# Patient Record
Sex: Female | Born: 1960 | Race: White | Hispanic: No | Marital: Single | Smoking: Former smoker
Health system: Southern US, Community
[De-identification: ages and names within clinical notes are randomized; demographics above are authoritative.]

## PROBLEM LIST (undated history)

## (undated) DIAGNOSIS — G2581 Restless legs syndrome: Secondary | ICD-10-CM

## (undated) DIAGNOSIS — J309 Allergic rhinitis, unspecified: Secondary | ICD-10-CM

## (undated) DIAGNOSIS — T7840XA Allergy, unspecified, initial encounter: Secondary | ICD-10-CM

## (undated) DIAGNOSIS — K219 Gastro-esophageal reflux disease without esophagitis: Secondary | ICD-10-CM

## (undated) DIAGNOSIS — E119 Type 2 diabetes mellitus without complications: Secondary | ICD-10-CM

## (undated) DIAGNOSIS — E039 Hypothyroidism, unspecified: Secondary | ICD-10-CM

## (undated) DIAGNOSIS — F909 Attention-deficit hyperactivity disorder, unspecified type: Secondary | ICD-10-CM

## (undated) DIAGNOSIS — E1140 Type 2 diabetes mellitus with diabetic neuropathy, unspecified: Secondary | ICD-10-CM

## (undated) DIAGNOSIS — I10 Essential (primary) hypertension: Secondary | ICD-10-CM

## (undated) DIAGNOSIS — K259 Gastric ulcer, unspecified as acute or chronic, without hemorrhage or perforation: Secondary | ICD-10-CM

## (undated) HISTORY — DX: Hypothyroidism, unspecified: E03.9

## (undated) HISTORY — DX: Restless legs syndrome: G25.81

## (undated) HISTORY — DX: Allergy, unspecified, initial encounter: T78.40XA

## (undated) HISTORY — DX: Gastro-esophageal reflux disease without esophagitis: K21.9

## (undated) HISTORY — DX: Attention-deficit hyperactivity disorder, unspecified type: F90.9

## (undated) HISTORY — DX: Type 2 diabetes mellitus without complications: E11.9

## (undated) HISTORY — DX: Gastric ulcer, unspecified as acute or chronic, without hemorrhage or perforation: K25.9

## (undated) HISTORY — DX: Allergic rhinitis, unspecified: J30.9

## (undated) HISTORY — DX: Essential (primary) hypertension: I10

## (undated) HISTORY — DX: Type 2 diabetes mellitus with diabetic neuropathy, unspecified: E11.40

---

## 1980-01-21 HISTORY — PX: CHOLECYSTECTOMY: SHX55

## 2000-01-21 HISTORY — PX: ABDOMINAL HYSTERECTOMY: SHX81

## 2005-01-20 LAB — HM COLONOSCOPY: HM Colonoscopy: NORMAL

## 2007-01-21 HISTORY — PX: ACHILLES TENDON REPAIR: SUR1153

## 2009-01-20 HISTORY — PX: ROTATOR CUFF REPAIR: SHX139

## 2013-08-20 LAB — HM MAMMOGRAPHY: HM MAMMO: NORMAL

## 2013-12-10 ENCOUNTER — Other Ambulatory Visit (HOSPITAL_BASED_OUTPATIENT_CLINIC_OR_DEPARTMENT_OTHER): Payer: Self-pay | Admitting: Family Medicine

## 2013-12-10 ENCOUNTER — Ambulatory Visit (HOSPITAL_BASED_OUTPATIENT_CLINIC_OR_DEPARTMENT_OTHER)
Admission: RE | Admit: 2013-12-10 | Discharge: 2013-12-10 | Disposition: A | Discharge status: Home / Self Care | Payer: Managed Care, Other (non HMO) | Source: Ambulatory Visit | Attending: Family Medicine | Admitting: Family Medicine

## 2013-12-10 DIAGNOSIS — X58XXXA Exposure to other specified factors, initial encounter: Secondary | ICD-10-CM | POA: Insufficient documentation

## 2013-12-10 DIAGNOSIS — R52 Pain, unspecified: Secondary | ICD-10-CM

## 2013-12-10 DIAGNOSIS — M7989 Other specified soft tissue disorders: Secondary | ICD-10-CM | POA: Diagnosis present

## 2013-12-10 DIAGNOSIS — R609 Edema, unspecified: Secondary | ICD-10-CM | POA: Diagnosis not present

## 2015-03-09 ENCOUNTER — Telehealth: Payer: Self-pay | Admitting: *Deleted

## 2015-03-09 ENCOUNTER — Encounter: Payer: Self-pay | Admitting: *Deleted

## 2015-03-09 NOTE — Telephone Encounter (Signed)
Called pt back as requested regarding cost of flu shot. LMOVM per pt request informing her (per [place]) that out of pocket cost for flu shot is roughly $62.

## 2015-03-09 NOTE — Telephone Encounter (Signed)
Pre-Visit Call completed with patient and chart updated.   Pre-Visit Info documented in Specialty Comments under Snap[employer]ot.    Cristina Burns-FYI, new pt for 03/12/15 @ 2:30pm. Multiple meds and significant PMH, PSH, and FH. Sees endocrinology for DM management, unclear who is managing hypothyroid. Pt is self-pay.

## 2015-03-12 ENCOUNTER — Encounter: Payer: Self-pay | Admitting: Physician Assistant

## 2015-03-12 ENCOUNTER — Ambulatory Visit (INDEPENDENT_AMBULATORY_CARE_PROVIDER_SITE_OTHER): Payer: Self-pay | Admitting: Physician Assistant

## 2015-03-12 VITALS — BP 130/82 | HR 87 | Temp 98.1°F

## 2015-03-12 DIAGNOSIS — E039 Hypothyroidism, unspecified: Secondary | ICD-10-CM

## 2015-03-12 DIAGNOSIS — I10 Essential (primary) hypertension: Secondary | ICD-10-CM

## 2015-03-12 DIAGNOSIS — E1142 Type 2 diabetes mellitus with diabetic polyneuropathy: Secondary | ICD-10-CM

## 2015-03-12 DIAGNOSIS — Z794 Long term (current) use of insulin: Secondary | ICD-10-CM

## 2015-03-12 MED ORDER — METHYLPHENIDATE HCL ER 36 MG PO TB24: 36.0000 mg | ORAL_TABLET | Freq: Every day | ORAL | Status: DC

## 2015-03-12 MED ORDER — ROPINIROLE HCL 0.5 MG PO TABS: ORAL_TABLET | ORAL | Status: DC

## 2015-03-12 MED ORDER — PANTOPRAZOLE SODIUM 40 MG PO TBEC: 40.0000 mg | DELAYED_RELEASE_TABLET | Freq: Two times a day (BID) | ORAL | Status: DC

## 2015-03-12 MED ORDER — LEVOTHYROXINE SODIUM 125 MCG PO TABS: 62.5000 ug | ORAL_TABLET | Freq: Every day | ORAL | Status: DC

## 2015-03-12 MED ORDER — AMLODIPINE BESYLATE 5 MG PO TABS: 5.0000 mg | ORAL_TABLET | Freq: Every day | ORAL | Status: DC

## 2015-03-12 MED ORDER — TRIAMTERENE-HCTZ 37.5-25 MG PO CAPS
1.0000 | ORAL_CAPSULE | Freq: Every day | ORAL | Status: DC
Start: 2015-03-12 — End: 2016-06-02

## 2015-03-12 MED ORDER — LEVOTHYROXINE SODIUM 137 MCG PO TABS: 68.5000 ug | ORAL_TABLET | Freq: Every day | ORAL | Status: DC

## 2015-03-12 NOTE — Patient Instructions (Signed)
Please continue medications as directed.  Follow-up with your Endocrinologist as scheduled in the next few weeks. Since they are drawing labs I will try to cut down on your cost since you do not have insurance. Have them check your thyroid and fax all lab results to me so I can place in your file.   Follow-up with me in a month. If the specialist has not drawn labs, we will have to check them at next visit.

## 2015-03-12 NOTE — Progress Notes (Signed)
Patient presents to clinic today to establish care.  Chronic Issues: Hypertension -- Currently on regimen of Dyazide and Amlodipine. Patient denies chest pain, palpitations, lightheadedness, dizziness, vision changes or frequent headaches.  BP Readings from Last 3 Encounters:  03/12/15 130/82   ADD -- Has been on Methylphenidate for many years with good relief of symptoms. Denies side effect of medication.  Hypothyroidism -- Followed by Endocrinology. Endorses taking Levothyroxine as directed.  DM II -- Patient endorses being followed by Endocrinology. Is currently on regimen of Toujeo, Amaryl and Glumetza. Is also on Gabapentin for peripheral neuropathy. Denies history of retinopathy or neprhopathy.   Past Medical History  Diagnosis Date  . ADHD (attention deficit hyperactivity disorder)   . Diabetes mellitus without complication ([place])   . Hypothyroidism   . RLS (restless legs syndrome)   . Diabetic neuropathy ([place])   . GERD (gastroesophageal reflux disease)   . Allergic rhinitis   . Stomach ulcer "it's been a long time"  . Hypertension   . Allergy     Past Surgical History  Procedure Laterality Date  . Rotator cuff repair Right 2011  . Cholecystectomy  1982  . Abdominal hysterectomy  2002  . Achilles tendon repair  2009    Current Outpatient Prescriptions on File Prior to Visit  Medication Sig Dispense Refill  . Dulaglutide (TRULICITY [place]) Inject 75 mg into the skin once a week.    . fexofenadine (ALLEGRA) 180 MG tablet Take 180 mg by mouth daily.    Marland Kitchen gabapentin (NEURONTIN) 600 MG tablet Take 300-600 mg by mouth 3 (three) times daily as needed.    Marland Kitchen glimepiride (AMARYL) 4 MG tablet Take 4 mg by mouth daily with breakfast.    . Insulin Glargine (TOUJEO SOLOSTAR [place]) Inject 90 Units into the skin at bedtime.     . metFORMIN (GLUMETZA) 500 MG (MOD) 24 hr tablet Take 2,000 mg by mouth daily with breakfast.    . multivitamin-iron-minerals-folic acid (CENTRUM) chewable  tablet Chew 1 tablet by mouth daily.    Marland Kitchen triamcinolone (NASACORT ALLERGY 24HR) 55 MCG/ACT AERO nasal inhaler Place 1 spray into the nose daily.     No current facility-administered medications on file prior to visit.    Allergies  Allergen Reactions  . Codeine Hives  . Erythromycin     Stomach pain  . Keflex [Cephalexin] Hives    Family History  Problem Relation Age of Onset  . Diabetes Mother   . Breast cancer Mother   . Stroke Mother   . Heart disease Father   . Heart attack Father   . Heart disease Brother     CABG  . Diabetes Brother   . Kidney disease Brother   . Cirrhosis Brother   . Hypertension Brother   . Heart attack Brother   . Heart disease Brother   . Diabetes Brother   . Heart attack Brother     Social History   Social History  . Marital Status: Single    Spouse Name: N/A  . Number of Children: N/A  . Years of Education: N/A   Occupational History  . Not on file.   Social History Main Topics  . Smoking status: Former Smoker -- 2.00 packs/day    Types: Cigarettes    Start date: 06/20/1973    Quit date: 06/21/1982  . Smokeless tobacco: Never Used  . Alcohol Use: No  . Drug Use: No  . Sexual Activity: Not on file   Other Topics Concern  .  Not on file   Social History Narrative   Review of Systems  Constitutional: Negative for fever and malaise/fatigue.  Respiratory: Negative for cough and sputum production.   Cardiovascular: Negative for chest pain and palpitations.  Neurological: Negative for dizziness, loss of consciousness and headaches.  Psychiatric/Behavioral: Negative for depression, suicidal ideas, hallucinations and substance abuse. The patient is not nervous/anxious and does not have insomnia.    BP 130/82 mmHg  Pulse 87  Temp(Src) 98.1 F (36.7 C) (Oral)  Physical Exam  Constitutional: She is oriented to person, place, and time and well-developed, well-nourished, and in no distress.  HENT:  Head: Normocephalic and  atraumatic.  Right Ear: Tympanic membrane normal.  Left Ear: Tympanic membrane normal.  Nose: Nose normal.  Mouth/Throat: Uvula is midline, oropharynx is clear and moist and mucous membranes are normal.  Eyes: Conjunctivae are normal.  Neck: Neck supple. No thyromegaly present.  Cardiovascular: Normal rate, regular rhythm, normal heart sounds and intact distal pulses.   Pulmonary/Chest: Effort normal and breath sounds normal. No respiratory distress. She has no wheezes. She has no rales. She exhibits no tenderness.  Neurological: She is alert and oriented to person, place, and time.  Skin: Skin is warm and dry. No rash noted.  Psychiatric: Affect normal.  Vitals reviewed.  Assessment/Plan: Essential hypertension BP stable. Medications refilled. Continue as directed. Will obtain labs from specialty office.  Hypothyroidism Medication refilled as patient is out. She is to follow-up with Endocrinology for further management and refills.  Diabetes mellitus type II, controlled ([place]) Continue current regimen. Follow-up with speciality as directed. She is to have them fax over all records so EMR can be updated.

## 2015-03-12 NOTE — Progress Notes (Signed)
Pre visit review using our clinic review tool, if applicable. No additional management support is needed unless otherwise documented below in the visit note. 

## 2015-03-18 DIAGNOSIS — E119 Type 2 diabetes mellitus without complications: Secondary | ICD-10-CM | POA: Insufficient documentation

## 2015-03-18 DIAGNOSIS — E039 Hypothyroidism, unspecified: Secondary | ICD-10-CM | POA: Insufficient documentation

## 2015-03-18 DIAGNOSIS — I10 Essential (primary) hypertension: Secondary | ICD-10-CM | POA: Insufficient documentation

## 2015-03-18 NOTE — Assessment & Plan Note (Signed)
BP stable. Medications refilled. Continue as directed. Will obtain labs from specialty office.

## 2015-03-18 NOTE — Assessment & Plan Note (Signed)
Medication refilled as patient is out. She is to follow-up with Endocrinology for further management and refills.

## 2015-03-18 NOTE — Assessment & Plan Note (Signed)
Continue current regimen. Follow-up with speciality as directed. She is to have them fax over all records so EMR can be updated.

## 2015-04-16 ENCOUNTER — Ambulatory Visit (INDEPENDENT_AMBULATORY_CARE_PROVIDER_SITE_OTHER): Payer: Self-pay | Admitting: Physician Assistant

## 2015-04-16 ENCOUNTER — Encounter: Payer: Self-pay | Admitting: Physician Assistant

## 2015-04-16 VITALS — BP 146/82 | HR 109 | Temp 98.3°F | Ht 62.0 in | Wt 194.2 lb

## 2015-04-16 DIAGNOSIS — E1142 Type 2 diabetes mellitus with diabetic polyneuropathy: Secondary | ICD-10-CM

## 2015-04-16 DIAGNOSIS — Z794 Long term (current) use of insulin: Secondary | ICD-10-CM

## 2015-04-16 DIAGNOSIS — E039 Hypothyroidism, unspecified: Secondary | ICD-10-CM

## 2015-04-16 DIAGNOSIS — I10 Essential (primary) hypertension: Secondary | ICD-10-CM

## 2015-04-16 MED ORDER — METHYLPHENIDATE HCL 20 MG PO TABS: 20.0000 mg | ORAL_TABLET | Freq: Two times a day (BID) | ORAL | Status: DC

## 2015-04-16 NOTE — Progress Notes (Signed)
Patient presents to clinic today for follow-up of hypertension. Is taking amlodipine 5 mg, Dyazide 37.5-25 mg and losartan 25 mg as directed. Patient denies chest pain, palpitations, lightheadedness, dizziness, vision changes or frequent headaches.  BP Readings from Last 3 Encounters:  04/16/15 146/82  03/12/15 130/82   Patient would like [place] to take over management of hypothyroidism from her prior Endocrinologist. Endorses taking medications as directed. Endorses she is overdue for repeat thyroid labs. Denies constipation, skin changes, mood changes, etc.  Patient also wants [place] to take over management of Diabetes Mellitus, II. Is currently on regimen of Metformin, Amaryl, Toujeo and Trulicity. Is not checking fasting sugars. Has history of neuropathy for which she taked Gabapentin with good relief of nerve pain.   Past Medical History  Diagnosis Date  . ADHD (attention deficit hyperactivity disorder)   . Diabetes mellitus without complication ([place])   . Hypothyroidism   . RLS (restless legs syndrome)   . Diabetic neuropathy ([place])   . GERD (gastroesophageal reflux disease)   . Allergic rhinitis   . Stomach ulcer "it's been a long time"  . Hypertension   . Allergy     Current Outpatient Prescriptions on File Prior to Visit  Medication Sig Dispense Refill  . amLODipine (NORVASC) 5 MG tablet Take 1 tablet (5 mg total) by mouth daily. 30 tablet 5  . Dulaglutide (TRULICITY ) Inject 75 mg into the skin once a week.    . fexofenadine (ALLEGRA) 180 MG tablet Take 180 mg by mouth daily.    Marland Kitchen FREESTYLE LITE test strip 2 (two) times daily. use for testing  2  . gabapentin (NEURONTIN) 600 MG tablet Take 300-600 mg by mouth 3 (three) times daily as needed.    Marland Kitchen glimepiride (AMARYL) 4 MG tablet Take 4 mg by mouth daily with breakfast.    . Insulin Glargine (TOUJEO SOLOSTAR ) Inject 90 Units into the skin at bedtime.     . multivitamin-iron-minerals-folic acid (CENTRUM) chewable tablet Chew 1  tablet by mouth daily.    . pantoprazole (PROTONIX) 40 MG tablet Take 1 tablet (40 mg total) by mouth 2 (two) times daily. 60 tablet 5  . rOPINIRole (REQUIP) 0.5 MG tablet Take 1 tab in the morning and afternoon. Take 2 tabs at bedtime. 120 tablet 5  . triamcinolone (NASACORT ALLERGY 24HR) 55 MCG/ACT AERO nasal inhaler Place 1 spray into the nose daily.    Marland Kitchen triamterene-hydrochlorothiazide (DYAZIDE) 37.5-25 MG capsule Take 1 each (1 capsule total) by mouth daily. 30 capsule 5   No current facility-administered medications on file prior to visit.    Allergies  Allergen Reactions  . Codeine Hives  . Darvon [Propoxyphene] Hives    "Darvocet"  . Erythromycin     Stomach pain  . Keflex [Cephalexin] Hives    Family History  Problem Relation Age of Onset  . Diabetes Mother   . Breast cancer Mother   . Stroke Mother   . Heart disease Father   . Heart attack Father   . Heart disease Brother     CABG  . Diabetes Brother   . Kidney disease Brother   . Cirrhosis Brother   . Hypertension Brother   . Heart attack Brother   . Heart disease Brother   . Diabetes Brother   . Heart attack Brother     Social History   Social History  . Marital Status: Single    Spouse Name: N/A  . Number of Children: N/A  .  Years of Education: N/A   Social History Main Topics  . Smoking status: Former Smoker -- 2.00 packs/day    Types: Cigarettes    Start date: 06/20/1973    Quit date: 06/21/1982  . Smokeless tobacco: Never Used  . Alcohol Use: No  . Drug Use: No  . Sexual Activity: Not Asked   Other Topics Concern  . None   Social History Narrative    Review of Systems - See HPI.  All other ROS are negative.  BP 146/82 mmHg  Pulse 109  Temp(Src) 98.3 F (36.8 C) (Oral)  Ht '5\' 2"'$  (1.575 m)  Wt 194 lb 3.2 oz (88.089 kg)  BMI 35.51 kg/m2  SpO2 98%  Physical Exam  Constitutional: She is oriented to person, place, and time and well-developed, well-nourished, and in no distress.    HENT:  Head: Normocephalic and atraumatic.  Eyes: Conjunctivae are normal.  Neck: Neck supple.  Cardiovascular: Normal rate, regular rhythm, normal heart sounds and intact distal pulses.   Pulmonary/Chest: Effort normal and breath sounds normal. No respiratory distress. She has no wheezes. She has no rales. She exhibits no tenderness.  Neurological: She is alert and oriented to person, place, and time.  Skin: Skin is warm and dry. No rash noted.  Psychiatric: Affect normal.  Vitals reviewed.   Recent Results (from the past 2160 hour(s))  Comp Met (CMET)     Status: Abnormal   Collection Time: 04/16/15  4:52 PM  Result Value Ref Range   Sodium 139 135 - 145 mEq/L   Potassium 4.5 3.5 - 5.1 mEq/L   Chloride 100 96 - 112 mEq/L   CO2 30 19 - 32 mEq/L   Glucose, Bld 204 (H) 70 - 99 mg/dL   BUN 17 6 - 23 mg/dL   Creatinine, Ser 1.09 0.40 - 1.20 mg/dL   Total Bilirubin 0.4 0.2 - 1.2 mg/dL   Alkaline Phosphatase 114 39 - 117 U/L   AST 81 (H) 0 - 37 U/L   ALT 94 (H) 0 - 35 U/L   Total Protein 8.2 6.0 - 8.3 g/dL   Albumin 4.3 3.5 - 5.2 g/dL   Calcium 10.1 8.4 - 10.5 mg/dL   GFR 55.45 (L) >60.00 mL/min  TSH     Status: Abnormal   Collection Time: 04/16/15  4:52 PM  Result Value Ref Range   TSH 10.74 (H) 0.35 - 4.50 uIU/mL  Hemoglobin A1c     Status: Abnormal   Collection Time: 04/16/15  4:52 PM  Result Value Ref Range   Hgb A1c MFr Bld 9.9 (H) 4.6 - 6.5 %    Comment: Glycemic Control Guidelines for People with Diabetes:Non Diabetic:  <6%Goal of Therapy: <7%Additional Action Suggested:  >8%   Urinalysis, Routine w reflex microscopic (not at [facility])     Status: Abnormal   Collection Time: 04/16/15  4:52 PM  Result Value Ref Range   Color, Urine YELLOW Yellow;Lt. Yellow   APPearance Cloudy (A) Clear   Specific Gravity, Urine 1.025 1.000-1.030   pH 5.5 5.0 - 8.0   Total Protein, Urine 30 (A) Negative   Urine Glucose NEGATIVE Negative   Ketones, ur TRACE (A) Negative   Bilirubin  Urine NEGATIVE Negative   Hgb urine dipstick NEGATIVE Negative   Urobilinogen, UA 0.2 0.0 - 1.0   Leukocytes, UA NEGATIVE Negative   Nitrite NEGATIVE Negative   WBC, UA 0-2/hpf 0-2/hpf   RBC / HPF 0-2/hpf 0-2/hpf   Mucus, UA Presence of (A) None  Squamous Epithelial / LPF Few(5-10/hpf) (A) Rare(0-4/hpf)  Urine Microalbumin w/creat. ratio     Status: Abnormal   Collection Time: 04/16/15  4:52 PM  Result Value Ref Range   Microalb, Ur 20.2 (H) 0.0 - 1.9 mg/dL   Creatinine,U 172.2 mg/dL   Microalb Creat Ratio 11.7 0.0 - 30.0 mg/g  Lipid Profile     Status: Abnormal   Collection Time: 04/16/15  4:52 PM  Result Value Ref Range   Cholesterol 203 (H) 0 - 200 mg/dL    Comment: ATP III Classification       Desirable:  < 200 mg/dL               Borderline High:  200 - 239 mg/dL          High:  > = 240 mg/dL   Triglycerides 187.0 (H) 0.0 - 149.0 mg/dL    Comment: Normal:  <150 mg/dLBorderline High:  150 - 199 mg/dL   HDL 44.00 >39.00 mg/dL   VLDL 37.4 0.0 - 40.0 mg/dL   LDL Cholesterol 122 (H) 0 - 99 mg/dL   Total CHOL/HDL Ratio 5     Comment:                Men          Women1/2 Average Risk     3.4          3.3Average Risk          5.0          4.42X Average Risk          9.6          7.13X Average Risk          15.0          11.0                       NonHDL 159.13     Comment: NOTE:  Non-HDL goal should be 30 mg/dL higher than patient's LDL goal (i.e. LDL goal of < 70 mg/dL, would have non-HDL goal of < 100 mg/dL)    Assessment/Plan: Essential hypertension Will obtain lab panel today. Continue current medication regimen. DASH diet discussed. Patient is to implement. Follow-up will be based on lab results.   Hypothyroidism Will repeat TSH level today to assess if current dose of levothyroxine is sufficient.   Diabetes mellitus type II, controlled ([place]) Will obtain lab panel today. Patient is currently on ARB. Is working on scheduling eye examination. Will alter medication regimen  based on results. She is to resume checking fasting sugars every other day and recording in a journal. Follow-up based on lab results but plan on 1-3 months.

## 2015-04-16 NOTE — Progress Notes (Signed)
Pre visit review using our clinic review tool, if applicable. No additional management support is needed unless otherwise documented below in the visit note. 

## 2015-04-16 NOTE — Patient Instructions (Addendum)
Please go to the lab for blood work. I will call you with your results. Continue current medication regimen as directed.  We can see if the generic short-acting methylphenidate will work as well but will be cheaper. Please take as directed.  Follow-up will be based on results.

## 2015-04-17 LAB — COMPREHENSIVE METABOLIC PANEL
ALBUMIN: 4.3 g/dL (ref 3.5–5.2)
ALT: 94 U/L — AB (ref 0–35)
AST: 81 U/L — AB (ref 0–37)
Alkaline Phosphatase: 114 U/L (ref 39–117)
BUN: 17 mg/dL (ref 6–23)
CALCIUM: 10.1 mg/dL (ref 8.4–10.5)
CHLORIDE: 100 meq/L (ref 96–112)
CO2: 30 mEq/L (ref 19–32)
Creatinine, Ser: 1.09 mg/dL (ref 0.40–1.20)
GFR: 55.45 mL/min — ABNORMAL LOW (ref >60.00)
Glucose, Bld: 204 mg/dL — ABNORMAL HIGH (ref 70–99)
Potassium: 4.5 mEq/L (ref 3.5–5.1)
SODIUM: 139 meq/L (ref 135–145)
Total Bilirubin: 0.4 mg/dL (ref 0.2–1.2)
Total Protein: 8.2 g/dL (ref 6.0–8.3)

## 2015-04-17 LAB — LIPID PANEL
CHOL/HDL RATIO: 5
Cholesterol: 203 mg/dL — ABNORMAL HIGH (ref 0–200)
HDL: 44 mg/dL (ref >39.00)
LDL CALC: 122 mg/dL — AB (ref 0–99)
NONHDL: 159.13
Triglycerides: 187 mg/dL — ABNORMAL HIGH (ref 0.0–149.0)
VLDL: 37.4 mg/dL (ref 0.0–40.0)

## 2015-04-17 LAB — HEMOGLOBIN A1C: Hgb A1c MFr Bld: 9.9 % — ABNORMAL HIGH (ref 4.6–6.5)

## 2015-04-17 LAB — URINALYSIS, ROUTINE W REFLEX MICROSCOPIC
Bilirubin Urine: NEGATIVE
Hgb urine dipstick: NEGATIVE
Leukocytes, UA: NEGATIVE
Nitrite: NEGATIVE
Specific Gravity, Urine: 1.025 (ref 1.000–1.030)
Total Protein, Urine: 30 — AB
Urine Glucose: NEGATIVE
Urobilinogen, UA: 0.2 (ref 0.0–1.0)
pH: 5.5 (ref 5.0–8.0)

## 2015-04-17 LAB — MICROALBUMIN / CREATININE URINE RATIO
Creatinine,U: 172.2 mg/dL
Microalb Creat Ratio: 11.7 mg/g (ref 0.0–30.0)
Microalb, Ur: 20.2 mg/dL — ABNORMAL HIGH (ref 0.0–1.9)

## 2015-04-17 LAB — TSH: TSH: 10.74 u[IU]/mL — AB (ref 0.35–4.50)

## 2015-04-18 ENCOUNTER — Telehealth: Payer: Self-pay | Admitting: Physician Assistant

## 2015-04-18 ENCOUNTER — Other Ambulatory Visit: Payer: Self-pay | Admitting: Physician Assistant

## 2015-04-18 ENCOUNTER — Encounter: Payer: Self-pay | Admitting: Physician Assistant

## 2015-04-18 DIAGNOSIS — R945 Abnormal results of liver function studies: Secondary | ICD-10-CM

## 2015-04-18 DIAGNOSIS — R7989 Other specified abnormal findings of blood chemistry: Secondary | ICD-10-CM

## 2015-04-18 MED ORDER — LEVOTHYROXINE SODIUM 137 MCG PO TABS: 137.0000 ug | ORAL_TABLET | Freq: Every day | ORAL | Status: DC

## 2015-04-18 MED ORDER — METFORMIN HCL ER (MOD) 500 MG PO TB24: 2000.0000 mg | ORAL_TABLET | Freq: Every day | ORAL | Status: DC

## 2015-04-18 MED ORDER — LOSARTAN POTASSIUM 25 MG PO TABS: 12.5000 mg | ORAL_TABLET | Freq: Every day | ORAL | Status: DC

## 2015-04-18 NOTE — Telephone Encounter (Signed)
Spoke with patient concerning results:  (1) UA shows evidence of contamination. Of note protein noted in urine. Urine Microalbumin elevated -- need to start ACE/ARB. Patient endorses cough with lisinopril previously. Will begin losartan 12.5 mg daily. (2) A1C elevated at 9.9. Patient has not been taking Trulicity as directed due to cost. Samples given at yesterday's visit. Will work on Public [employer]. Continue other medications as directed. (3) liver enzymes mildly elevated. Patient endorses history of hepatic steatosis diagnosed on [place] and liver biopsy previously. Will reassess liver function in 2 weeks and get records from previous PMD (4)TSH above goal. Want her to stop regimen given by old PMD and start taking one 137 mcg tablet of levothyroxine daily. Will repeat TSH in 4 weeks.   Medications sent. Labs ordered. Patient will schedule lab and follow-up appts.

## 2015-04-26 NOTE — Assessment & Plan Note (Signed)
Will repeat TSH level today to assess if current dose of levothyroxine is sufficient.

## 2015-04-26 NOTE — Assessment & Plan Note (Signed)
Will obtain lab panel today. Patient is currently on ARB. Is working on scheduling eye examination. Will alter medication regimen based on results. She is to resume checking fasting sugars every other day and recording in a journal. Follow-up based on lab results but plan on 1-3 months.

## 2015-04-26 NOTE — Assessment & Plan Note (Signed)
Will obtain lab panel today. Continue current medication regimen. DASH diet discussed. Patient is to implement. Follow-up will be based on lab results.

## 2015-05-07 ENCOUNTER — Encounter: Payer: Self-pay | Admitting: Physician Assistant

## 2015-05-10 ENCOUNTER — Other Ambulatory Visit (INDEPENDENT_AMBULATORY_CARE_PROVIDER_SITE_OTHER): Payer: Self-pay

## 2015-05-10 DIAGNOSIS — R946 Abnormal results of thyroid function studies: Secondary | ICD-10-CM

## 2015-05-10 DIAGNOSIS — R945 Abnormal results of liver function studies: Secondary | ICD-10-CM

## 2015-05-10 DIAGNOSIS — R7989 Other specified abnormal findings of blood chemistry: Secondary | ICD-10-CM

## 2015-05-11 LAB — HEPATIC FUNCTION PANEL
ALK PHOS: 110 U/L (ref 39–117)
ALT: 142 U/L — AB (ref 0–35)
AST: 215 U/L — ABNORMAL HIGH (ref 0–37)
Albumin: 4.1 g/dL (ref 3.5–5.2)
BILIRUBIN DIRECT: 0.1 mg/dL (ref 0.0–0.3)
TOTAL PROTEIN: 7.7 g/dL (ref 6.0–8.3)
Total Bilirubin: 0.4 mg/dL (ref 0.2–1.2)

## 2015-05-11 LAB — TSH: TSH: 9.33 u[IU]/mL — ABNORMAL HIGH (ref 0.35–4.50)

## 2015-05-11 NOTE — Addendum Note (Signed)
Addended by: Dorrene German on: 05/11/2015 01:51 PM   Modules accepted: Orders

## 2015-05-15 ENCOUNTER — Ambulatory Visit (HOSPITAL_BASED_OUTPATIENT_CLINIC_OR_DEPARTMENT_OTHER)
Admission: RE | Admit: 2015-05-15 | Discharge: 2015-05-15 | Disposition: A | Discharge status: Home / Self Care | Payer: Self-pay | Source: Ambulatory Visit | Attending: Physician Assistant | Admitting: Physician Assistant

## 2015-05-15 ENCOUNTER — Other Ambulatory Visit (INDEPENDENT_AMBULATORY_CARE_PROVIDER_SITE_OTHER): Payer: Self-pay

## 2015-05-15 DIAGNOSIS — R7989 Other specified abnormal findings of blood chemistry: Secondary | ICD-10-CM

## 2015-05-15 DIAGNOSIS — R932 Abnormal findings on diagnostic imaging of liver and biliary tract: Secondary | ICD-10-CM | POA: Insufficient documentation

## 2015-05-15 DIAGNOSIS — Z9049 Acquired absence of other specified parts of digestive tract: Secondary | ICD-10-CM | POA: Insufficient documentation

## 2015-05-15 DIAGNOSIS — R945 Abnormal results of liver function studies: Secondary | ICD-10-CM

## 2015-05-15 DIAGNOSIS — R748 Abnormal levels of other serum enzymes: Secondary | ICD-10-CM

## 2015-05-16 LAB — HEPATITIS PANEL, ACUTE
HCV Ab: NEGATIVE
HEP A IGM: NONREACTIVE
HEP B C IGM: NONREACTIVE
HEP B S AG: NEGATIVE

## 2015-05-23 ENCOUNTER — Other Ambulatory Visit (INDEPENDENT_AMBULATORY_CARE_PROVIDER_SITE_OTHER): Payer: Self-pay

## 2015-05-23 DIAGNOSIS — R945 Abnormal results of liver function studies: Principal | ICD-10-CM

## 2015-05-23 DIAGNOSIS — R7989 Other specified abnormal findings of blood chemistry: Secondary | ICD-10-CM

## 2015-05-24 ENCOUNTER — Other Ambulatory Visit: Payer: Self-pay

## 2015-05-24 LAB — HEPATIC FUNCTION PANEL
ALBUMIN: 4 g/dL (ref 3.5–5.2)
ALT: 155 U/L — ABNORMAL HIGH (ref 0–35)
AST: 173 U/L — ABNORMAL HIGH (ref 0–37)
Alkaline Phosphatase: 108 U/L (ref 39–117)
Bilirubin, Direct: 0.1 mg/dL (ref 0.0–0.3)
TOTAL PROTEIN: 7.5 g/dL (ref 6.0–8.3)
Total Bilirubin: 0.4 mg/dL (ref 0.2–1.2)

## 2015-05-24 NOTE — Addendum Note (Signed)
Addended by: Dorrene German on: 05/24/2015 03:49 PM   Modules accepted: Orders

## 2015-05-25 ENCOUNTER — Encounter: Payer: Self-pay | Admitting: Medical

## 2015-05-25 ENCOUNTER — Telehealth: Payer: Self-pay | Admitting: Family Medicine

## 2015-05-25 ENCOUNTER — Ambulatory Visit (INDEPENDENT_AMBULATORY_CARE_PROVIDER_SITE_OTHER): Payer: Self-pay | Admitting: Medical

## 2015-05-25 VITALS — BP 132/86 | HR 94 | Temp 98.0°F | Resp 16 | Ht 62.0 in | Wt 199.5 lb

## 2015-05-25 DIAGNOSIS — R1013 Epigastric pain: Secondary | ICD-10-CM

## 2015-05-25 DIAGNOSIS — R748 Abnormal levels of other serum enzymes: Secondary | ICD-10-CM

## 2015-05-25 LAB — COMPREHENSIVE METABOLIC PANEL
ALBUMIN: 4 g/dL (ref 3.6–5.1)
ALK PHOS: 109 U/L (ref 33–130)
ALT: 129 U/L — AB (ref 6–29)
AST: 113 U/L — AB (ref 10–35)
BILIRUBIN TOTAL: 0.4 mg/dL (ref 0.2–1.2)
BUN: 14 mg/dL (ref 7–25)
CALCIUM: 9.2 mg/dL (ref 8.6–10.4)
CO2: 23 mmol/L (ref 20–31)
Chloride: 97 mmol/L — ABNORMAL LOW (ref 98–110)
Creat: 0.92 mg/dL (ref 0.50–1.05)
GLUCOSE: 422 mg/dL — AB (ref 65–99)
POTASSIUM: 4.2 mmol/L (ref 3.5–5.3)
Sodium: 134 mmol/L — ABNORMAL LOW (ref 135–146)
TOTAL PROTEIN: 7.1 g/dL (ref 6.1–8.1)

## 2015-05-25 MED ORDER — METFORMIN HCL ER 500 MG PO TB24: 2000.0000 mg | ORAL_TABLET | Freq: Every day | ORAL | Status: DC

## 2015-05-25 NOTE — Progress Notes (Signed)
Pre visit review using our clinic review tool, if applicable. No additional management support is needed unless otherwise documented below in the visit note/SLS  

## 2015-05-25 NOTE — Progress Notes (Signed)
Subjective:    Patient ID: Cristina Burns, female    DOB: 08-04-1960, 55 y.o.   MRN: [id number]  HPI  Pt in having some recent liver enzymes in the past. Pt has some pain on and off in the past in stomach. Pt does not associate pain in abdomen with eating.  Pt at one point thought abdomen pain was related to trulicity but she remembers having pain even before on trulicity. Even for months has abdomen pain.  Pt location of pain epigastric and rt upper quadrant area.   Pt not feeling worse than before. She expresses not knowing why she is here. Pt takes pantoprazole daily.   Pt has appointment with GI on May 15th.   Pt last ate today at lunch.  Abdomen US in April. Some fatty liver found. Pt has no gallbladder.  Review of Systems  Constitutional: Negative for fever, chills and fatigue.  Respiratory: Negative for cough, chest tightness and stridor.   Cardiovascular: Negative for chest pain and palpitations.  Gastrointestinal: Positive for nausea and abdominal pain. Negative for vomiting, diarrhea, constipation, blood in stool, abdominal distention, anal bleeding and rectal pain.       Occaasional nauseau, vomiting and epigastric pain.   Genitourinary: Negative for flank pain.  Neurological: Negative for dizziness and headaches.  Hematological: Negative for adenopathy. Does not bruise/bleed easily.     Past Medical History  Diagnosis Date  . ADHD (attention deficit hyperactivity disorder)   . Diabetes mellitus without complication ([place])   . Hypothyroidism   . RLS (restless legs syndrome)   . Diabetic neuropathy ([place])   . GERD (gastroesophageal reflux disease)   . Allergic rhinitis   . Stomach ulcer "it's been a long time"  . Hypertension   . Allergy      Social History   Social History  . Marital Status: Single    Spouse Name: N/A  . Number of Children: N/A  . Years of Education: N/A   Occupational History  . Not on file.   Social History Main Topics  .  Smoking status: Former Smoker -- 2.00 packs/day    Types: Cigarettes    Start date: 06/20/1973    Quit date: 06/21/1982  . Smokeless tobacco: Never Used  . Alcohol Use: No  . Drug Use: No  . Sexual Activity: Not on file   Other Topics Concern  . Not on file   Social History Narrative    Past Surgical History  Procedure Laterality Date  . Rotator cuff repair Right 2011  . Cholecystectomy  1982  . Abdominal hysterectomy  2002  . Achilles tendon repair  2009    Family History  Problem Relation Age of Onset  . Diabetes Mother   . Breast cancer Mother   . Stroke Mother   . Heart disease Father   . Heart attack Father   . Heart disease Brother     CABG  . Diabetes Brother   . Kidney disease Brother   . Cirrhosis Brother   . Hypertension Brother   . Heart attack Brother   . Heart disease Brother   . Diabetes Brother   . Heart attack Brother     Allergies  Allergen Reactions  . Codeine Hives  . Darvon [Propoxyphene] Hives    "Darvocet"  . Erythromycin     Stomach pain  . Keflex [Cephalexin] Hives    Current Outpatient Prescriptions on File Prior to Visit  Medication Sig Dispense Refill  . amLODipine (NORVASC)  5 MG tablet Take 1 tablet (5 mg total) by mouth daily. 30 tablet 5  . Dulaglutide (TRULICITY [place]) Inject 75 mg into the skin once a week.    . fexofenadine (ALLEGRA) 180 MG tablet Take 180 mg by mouth daily.    Marland Kitchen FREESTYLE LITE test strip 2 (two) times daily. use for testing  2  . gabapentin (NEURONTIN) 600 MG tablet Take 300-600 mg by mouth 3 (three) times daily as needed.    Marland Kitchen glimepiride (AMARYL) 4 MG tablet Take 4 mg by mouth daily with breakfast.    . Insulin Glargine (TOUJEO SOLOSTAR [place]) Inject 90 Units into the skin at bedtime.     Marland Kitchen levothyroxine (SYNTHROID, LEVOTHROID) 137 MCG tablet Take 1 tablet (137 mcg total) by mouth daily before breakfast. 30 tablet 1  . losartan (COZAAR) 25 MG tablet Take 0.5 tablets (12.5 mg total) by mouth daily. 45 tablet 1   . methylphenidate (RITALIN) 20 MG tablet Take 1 tablet (20 mg total) by mouth 2 (two) times daily with breakfast and lunch. 60 tablet 0  . multivitamin-iron-minerals-folic acid (CENTRUM) chewable tablet Chew 1 tablet by mouth daily.    . pantoprazole (PROTONIX) 40 MG tablet Take 1 tablet (40 mg total) by mouth 2 (two) times daily. 60 tablet 5  . rOPINIRole (REQUIP) 0.5 MG tablet Take 1 tab in the morning and afternoon. Take 2 tabs at bedtime. 120 tablet 5  . triamcinolone (NASACORT ALLERGY 24HR) 55 MCG/ACT AERO nasal inhaler Place 1 spray into the nose daily.    Marland Kitchen triamterene-hydrochlorothiazide (DYAZIDE) 37.5-25 MG capsule Take 1 each (1 capsule total) by mouth daily. 30 capsule 5   No current facility-administered medications on file prior to visit.    BP 132/86 mmHg  Pulse 94  Temp(Src) 98 F (36.7 C) (Oral)  Resp 16  Ht 5\' 2"  (1.575 m)  Wt 199 lb 8 oz (90.493 kg)  BMI 36.48 kg/m2  SpO2 98%       Objective:   Physical Exam  General Appearance- Not in acute distress.  HEENT Eyes- Scleraeral/Conjuntiva-bilat- Not Yellow. Mouth & Throat- Normal.  Chest and Lung Exam Auscultation: Breath sounds:-Normal. Adventitious sounds:- No Adventitious sounds.  Cardiovascular Auscultation:Rythm - Regular. Heart Sounds -Normal heart sounds.  Abdomen Inspection:-Inspection Normal.  Palpation/Perucssion: Palpation and Percussion of the abdomen reveal- Non Tender but obese, No Rebound tenderness, No rigidity(Guarding) and No Palpable abdominal masses.  Liver:-Normal.  Spleen:- Normal.        Assessment & Plan:  For you abdomen pain and  liver enzyme elevation I  would advise keeping GI appointment.   Get cbc and cmp. Will evaluate wbc, hb, hct, platletes and lft's.  I talked with pharmacist and limited data. No finding showing trulicity effects liver enzymes. So I think could use and I will send Elyn Aquas PA-C message to see if he agrees or if he would change med.   Advised  pt to continue protonix.  Follow up 7 days or as needed.

## 2015-05-25 NOTE — Telephone Encounter (Signed)
Pt seen on Friday afternoon. I did cbc and cmp. Pt had concern trulicity may be causing liver enzyme elevation. I talked with our pharmacist and no data to support that. Limtied clinical data so far. She did mention 1% of person metformin can effect liver usually first 8 months of metformin use. I think you had already decreased dose on metofrmin? She had stopped trulicity recently and I advised her to restart over weekend. She does have a1-c I think most recently around 9. Maybe a1-c 9.9. Wanted to send this to you to review on this coming Monday. You could give official word on continuing trulicity or not.

## 2015-05-25 NOTE — Patient Instructions (Addendum)
For you abdomen pain and  liver enzyme elevation I  would advise keeping GI appointment.   Get cbc and cmp. Will evaluate wbc, hb, hct, platletes and lft's.  I talked with pharmacist and limited data. No finding showing trulicity effects liver enzymes. So I think could use over weekend and I will send Elyn Aquas PA-C message to see if he agrees or if he would change med.   Advised continue protonix.  Follow up 7 days or as needed.

## 2015-05-25 NOTE — Telephone Encounter (Signed)
rec call from the answering service regarding critical lab  Glucose earlier today was 422 Pt aware- I called her and she said it had been running in the 300s and 400s since she stopped her trulicity  Since it was adv today that that med was not causing problems - I advised her to get back on it and she was agreeable  Her glucose was in the low 300s about 15 minutes ago  I told her if glucose goes over 450 to call us  She stated she was feeling ok  Will cc this to her PCP

## 2015-05-26 NOTE — Telephone Encounter (Signed)
Thank you for making me aware.  Hoyle Sauer, please call Ms Vore to assess. She should also be seeing [place] early this week for follow-up of liver testing unless she saw someone in my absence this past week.

## 2015-05-27 NOTE — Telephone Encounter (Signed)
Will you give pt 30 minute appointment in the am on Monday 8th. I talked with her this weekend. Ok to take both my march appointments. 8:00 and 8:15.

## 2015-05-27 NOTE — Telephone Encounter (Signed)
I talked with pt and I explained best thing may be for her to go to ED. Her bs was 350 when on call nurse talked with her(she just started trulicity). Pt states abdomen pain is about 5/10. Maybe less. No vomiting. The other day pt states when I saw her pain level 2-3/10.  She states she does not want to go to ED since she has no insurance. I advised if she gets any worse then ED. I explained to her that if she remains about the same then I could see her tomorrow in the am. She could call tomorrow at 7 am and schedule for 8 am. But if any worse as described then ED evaluation. Pt expressed understanding.

## 2015-05-28 ENCOUNTER — Ambulatory Visit (INDEPENDENT_AMBULATORY_CARE_PROVIDER_SITE_OTHER): Payer: Self-pay | Admitting: Medical

## 2015-05-28 ENCOUNTER — Encounter: Payer: Self-pay | Admitting: Medical

## 2015-05-28 VITALS — BP 128/84 | HR 76 | Temp 98.0°F | Ht 62.0 in | Wt 198.0 lb

## 2015-05-28 DIAGNOSIS — R109 Unspecified abdominal pain: Secondary | ICD-10-CM

## 2015-05-28 DIAGNOSIS — R748 Abnormal levels of other serum enzymes: Secondary | ICD-10-CM

## 2015-05-28 LAB — COMPREHENSIVE METABOLIC PANEL
ALK PHOS: 106 U/L (ref 39–117)
ALT: 128 U/L — AB (ref 0–35)
AST: 124 U/L — ABNORMAL HIGH (ref 0–37)
Albumin: 4.2 g/dL (ref 3.5–5.2)
BILIRUBIN TOTAL: 0.4 mg/dL (ref 0.2–1.2)
BUN: 20 mg/dL (ref 6–23)
CALCIUM: 9.5 mg/dL (ref 8.4–10.5)
CO2: 27 mEq/L (ref 19–32)
Chloride: 102 mEq/L (ref 96–112)
Creatinine, Ser: 0.86 mg/dL (ref 0.40–1.20)
GFR: 72.86 mL/min (ref >60.00)
Glucose, Bld: 251 mg/dL — ABNORMAL HIGH (ref 70–99)
Potassium: 4.1 mEq/L (ref 3.5–5.1)
Sodium: 138 mEq/L (ref 135–145)
TOTAL PROTEIN: 7.8 g/dL (ref 6.0–8.3)

## 2015-05-28 LAB — CBC WITH DIFFERENTIAL/PLATELET
BASOS ABS: 0.1 10*3/uL (ref 0.0–0.1)
Basophils Relative: 0.7 % (ref 0.0–3.0)
EOS PCT: 3.3 % (ref 0.0–5.0)
Eosinophils Absolute: 0.2 10*3/uL (ref 0.0–0.7)
HEMATOCRIT: 40.7 % (ref 36.0–46.0)
Hemoglobin: 13.7 g/dL (ref 12.0–15.0)
LYMPHS PCT: 38 %
LYMPHS PCT: 33.5 % (ref 12.0–46.0)
Lymphs Abs: 2.5 10*3/uL (ref 0.7–4.0)
MCHC: 33.6 g/dL (ref 30.0–36.0)
MCV: 86.6 fl (ref 78.0–100.0)
MONOS PCT: 7 % (ref 3.0–12.0)
Monocytes Absolute: 0.5 10*3/uL (ref 0.1–1.0)
NEUTROS ABS: 4.1 10*3/uL (ref 1.4–7.7)
Neutrophils Relative %: 55.5 % (ref 43.0–77.0)
PLATELETS: 314 10*3/uL (ref 150.0–400.0)
RBC: 4.7 Mil/uL (ref 3.87–5.11)
RDW: 14.7 % (ref 11.5–15.5)
WBC: 7.4 10*3/uL (ref 4.0–10.5)

## 2015-05-28 MED ORDER — GI COCKTAIL ~~LOC~~
30.0000 mL | Freq: Once | ORAL | Status: AC
  Administered 2015-05-28: 30 mL via ORAL

## 2015-05-28 NOTE — Telephone Encounter (Signed)
Pt has no elevation of infection fighting cells. Liver enzymes about the same as the other day. Keep GI appointment. Follow up with [facility] after she see GI. How did pt respond to gi cocktail? How is she now?

## 2015-05-28 NOTE — Patient Instructions (Addendum)
For your persisting abdomen pain on/off some worse recently over the weekend. Recommend  ct of abdomen/epelvis.  Will need to get cbc today again with cmp. I apologize lab states they did not receive enough blood on friday?  For your diabetes continue the trulicity.  I will try to expedite your referral to GI.  If you pain worsens prior to GI appointment then ED evaluation.  Since you declined ct scan decided to go ahead and give GI cocktail and will follow labs.

## 2015-05-28 NOTE — Progress Notes (Signed)
Pre visit review using our clinic review tool, if applicable. No additional management support is needed unless otherwise documented below in the visit note. 

## 2015-05-28 NOTE — Addendum Note (Signed)
Addended by: Tasia Catchings on: 05/28/2015 09:17 AM   Modules accepted: Orders

## 2015-05-28 NOTE — Telephone Encounter (Signed)
Pt abdomen pain is worsening. Can you ask if gi can see her this week. I am ordering ct abd/pelvis today.

## 2015-05-28 NOTE — Progress Notes (Addendum)
Subjective:    Patient ID: Cristina Burns, female    DOB: July 14, 1960, 55 y.o.   MRN: [id number]  HPI  Pt pain level is little worse than last night.  She estimates 5-6 level pain. No nausea or vomiting. Pain come and goes. Some belching but does that all the time.(Presently pain is on lower side during exam. Level 4)  No black stools and no bloody stools.   Pt did not take any zantac which I did advise her she could take in addition to her pantaprozole. Pt blood sugar this am was 230.  Last bm was on soft side but not loose.  I checked today. Cbc that was drawn on Friday. Not done. I asked staff to call over to lab. They stated not enough blood given.    Review of Systems  Constitutional: Negative for fever, chills and fatigue.  Cardiovascular: Negative for chest pain and palpitations.  Gastrointestinal: Positive for abdominal pain. Negative for blood in stool, abdominal distention and anal bleeding.  Musculoskeletal: Negative for back pain.  Skin: Negative for color change and rash.  Neurological: Negative for dizziness, seizures, syncope, weakness and headaches.  Hematological: Negative for adenopathy. Does not bruise/bleed easily.  Psychiatric/Behavioral: Negative for confusion, dysphoric mood and decreased concentration.    Past Medical History  Diagnosis Date  . ADHD (attention deficit hyperactivity disorder)   . Diabetes mellitus without complication ([place])   . Hypothyroidism   . RLS (restless legs syndrome)   . Diabetic neuropathy ([place])   . GERD (gastroesophageal reflux disease)   . Allergic rhinitis   . Stomach ulcer "it's been a long time"  . Hypertension   . Allergy      Social History   Social History  . Marital Status: Single    Spouse Name: N/A  . Number of Children: N/A  . Years of Education: N/A   Occupational History  . Not on file.   Social History Main Topics  . Smoking status: Former Smoker -- 2.00 packs/day    Types: Cigarettes    Start  date: 06/20/1973    Quit date: 06/21/1982  . Smokeless tobacco: Never Used  . Alcohol Use: No  . Drug Use: No  . Sexual Activity: Not on file   Other Topics Concern  . Not on file   Social History Narrative    Past Surgical History  Procedure Laterality Date  . Rotator cuff repair Right 2011  . Cholecystectomy  1982  . Abdominal hysterectomy  2002  . Achilles tendon repair  2009    Family History  Problem Relation Age of Onset  . Diabetes Mother   . Breast cancer Mother   . Stroke Mother   . Heart disease Father   . Heart attack Father   . Heart disease Brother     CABG  . Diabetes Brother   . Kidney disease Brother   . Cirrhosis Brother   . Hypertension Brother   . Heart attack Brother   . Heart disease Brother   . Diabetes Brother   . Heart attack Brother     Allergies  Allergen Reactions  . Codeine Hives  . Darvon [Propoxyphene] Hives    "Darvocet"  . Erythromycin     Stomach pain  . Keflex [Cephalexin] Hives    Current Outpatient Prescriptions on File Prior to Visit  Medication Sig Dispense Refill  . amLODipine (NORVASC) 5 MG tablet Take 1 tablet (5 mg total) by mouth daily. 30 tablet 5  .  Dulaglutide (TRULICITY ) Inject 75 mg into the skin once a week.    . fexofenadine (ALLEGRA) 180 MG tablet Take 180 mg by mouth daily.    Marland Kitchen FREESTYLE LITE test strip 2 (two) times daily. use for testing  2  . gabapentin (NEURONTIN) 600 MG tablet Take 300-600 mg by mouth 3 (three) times daily as needed.    Marland Kitchen glimepiride (AMARYL) 4 MG tablet Take 4 mg by mouth daily with breakfast.    . Insulin Glargine (TOUJEO SOLOSTAR ) Inject 90 Units into the skin at bedtime.     Marland Kitchen levothyroxine (SYNTHROID, LEVOTHROID) 137 MCG tablet Take 1 tablet (137 mcg total) by mouth daily before breakfast. 30 tablet 1  . losartan (COZAAR) 25 MG tablet Take 0.5 tablets (12.5 mg total) by mouth daily. 45 tablet 1  . metFORMIN (GLUCOPHAGE-XR) 500 MG 24 hr tablet Take 4 tablets (2,000 mg  total) by mouth daily with supper.    . methylphenidate (RITALIN) 20 MG tablet Take 1 tablet (20 mg total) by mouth 2 (two) times daily with breakfast and lunch. 60 tablet 0  . multivitamin-iron-minerals-folic acid (CENTRUM) chewable tablet Chew 1 tablet by mouth daily.    . pantoprazole (PROTONIX) 40 MG tablet Take 1 tablet (40 mg total) by mouth 2 (two) times daily. 60 tablet 5  . rOPINIRole (REQUIP) 0.5 MG tablet Take 1 tab in the morning and afternoon. Take 2 tabs at bedtime. 120 tablet 5  . triamcinolone (NASACORT ALLERGY 24HR) 55 MCG/ACT AERO nasal inhaler Place 1 spray into the nose daily.    Marland Kitchen triamterene-hydrochlorothiazide (DYAZIDE) 37.5-25 MG capsule Take 1 each (1 capsule total) by mouth daily. 30 capsule 5   No current facility-administered medications on file prior to visit.    BP 128/84 mmHg  Pulse 76  Temp(Src) 98 F (36.7 C) (Oral)  Ht 5\' 2"  (1.575 m)  Wt 198 lb (89.812 kg)  BMI 36.21 kg/m2  SpO2 96%       Objective:   Physical Exam  General Appearance- Not in acute distress.(pt reports pain but does not appear to be in a whole of pain.) Pt states deals with pain pretty well.  HEENT Eyes- Scleraeral/Conjuntiva-bilat- Not Yellow. Mouth & Throat- Normal.  Chest and Lung Exam Auscultation: Breath sounds:-Normal. Adventitious sounds:- No Adventitious sounds.  Cardiovascular Auscultation:Rythm - Regular. Heart Sounds -Normal heart sounds.  Abdomen Inspection:-Inspection Normal.  Palpation/Perucssion: Palpation and Percussion of the abdomen reveal-  Tender mid abdomen pain above umilicus, No Rebound tenderness, No rigidity(Guarding) and No Palpable abdominal masses.  Liver:-Normal.  Spleen:- Normal.   Back- no cva tenderness     Assessment & Plan:  For your persisting abdomen pain on/off some worse recently over the weekend. Recommend  ct of abdomen/epelvis.  Will need to get cbc today again with cmp. I apologize lab states they did not receive enough  blood on friday?  For your diabetes continue the trulicity.  I will try to expedite your referral to GI.  If you pain worsens prior to GI appointment then ED evaluation.  Since you declined ct scan decided to go ahead and give GI cocktail and will follow labs.   Pt was educated on benefit of getting CT. She did not want to get done based on potential cost. She states the [place] she got the other day cost er $300 dollars. And she states can't afford ct.   She informed me of this after I arranged for her to have it done stat  Jahir Halt, Percell Sam, PA-C

## 2015-05-29 NOTE — Progress Notes (Signed)
Quick Note:  Pt has seen results on MyChart and message also sent for patient to call back if any questions. ______ 

## 2015-05-30 ENCOUNTER — Encounter: Payer: Self-pay | Admitting: Physician Assistant

## 2015-05-30 NOTE — Telephone Encounter (Signed)
Spoke with pt and she is concerned about the cost of the CT scan and she wants to get the scan completed and I advised her that I spoke with Hoyle Sauer in imaging and carolyn stated that the pt would be put in as self pay and then the billing office would call the pt and contact her with a payment plan. Pt voices understanding.

## 2015-05-31 ENCOUNTER — Other Ambulatory Visit: Payer: Self-pay | Admitting: Physician Assistant

## 2015-05-31 ENCOUNTER — Ambulatory Visit (HOSPITAL_BASED_OUTPATIENT_CLINIC_OR_DEPARTMENT_OTHER)
Admission: RE | Admit: 2015-05-31 | Discharge: 2015-05-31 | Disposition: A | Discharge status: Home / Self Care | Payer: Self-pay | Source: Ambulatory Visit | Attending: Medical | Admitting: Medical

## 2015-05-31 DIAGNOSIS — N83201 Unspecified ovarian cyst, right side: Secondary | ICD-10-CM | POA: Insufficient documentation

## 2015-05-31 DIAGNOSIS — R109 Unspecified abdominal pain: Secondary | ICD-10-CM | POA: Insufficient documentation

## 2015-05-31 DIAGNOSIS — K760 Fatty (change of) liver, not elsewhere classified: Secondary | ICD-10-CM | POA: Insufficient documentation

## 2015-05-31 MED ORDER — IOPAMIDOL (ISOVUE-300) INJECTION 61%
100.0000 mL | Freq: Once | INTRAVENOUS | Status: AC | PRN
  Administered 2015-05-31: 100 mL via INTRAVENOUS

## 2015-05-31 NOTE — Telephone Encounter (Signed)
Please advise if the pt will need to see GI if she does the CT scan due to financial situation.

## 2015-06-01 ENCOUNTER — Other Ambulatory Visit (HOSPITAL_BASED_OUTPATIENT_CLINIC_OR_DEPARTMENT_OTHER): Payer: Self-pay

## 2015-06-04 ENCOUNTER — Other Ambulatory Visit (INDEPENDENT_AMBULATORY_CARE_PROVIDER_SITE_OTHER): Payer: Self-pay

## 2015-06-04 ENCOUNTER — Encounter: Payer: Self-pay | Admitting: Gastroenterology

## 2015-06-04 ENCOUNTER — Ambulatory Visit (INDEPENDENT_AMBULATORY_CARE_PROVIDER_SITE_OTHER): Payer: Self-pay | Admitting: Gastroenterology

## 2015-06-04 VITALS — BP 126/74 | HR 76 | Ht 62.0 in | Wt 196.0 lb

## 2015-06-04 DIAGNOSIS — R7989 Other specified abnormal findings of blood chemistry: Secondary | ICD-10-CM

## 2015-06-04 DIAGNOSIS — R945 Abnormal results of liver function studies: Principal | ICD-10-CM

## 2015-06-04 DIAGNOSIS — R1013 Epigastric pain: Secondary | ICD-10-CM

## 2015-06-04 DIAGNOSIS — K219 Gastro-esophageal reflux disease without esophagitis: Secondary | ICD-10-CM

## 2015-06-04 LAB — HEPATIC FUNCTION PANEL
ALT: 98 U/L — AB (ref 0–35)
AST: 76 U/L — AB (ref 0–37)
Albumin: 4.3 g/dL (ref 3.5–5.2)
Alkaline Phosphatase: 117 U/L (ref 39–117)
BILIRUBIN DIRECT: 0.2 mg/dL (ref 0.0–0.3)
BILIRUBIN TOTAL: 0.5 mg/dL (ref 0.2–1.2)
Total Protein: 7.7 g/dL (ref 6.0–8.3)

## 2015-06-04 LAB — IBC PANEL
Iron: 70 ug/dL (ref 42–145)
Saturation Ratios: 14.7 % — ABNORMAL LOW (ref 20.0–50.0)
TRANSFERRIN: 340 mg/dL (ref 212.0–360.0)

## 2015-06-04 LAB — FERRITIN: FERRITIN: 62.4 ng/mL (ref 10.0–291.0)

## 2015-06-04 LAB — PROTIME-INR
INR: 1.1 ratio — AB (ref 0.8–1.0)
PROTHROMBIN TIME: 11.4 s (ref 9.6–13.1)

## 2015-06-04 NOTE — Patient Instructions (Signed)
Your physician has requested that you go to the basement for lab work before leaving today.  Follow up with your primary care physician for diabetes management.   Patient advised to avoid spicy, acidic, citrus, chocolate, mints, fruit and fruit juices.  Limit the intake of caffeine, alcohol and Soda.  Don't exercise too soon after eating.  Don't lie down within 3-4 hours of eating.  Elevate the head of your bed.  Normal BMI (Body Mass Index- based on height and weight) is between 19 and 25. Your BMI today is Body mass index is 35.84 kg/(m^2). Marland Kitchen Please consider follow up  regarding your BMI with your Primary Care Provider.  Thank you for choosing me and [facility].  Pricilla Riffle. Dagoberto Ligas., MD., Marval Regal

## 2015-06-04 NOTE — Progress Notes (Signed)
    History of Present Illness: This is a 55 year old female referred by Brunetta Jeans, PA-C for the evaluation of epigastric pain, elevated LFTs. Patient relates mild epigastric pain intermittently for the past several weeks and occasional right upper quadrant pain. Her pain tends to last minutes at a time and she describes it as sharp. It does not appear to correlate with meals, bowel movements or any digestive function. It does not appear to be related to movement, standing or bending. Her LFTs were noted to be elevated and I have reviewed blood work in [facility] since 04/16/2015. Diabetic control has been poor. Most recent lab work shows blood glucose of 251 and 422. Acute hepatitis panel was negative in April 2017. CT and ultrasound imaging as below. She has chronic GERD that is fairly well controlled on pantoprazole bid. Denies weight loss, constipation, diarrhea, change in stool caliber, melena, hematochezia, nausea, vomiting, dysphagia, reflux symptoms, chest pain.  Abd/pelvic 05/31/15 CT IMPRESSION: Cannot exclude mild distal esophagitis. Wall thickening distal esophagus. Benign-appearing right ovarian cyst which does not require follow-up imaging. This recommendation follows ACR consensus guidelines: White Paper of the ACR Incidental Findings Committee II on Adnexal Findings. J Am Coll Radiol [phone]. No acute findings in the abdomen or pelvis. Mild hepatic steatosis.  Abd [place] 05/15/15 IMPRESSION: Diffusely increased hepatic parenchymal echogenicity compatible with steatosis. Prior cholecystectomy. No biliary ductal dilatation. Common bile duct: Diameter: 4 mm  Review of Systems: Pertinent positive and negative review of systems were noted in the above HPI section. All other review of systems were otherwise negative.  Current Medications, Allergies, Past Medical History, Past Surgical History, Family History and Social History were reviewed in [employer]  record.  Physical Exam: General: Well developed, well nourished, obese, no acute distress Head: Normocephalic and atraumatic Eyes:  sclerae anicteric, EOMI Ears: Normal auditory acuity Mouth: No deformity or lesions Neck: Supple, no masses or thyromegaly Lungs: Clear throughout to auscultation Heart: Regular rate and rhythm; no murmurs, rubs or bruits Abdomen: Soft, non tender and non distended. No masses, hepatosplenomegaly or hernias noted. Normal Bowel sounds Musculoskeletal: Symmetrical with no gross deformities  Skin: No lesions on visible extremities Pulses:  Normal pulses noted Extremities: No clubbing, cyanosis, edema or deformities noted Neurological: Alert oriented x 4, grossly nonfocal Cervical Nodes:  No significant cervical adenopathy Inguinal Nodes: No significant inguinal adenopathy Psychological:  Alert and cooperative. Normal mood and affect  Assessment and Recommendations:  1. GERD, epigastric pain, RUQ pain. The etiology of her epigastric pain and right upper quadrant pain is not clear. Choledocholithiasis is unlikely with a normal caliber common bile duct measured only 4 mm. Abdominal pain could be secondary to inadequately controlled diabetes leading to delayed gastric emptying and worsening reflux. Continue pantoprazole 40 mg twice daily. Strictly follow antireflux measures. Consider EGD and MRCP if symptoms do not improve with improved control of diabetes. REV in 2 months.   2. Elevated LFTs. Hepatic steatosis noted on imaging. Elevated LFTs may be secondary to this however they are higher than typically noted. R/O other etiologies. Obtain additional standard hepatic serologies. Trend LFTs. Improved control of diabetes mellitus is essential for optimal mgmt of hepatic steatosis. REV in 2 months.   3. Diabetes mellitus, poor control. Return to PCP for further management.   cc: Brunetta Jeans, PA-C [address] [place], [place] [address]

## 2015-06-05 LAB — MITOCHONDRIAL ANTIBODIES: Mitochondrial M2 Ab, IgG: <=20 Units (ref <=20.0)

## 2015-06-05 LAB — ANTI-SMOOTH MUSCLE ANTIBODY, IGG

## 2015-06-06 LAB — ANA: Anti Nuclear Antibody(ANA): POSITIVE — AB

## 2015-06-06 LAB — ALPHA-1-ANTITRYPSIN: A1 ANTITRYPSIN SER: 165 mg/dL (ref 83–199)

## 2015-06-06 LAB — ANTI-NUCLEAR AB-TITER (ANA TITER): ANA Titer 1: 1:320 {titer} — ABNORMAL HIGH

## 2015-06-06 LAB — CERULOPLASMIN: CERULOPLASMIN: 30 mg/dL (ref 18–53)

## 2015-06-06 MED ORDER — METHYLPHENIDATE HCL 20 MG PO TABS: 20.0000 mg | ORAL_TABLET | Freq: Two times a day (BID) | ORAL | Status: DC

## 2015-06-06 NOTE — Addendum Note (Signed)
Addended by: Raiford Noble on: 06/06/2015 07:51 AM   Modules accepted: Orders

## 2015-06-06 NOTE — Telephone Encounter (Signed)
Patient informed Rx ready for p/u during regular business hours/SLS 05/17

## 2015-06-07 ENCOUNTER — Other Ambulatory Visit (INDEPENDENT_AMBULATORY_CARE_PROVIDER_SITE_OTHER): Payer: Self-pay

## 2015-06-07 DIAGNOSIS — E039 Hypothyroidism, unspecified: Secondary | ICD-10-CM

## 2015-06-08 LAB — TSH: TSH: 8.84 u[IU]/mL — AB (ref 0.35–4.50)

## 2015-06-12 ENCOUNTER — Encounter: Payer: Self-pay | Admitting: Physician Assistant

## 2015-06-12 ENCOUNTER — Ambulatory Visit: Payer: Self-pay | Admitting: Gastroenterology

## 2015-06-12 ENCOUNTER — Telehealth: Payer: Self-pay | Admitting: *Deleted

## 2015-06-12 DIAGNOSIS — E039 Hypothyroidism, unspecified: Secondary | ICD-10-CM

## 2015-06-12 MED ORDER — LEVOTHYROXINE SODIUM 150 MCG PO TABS: 150.0000 ug | ORAL_TABLET | Freq: Every day | ORAL | Status: DC

## 2015-06-12 MED ORDER — GLIMEPIRIDE 4 MG PO TABS: 4.0000 mg | ORAL_TABLET | Freq: Every day | ORAL | Status: DC

## 2015-06-12 NOTE — Telephone Encounter (Signed)
-----   Message from Brunetta Jeans, PA-C sent at 06/10/2015 12:52 PM EDT ----- TSH still not coming down enough. Need to increase levothyroxine to 150 mcg daily. Repeat TSH in 2 months. Send in 30 days supply with 1 refill

## 2015-06-12 NOTE — Telephone Encounter (Signed)
Called and spoke with the pt and informed her of recent lab results and note.  Pt verbalized understanding and agreed.  New prescription sent to the pharmacy by e-script.  Future lab ordered and sent.  Pt scheduled a 2 month lab appt for repeat TSH for (Mon-08/13/15 @ 4:00pm).//AB/CMA

## 2015-06-19 ENCOUNTER — Telehealth: Payer: Self-pay | Admitting: Physician Assistant

## 2015-06-19 NOTE — Telephone Encounter (Signed)
Relation to [id number] Call back [place]   Reason for call:  Patient dropped off [place] and [employer] patient assistance applications for PCP to fill out. Placed in [place]. Mailbox. Patient would like completed forms faxed and a copy mailed to home.

## 2015-06-20 NOTE — Telephone Encounter (Signed)
Filled out as much as possible and forwarded to [place]. JG//CMA

## 2015-06-22 NOTE — Telephone Encounter (Signed)
Called and informed pt of information below. JG//CMA

## 2015-06-22 NOTE — Telephone Encounter (Signed)
Completed information faxed to [facility] at [phone] successfully.  Completed information faxed to [facility] at [phone] successfully. Originals mailed to pt as requested, copies of forms sent for scanning. JG//CMA

## 2015-06-28 ENCOUNTER — Encounter: Payer: Self-pay | Admitting: Physician Assistant

## 2015-06-29 ENCOUNTER — Telehealth: Payer: Self-pay | Admitting: *Deleted

## 2015-06-29 NOTE — Telephone Encounter (Signed)
Patient informed Toujeo pen [x6] Approved and arrived at office; Trulicity denied via [place] d/t "the patient does not meet the financial criteria of the program" [will give samples], understood & agreed to p/u during regular business hours/SLS 06/09

## 2015-06-29 NOTE — Telephone Encounter (Signed)
Received faxes from [facility] Patient connection &

## 2015-07-17 ENCOUNTER — Ambulatory Visit: Payer: Self-pay | Admitting: Physician Assistant

## 2015-07-18 ENCOUNTER — Encounter: Payer: Self-pay | Admitting: Physician Assistant

## 2015-07-18 NOTE — Telephone Encounter (Signed)
Refill request for Methylphenidate 20mg BID Last filled by MD on - 06/06/15, #60x0 Last AEX - 04/16/15 [Acutes on 05/05 & 05/08] Next AEX - 07/25/15 Rx pending for signature & I will inform patient via My Chart when ready/SLS 06/28

## 2015-07-19 MED ORDER — METHYLPHENIDATE HCL 20 MG PO TABS: 20.0000 mg | ORAL_TABLET | Freq: Two times a day (BID) | ORAL | Status: DC

## 2015-07-19 NOTE — Telephone Encounter (Signed)
Per Dr. Larose Kells, okay to RF x1. Rx printed, awaiting MD signature.

## 2015-07-19 NOTE — Telephone Encounter (Signed)
Pt informed via MyChart that Rx's have been placed at front desk for pick up.  

## 2015-07-25 ENCOUNTER — Ambulatory Visit (INDEPENDENT_AMBULATORY_CARE_PROVIDER_SITE_OTHER): Payer: Self-pay | Admitting: Physician Assistant

## 2015-07-25 ENCOUNTER — Encounter: Payer: Self-pay | Admitting: Physician Assistant

## 2015-07-25 VITALS — BP 128/78 | HR 107 | Temp 98.0°F | Resp 16 | Ht 62.0 in | Wt 195.1 lb

## 2015-07-25 DIAGNOSIS — E1165 Type 2 diabetes mellitus with hyperglycemia: Secondary | ICD-10-CM

## 2015-07-25 DIAGNOSIS — IMO0002 Reserved for concepts with insufficient information to code with codable children: Secondary | ICD-10-CM

## 2015-07-25 DIAGNOSIS — E118 Type 2 diabetes mellitus with unspecified complications: Secondary | ICD-10-CM

## 2015-07-25 DIAGNOSIS — E039 Hypothyroidism, unspecified: Secondary | ICD-10-CM

## 2015-07-25 DIAGNOSIS — Z794 Long term (current) use of insulin: Secondary | ICD-10-CM

## 2015-07-25 DIAGNOSIS — R748 Abnormal levels of other serum enzymes: Secondary | ICD-10-CM

## 2015-07-25 NOTE — Progress Notes (Signed)
Patient presents to clinic today for follow-up of hypothyroidism and elevated liver enzymes.  Patient currently on levothyroxine 150 mcg daily. Dose was increased at last visit due to abnormal TSH level on prior dose. Endorses taking medications as directed. Is due for recheck today.   On recent labs, liver enzymes noted to be elevated. Patient was set up with GI for further assessment. US obtained by GI revealed hepatic steatosis. 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 Liver function improved at check at GI. FU scheduled. Patient endorses wanting labs rechecked today to verify liver enzymes are continuing to improve.  Past Medical History  Diagnosis Date  . ADHD (attention deficit hyperactivity disorder)   . Diabetes mellitus without complication ([place])   . Hypothyroidism   . RLS (restless legs syndrome)   . Diabetic neuropathy ([place])   . GERD (gastroesophageal reflux disease)   . Allergic rhinitis   . Stomach ulcer "it's been a long time"  . Hypertension   . Allergy     Current Outpatient Prescriptions on File Prior to Visit  Medication Sig Dispense Refill  . amLODipine (NORVASC) 5 MG tablet Take 1 tablet (5 mg total) by mouth daily. 30 tablet 5  . Dulaglutide (TRULICITY [place]) Inject 75 mg into the skin once a week.    Marland Kitchen FREESTYLE LITE test strip 2 (two) times daily. use for testing  2  . gabapentin (NEURONTIN) 600 MG tablet Take 300-600 mg by mouth 3 (three) times daily as needed.    Marland Kitchen glimepiride (AMARYL) 4 MG tablet Take 1 tablet (4 mg total) by mouth daily with breakfast. 30 tablet 5  . Insulin Glargine (TOUJEO SOLOSTAR [place]) Inject 90 Units into the skin at bedtime.     Marland Kitchen levothyroxine (SYNTHROID) 150 MCG tablet Take 1 tablet (150 mcg total) by mouth daily before breakfast. 30 tablet 1  . losartan (COZAAR) 25 MG tablet Take 0.5 tablets (12.5 mg total) by mouth daily. 45 tablet 1  . metFORMIN (GLUCOPHAGE-XR) 500 MG 24 hr tablet Take 4 tablets (2,000 mg total) by  mouth daily with supper.    . methylphenidate (RITALIN) 20 MG tablet Take 1 tablet (20 mg total) by mouth 2 (two) times daily with breakfast and lunch. 60 tablet 0  . multivitamin-iron-minerals-folic acid (CENTRUM) chewable tablet Chew 1 tablet by mouth daily.    . pantoprazole (PROTONIX) 40 MG tablet Take 1 tablet (40 mg total) by mouth 2 (two) times daily. 60 tablet 5  . rOPINIRole (REQUIP) 0.5 MG tablet Take 1 tab in the morning and afternoon. Take 2 tabs at bedtime. 120 tablet 5  . triamcinolone (NASACORT ALLERGY 24HR) 55 MCG/ACT AERO nasal inhaler Place 1 spray into the nose daily.    Marland Kitchen triamterene-hydrochlorothiazide (DYAZIDE) 37.5-25 MG capsule Take 1 each (1 capsule total) by mouth daily. 30 capsule 5   No current facility-administered medications on file prior to visit.    Allergies  Allergen Reactions  . Codeine Hives  . Darvon [Propoxyphene] Hives    "Darvocet"  . Erythromycin     Stomach pain  . Keflex [Cephalexin] Hives    Family History  Problem Relation Age of Onset  . Diabetes Mother   . Breast cancer Mother   . Stroke Mother   . Heart disease Father   . Heart attack Father   . Heart disease Brother     CABG  . Diabetes Brother   . Kidney disease Brother   . Cirrhosis Brother   . Hypertension Brother   .  Heart attack Brother   . Heart disease Brother   . Diabetes Brother   . Heart attack Brother     Social History   Social History  . Marital Status: Single    Spouse Name: N/A  . Number of Children: 2  . Years of Education: N/A   Social History Main Topics  . Smoking status: Former Smoker -- 2.00 packs/day    Types: Cigarettes    Start date: 06/20/1973    Quit date: 06/21/1982  . Smokeless tobacco: Never Used  . Alcohol Use: No  . Drug Use: No  . Sexual Activity: Not Asked   Other Topics Concern  . None   Social History Narrative    Review of Systems - See HPI.  All other ROS are negative.  BP 128/78 mmHg  Pulse 107  Temp(Src) 98 F  (36.7 C) (Oral)  Resp 16  Ht _0  (1.575 m)  Wt 195 lb 2 oz (88.508 kg)  BMI 35.68 kg/m2  SpO2 98%  Physical Exam  Constitutional: She is well-developed, well-nourished, and in no distress.  HENT:  Head: Normocephalic.  Eyes: Conjunctivae are normal.  Neck: Neck supple.  Cardiovascular: Normal rate, regular rhythm, normal heart sounds and intact distal pulses.   Pulmonary/Chest: Effort normal and breath sounds normal. No respiratory distress. She has no wheezes. She has no rales. She exhibits no tenderness.  Abdominal: Soft. Bowel sounds are normal. She exhibits no distension and no mass. There is no tenderness. There is no rebound and no guarding.  Skin: Skin is warm and dry. No rash noted.  Psychiatric: Affect normal.  Vitals reviewed.   Recent Results (from the past 2160 hour(s))  TSH     Status: Abnormal   Collection Time: 05/10/15  4:10 PM  Result Value Ref Range   TSH 9.33 (H) 0.35 - 4.50 uIU/mL  Hepatic function panel     Status: Abnormal   Collection Time: 05/10/15  4:10 PM  Result Value Ref Range   Total Bilirubin 0.4 0.2 - 1.2 mg/dL   Bilirubin, Direct 0.1 0.0 - 0.3 mg/dL   Alkaline Phosphatase 110 39 - 117 U/L   AST 215 (H) 0 - 37 U/L   ALT 142 (H) 0 - 35 U/L   Total Protein 7.7 6.0 - 8.3 g/dL   Albumin 4.1 3.5 - 5.2 g/dL  Hepatitis panel, acute     Status: None   Collection Time: 05/15/15  4:04 PM  Result Value Ref Range   Hepatitis B Surface Ag NEGATIVE NEGATIVE   HCV Ab NEGATIVE NEGATIVE   Hep B C IgM NON REACTIVE NON REACTIVE    Comment: High levels of Hepatitis B Core IgM antibody are detectable during the acute stage of Hepatitis B. This antibody is used to differentiate current from past HBV infection.      Hep A IgM NON REACTIVE NON REACTIVE    Comment:   Effective December 05, 2013, Hepatitis Acute Panel (test code [id number]) will be revised to automatically reflex to the Hepatitis C Viral RNA, Quantitative, Real-Time PCR assay if the Hepatitis C  antibody screening result is Reactive. This action is being taken to ensure that the CDC/USPSTF recommended HCV diagnostic algorithm with the appropriate test reflex needed for accurate interpretation is followed.     Hepatic function panel     Status: Abnormal   Collection Time: 05/23/15  3:48 PM  Result Value Ref Range   Total Bilirubin 0.4 0.2 - 1.2 mg/dL   Bilirubin,  Direct 0.1 0.0 - 0.3 mg/dL   Alkaline Phosphatase 108 39 - 117 U/L   AST 173 (H) 0 - 37 U/L   ALT 155 (H) 0 - 35 U/L   Total Protein 7.5 6.0 - 8.3 g/dL   Albumin 4.0 3.5 - 5.2 g/dL  CBC w/Diff     Status: None   Collection Time: 05/25/15  5:04 PM  Result Value Ref Range   WBC CANCELED 3.8 - 10.8 K/uL    Comment: Result canceled by the ancillary   RBC CANCELED 3.80 - 5.10 MIL/uL    Comment: Result canceled by the ancillary   Hemoglobin CANCELED 11.7 - 15.5 g/dL    Comment: Result canceled by the ancillary   HCT CANCELED 35.0 - 45.0 %    Comment: Result canceled by the ancillary   MCV CANCELED 80.0 - 100.0 fL    Comment: Result canceled by the ancillary   MCH CANCELED 27.0 - 33.0 pg    Comment: Result canceled by the ancillary   MCHC CANCELED 32.0 - 36.0 g/dL    Comment: Result canceled by the ancillary   RDW CANCELED 11.0 - 15.0 %    Comment: Result canceled by the ancillary   Platelets CANCELED 140 - 400 K/uL    Comment: Result canceled by the ancillary   MPV CANCELED 7.5 - 12.5 fL    Comment: Result canceled by the ancillary   Neutro Abs CANCELED 1500 - 7800 cells/uL    Comment: Result canceled by the ancillary   Lymphs Abs CANCELED 850 - 3900 cells/uL    Comment: Result canceled by the ancillary   Monocytes Absolute CANCELED 200 - 950 cells/uL    Comment: Result canceled by the ancillary   Eosinophils Absolute CANCELED 15 - 500 cells/uL    Comment: Result canceled by the ancillary   Basophils Absolute CANCELED 0 - 200 cells/uL    Comment: Result canceled by the ancillary   Neutrophils Relative %  CANCELED %    Comment: Result canceled by the ancillary   Lymphocytes Relative 38 %   Monocytes Relative CANCELED %    Comment: Result canceled by the ancillary   Eosinophils Relative CANCELED %    Comment: Result canceled by the ancillary   Basophils Relative CANCELED %    Comment: Result canceled by the ancillary   Smear Review CANCELED     Comment: ** Please note change in unit of measure and reference range(s). **  Result canceled by the ancillary   Comprehensive metabolic panel     Status: Abnormal   Collection Time: 05/25/15  5:04 PM  Result Value Ref Range   Sodium 134 (L) 135 - 146 mmol/L   Potassium 4.2 3.5 - 5.3 mmol/L   Chloride 97 (L) 98 - 110 mmol/L   CO2 23 20 - 31 mmol/L   Glucose, Bld 422 (H) 65 - 99 mg/dL    Comment: Result repeated and verified.   BUN 14 7 - 25 mg/dL   Creat 0.92 0.50 - 1.05 mg/dL   Total Bilirubin 0.4 0.2 - 1.2 mg/dL   Alkaline Phosphatase 109 33 - 130 U/L   AST 113 (H) 10 - 35 U/L   ALT 129 (H) 6 - 29 U/L   Total Protein 7.1 6.1 - 8.1 g/dL   Albumin 4.0 3.6 - 5.1 g/dL   Calcium 9.2 8.6 - 10.4 mg/dL  CBC w/Diff     Status: None   Collection Time: 05/28/15  9:00 AM  Result  Value Ref Range   WBC 7.4 4.0 - 10.5 K/uL   RBC 4.70 3.87 - 5.11 Mil/uL   Hemoglobin 13.7 12.0 - 15.0 g/dL   HCT 40.7 36.0 - 46.0 %   MCV 86.6 78.0 - 100.0 fl   MCHC 33.6 30.0 - 36.0 g/dL   RDW 14.7 11.5 - 15.5 %   Platelets 314.0 150.0 - 400.0 K/uL   Neutrophils Relative % 55.5 43.0 - 77.0 %   Lymphocytes Relative 33.5 12.0 - 46.0 %   Monocytes Relative 7.0 3.0 - 12.0 %   Eosinophils Relative 3.3 0.0 - 5.0 %   Basophils Relative 0.7 0.0 - 3.0 %   Neutro Abs 4.1 1.4 - 7.7 K/uL   Lymphs Abs 2.5 0.7 - 4.0 K/uL   Monocytes Absolute 0.5 0.1 - 1.0 K/uL   Eosinophils Absolute 0.2 0.0 - 0.7 K/uL   Basophils Absolute 0.1 0.0 - 0.1 K/uL  Comprehensive metabolic panel     Status: Abnormal   Collection Time: 05/28/15  9:00 AM  Result Value Ref Range   Sodium 138 135 -  145 mEq/L   Potassium 4.1 3.5 - 5.1 mEq/L   Chloride 102 96 - 112 mEq/L   CO2 27 19 - 32 mEq/L   Glucose, Bld 251 (H) 70 - 99 mg/dL   BUN 20 6 - 23 mg/dL   Creatinine, Ser 0.86 0.40 - 1.20 mg/dL   Total Bilirubin 0.4 0.2 - 1.2 mg/dL   Alkaline Phosphatase 106 39 - 117 U/L   AST 124 (H) 0 - 37 U/L   ALT 128 (H) 0 - 35 U/L   Total Protein 7.8 6.0 - 8.3 g/dL   Albumin 4.2 3.5 - 5.2 g/dL   Calcium 9.5 8.4 - 10.5 mg/dL   GFR 72.86 >60.00 mL/min  Hepatic function panel     Status: Abnormal   Collection Time: 06/04/15  3:04 PM  Result Value Ref Range   Total Bilirubin 0.5 0.2 - 1.2 mg/dL   Bilirubin, Direct 0.2 0.0 - 0.3 mg/dL   Alkaline Phosphatase 117 39 - 117 U/L   AST 76 (H) 0 - 37 U/L   ALT 98 (H) 0 - 35 U/L   Total Protein 7.7 6.0 - 8.3 g/dL   Albumin 4.3 3.5 - 5.2 g/dL  IBC panel     Status: Abnormal   Collection Time: 06/04/15  3:04 PM  Result Value Ref Range   Iron 70 42 - 145 ug/dL   Transferrin 340.0 212.0 - 360.0 mg/dL   Saturation Ratios 14.7 (L) 20.0 - 50.0 %  Ferritin     Status: None   Collection Time: 06/04/15  3:04 PM  Result Value Ref Range   Ferritin 62.4 10.0 - 291.0 ng/mL  Protime-INR     Status: Abnormal   Collection Time: 06/04/15  3:04 PM  Result Value Ref Range   INR 1.1 (H) 0.8 - 1.0 ratio   Prothrombin Time 11.4 9.6 - 13.1 sec  Alpha-1-antitrypsin     Status: None   Collection Time: 06/04/15  3:04 PM  Result Value Ref Range   A-1 Antitrypsin, Ser 165 83 - 199 mg/dL  Mitochondrial antibodies     Status: None   Collection Time: 06/04/15  3:04 PM  Result Value Ref Range   Mitochondrial M2 Ab, IgG <=20.0 <=20.0 Units    Comment:  Reference Range:                                  Negative:  < or = 20.0                                  Equivocal: 20.1 - 24.9                                  Positive:  > or = 25.0 ** Please note change in reference range(s). **     ANA     Status: Abnormal   Collection Time:  06/04/15  3:04 PM  Result Value Ref Range   Anit Nuclear Antibody(ANA) POS (A) NEGATIVE  Anti-smooth muscle antibody, IgG     Status: None   Collection Time: 06/04/15  3:04 PM  Result Value Ref Range   Smooth Muscle Ab <20 <20 U    Comment:   Reference Range:      < 20   Negative > or = 20   Positive   Antibodies recognizing actin are the main component of smooth muscle antibodies associated with autoimmune liver disease. Actin antibodies are found in approximately 75% of patients with autoimmune hepatitis (AIH) type 1, approximately 65% of patients with autoimmune cholangitis, approximately 30% of patients with primary biliary cirrhosis, and approximately 2% of healthy people. High values are closely correlated with AIH type 1.   Ceruloplasmin     Status: None   Collection Time: 06/04/15  3:04 PM  Result Value Ref Range   Ceruloplasmin 30 18 - 53 mg/dL  Anti-nuclear ab-titer (ANA titer)     Status: Abnormal   Collection Time: 06/04/15  3:04 PM  Result Value Ref Range   ANA Pattern 1 HOMOGENEOUS (A)    ANA Titer 1 1:320 (H) titer    Comment:           Reference Range           < 1:40      Negative             1:40-1:80 Low Antibody level           > 1:80      Elevated Antibody level   TSH     Status: Abnormal   Collection Time: 06/07/15  3:59 PM  Result Value Ref Range   TSH 8.84 (H) 0.35 - 4.50 uIU/mL    Assessment/Plan: 1. Hypothyroidism, unspecified hypothyroidism type Will repeat TSH levels today. Will alter regimen based on results. - TSH  2. Uncontrolled type 2 diabetes mellitus with complication, with long-term current use of insulin ([place]) Due for repeat labs. Is taking medications as directed. Will alter regimen based on results. - Comp Met (CMET) - Hemoglobin A1c  3. Elevated liver enzymes Will repeat CMP today to trend enzyme levels. FU scheduled with GI who is managing this.  Leeanne Rio, PA-C

## 2015-07-25 NOTE — Patient Instructions (Signed)
Please go to the lab for blood work. I will call with your results.  Please continue medications for now with the following exception: - increase Toujeo to 92 units nightly.  We will alter medication further once results are in.

## 2015-07-26 LAB — COMPREHENSIVE METABOLIC PANEL
ALBUMIN: 4.5 g/dL (ref 3.5–5.2)
ALK PHOS: 115 U/L (ref 39–117)
ALT: 126 U/L — AB (ref 0–35)
AST: 97 U/L — ABNORMAL HIGH (ref 0–37)
BILIRUBIN TOTAL: 0.5 mg/dL (ref 0.2–1.2)
BUN: 24 mg/dL — ABNORMAL HIGH (ref 6–23)
CALCIUM: 10.8 mg/dL — AB (ref 8.4–10.5)
CO2: 25 meq/L (ref 19–32)
Chloride: 99 mEq/L (ref 96–112)
Creatinine, Ser: 1.03 mg/dL (ref 0.40–1.20)
GFR: 59.14 mL/min — AB (ref >60.00)
Glucose, Bld: 273 mg/dL — ABNORMAL HIGH (ref 70–99)
Potassium: 4.3 mEq/L (ref 3.5–5.1)
Sodium: 139 mEq/L (ref 135–145)
Total Protein: 8.3 g/dL (ref 6.0–8.3)

## 2015-07-26 LAB — HEMOGLOBIN A1C: HEMOGLOBIN A1C: 9.9 % — AB (ref 4.6–6.5)

## 2015-07-26 LAB — TSH: TSH: 3.44 u[IU]/mL (ref 0.35–4.50)

## 2015-08-01 ENCOUNTER — Ambulatory Visit: Payer: Self-pay | Admitting: Gastroenterology

## 2015-08-01 ENCOUNTER — Telehealth: Payer: Self-pay | Admitting: Physician Assistant

## 2015-08-01 NOTE — Telephone Encounter (Signed)
Please see result note 

## 2015-08-01 NOTE — Telephone Encounter (Signed)
Pt called in to get her lab results.    CB: [phone] --- pt says that her home number isn't working properly, please call cell

## 2015-08-02 NOTE — Telephone Encounter (Signed)
Cristina Burns called and spoke with pt on the lab results. Pt did not have any further questions.

## 2015-08-07 ENCOUNTER — Encounter: Payer: Self-pay | Admitting: Gastroenterology

## 2015-08-07 ENCOUNTER — Ambulatory Visit (INDEPENDENT_AMBULATORY_CARE_PROVIDER_SITE_OTHER): Payer: Self-pay | Admitting: Gastroenterology

## 2015-08-07 ENCOUNTER — Ambulatory Visit: Payer: Self-pay | Admitting: Gastroenterology

## 2015-08-07 VITALS — BP 134/78 | HR 72 | Ht 62.0 in | Wt 194.0 lb

## 2015-08-07 DIAGNOSIS — K760 Fatty (change of) liver, not elsewhere classified: Secondary | ICD-10-CM

## 2015-08-07 DIAGNOSIS — R945 Abnormal results of liver function studies: Principal | ICD-10-CM

## 2015-08-07 DIAGNOSIS — R1013 Epigastric pain: Secondary | ICD-10-CM

## 2015-08-07 DIAGNOSIS — R7989 Other specified abnormal findings of blood chemistry: Secondary | ICD-10-CM

## 2015-08-07 NOTE — Progress Notes (Signed)
    History of Present Illness: This is a 55 year old female returning for follow-up of elevated LFTs and mild epigastric pain. ANA positive at 1:320 and all other hepatic serology results were negative or normal. Low iron saturation noted with normal ferritin. She has hepatic steatosis by imaging. Diabetes mellitus remains poorly controlled and patient states her blood sugars at home are typically between 200 to 400 range. Diabetic regimen was recently intensified however she has not yet seen an improvement in blood sugars.  Current Medications, Allergies, Past Medical History, Past Surgical History, Family History and Social History were reviewed in [employer] record.  Physical Exam: General: Well developed, well nourished, no acute distress Head: Normocephalic and atraumatic Eyes:  sclerae anicteric, EOMI Ears: Normal auditory acuity Mouth: No deformity or lesions Lungs: Clear throughout to auscultation Heart: Regular rate and rhythm; no murmurs, rubs or bruits Abdomen: Soft, non tender and non distended. No masses, hepatosplenomegaly or hernias noted. Normal Bowel sounds Musculoskeletal: Symmetrical with no gross deformities  Pulses:  Normal pulses noted Extremities: No clubbing, cyanosis, edema or deformities noted Neurological: Alert oriented x 4, grossly nonfocal Psychological:  Alert and cooperative. Normal mood and affect  Assessment and Recommendations:  1. Elevated LFTs. Hepatic steatosis versus autoimmune hepatitis or both. We had a long discussion about liver biopsy to further evaluate. I addressed her questions to her satisfaction and she agrees to proceed. She understands that there are risks with the biopsy which will be addressed by the radiologists performing the procedure. Long-term low fat, carb modified, weight loss diet with optimal control of diabetes mellitus supervised by her PCP.  2. Epigastric pain and mild esophageal thickening on CT scan.  Suspected GERD leading to both. GI symptoms could be exacerbated by inadequate DM control. Recommended EGD. Because she does not have insurance she declines EGD at this time but is willing to reconsider at some time in the future if her epigastric pain persists. Continue pantoprazole 40 mg twice daily and standard antireflux measures.  3. Diabetes mellitus, inadequate control. Close follow-up with PCP.

## 2015-08-07 NOTE — Patient Instructions (Signed)
Interventional Radiology will contact you with an appointment date and time for your liver biopsy.   Thank you for choosing me and [facility].  Pricilla Riffle. Dagoberto Ligas., MD., [facility]

## 2015-08-08 NOTE — Addendum Note (Signed)
Addended by: Larina Bras on: 08/08/2015 08:44 AM   Modules accepted: Orders

## 2015-08-12 ENCOUNTER — Other Ambulatory Visit: Payer: Self-pay | Admitting: Physician Assistant

## 2015-08-13 ENCOUNTER — Other Ambulatory Visit: Payer: Self-pay

## 2015-08-14 ENCOUNTER — Encounter: Payer: Self-pay | Admitting: Gastroenterology

## 2015-08-15 ENCOUNTER — Other Ambulatory Visit: Payer: Self-pay | Admitting: Physician Assistant

## 2015-08-16 ENCOUNTER — Encounter (HOSPITAL_COMMUNITY): Payer: Self-pay

## 2015-08-16 ENCOUNTER — Ambulatory Visit (HOSPITAL_COMMUNITY)
Admission: RE | Admit: 2015-08-16 | Discharge: 2015-08-16 | Disposition: A | Discharge status: Home / Self Care | Payer: Self-pay | Source: Ambulatory Visit | Attending: Gastroenterology | Admitting: Gastroenterology

## 2015-08-16 DIAGNOSIS — Z87891 Personal history of nicotine dependence: Secondary | ICD-10-CM | POA: Insufficient documentation

## 2015-08-16 DIAGNOSIS — R1013 Epigastric pain: Secondary | ICD-10-CM

## 2015-08-16 DIAGNOSIS — E049 Nontoxic goiter, unspecified: Secondary | ICD-10-CM | POA: Insufficient documentation

## 2015-08-16 DIAGNOSIS — K760 Fatty (change of) liver, not elsewhere classified: Secondary | ICD-10-CM

## 2015-08-16 DIAGNOSIS — K7581 Nonalcoholic steatohepatitis (NASH): Secondary | ICD-10-CM | POA: Insufficient documentation

## 2015-08-16 DIAGNOSIS — R945 Abnormal results of liver function studies: Secondary | ICD-10-CM

## 2015-08-16 DIAGNOSIS — R1011 Right upper quadrant pain: Secondary | ICD-10-CM | POA: Insufficient documentation

## 2015-08-16 DIAGNOSIS — R7989 Other specified abnormal findings of blood chemistry: Secondary | ICD-10-CM

## 2015-08-16 DIAGNOSIS — G2581 Restless legs syndrome: Secondary | ICD-10-CM | POA: Insufficient documentation

## 2015-08-16 DIAGNOSIS — Z7984 Long term (current) use of oral hypoglycemic drugs: Secondary | ICD-10-CM | POA: Insufficient documentation

## 2015-08-16 DIAGNOSIS — Z79899 Other long term (current) drug therapy: Secondary | ICD-10-CM | POA: Insufficient documentation

## 2015-08-16 DIAGNOSIS — Z881 Allergy status to other antibiotic agents status: Secondary | ICD-10-CM | POA: Insufficient documentation

## 2015-08-16 DIAGNOSIS — K219 Gastro-esophageal reflux disease without esophagitis: Secondary | ICD-10-CM | POA: Insufficient documentation

## 2015-08-16 DIAGNOSIS — Z833 Family history of diabetes mellitus: Secondary | ICD-10-CM | POA: Insufficient documentation

## 2015-08-16 DIAGNOSIS — E1140 Type 2 diabetes mellitus with diabetic neuropathy, unspecified: Secondary | ICD-10-CM | POA: Insufficient documentation

## 2015-08-16 DIAGNOSIS — I10 Essential (primary) hypertension: Secondary | ICD-10-CM | POA: Insufficient documentation

## 2015-08-16 DIAGNOSIS — Z8249 Family history of ischemic heart disease and other diseases of the circulatory system: Secondary | ICD-10-CM | POA: Insufficient documentation

## 2015-08-16 DIAGNOSIS — Z885 Allergy status to narcotic agent status: Secondary | ICD-10-CM | POA: Insufficient documentation

## 2015-08-16 LAB — APTT: aPTT: 23 s — ABNORMAL LOW (ref 24–36)

## 2015-08-16 LAB — CBC
HCT: 39.2 % (ref 36.0–46.0)
Hemoglobin: 13.4 g/dL (ref 12.0–15.0)
MCH: 29.2 pg (ref 26.0–34.0)
MCHC: 34.2 g/dL (ref 30.0–36.0)
MCV: 85.4 fL (ref 78.0–100.0)
Platelets: 294 10*3/uL (ref 150–400)
RBC: 4.59 MIL/uL (ref 3.87–5.11)
RDW: 13.1 % (ref 11.5–15.5)
WBC: 7.7 10*3/uL (ref 4.0–10.5)

## 2015-08-16 LAB — PROTIME-INR
INR: 0.95
Prothrombin Time: 12.7 seconds (ref 11.4–15.2)

## 2015-08-16 LAB — GLUCOSE, CAPILLARY
GLUCOSE-CAPILLARY: 242 mg/dL — AB (ref 65–99)
Glucose-Capillary: 170 mg/dL — ABNORMAL HIGH (ref 65–99)

## 2015-08-16 MED ORDER — MIDAZOLAM HCL 2 MG/2ML IJ SOLN
INTRAMUSCULAR | Status: AC | PRN
  Administered 2015-08-16 (×3): 1 mg via INTRAVENOUS

## 2015-08-16 MED ORDER — FENTANYL CITRATE (PF) 100 MCG/2ML IJ SOLN
INTRAMUSCULAR | Status: AC | PRN
  Administered 2015-08-16: 50 ug via INTRAVENOUS

## 2015-08-16 MED ORDER — SODIUM CHLORIDE 0.9 % IV SOLN
INTRAVENOUS | Status: DC
  Administered 2015-08-16: 12:00:00 via INTRAVENOUS

## 2015-08-16 MED ORDER — FENTANYL CITRATE (PF) 100 MCG/2ML IJ SOLN
INTRAMUSCULAR | Status: AC
  Filled 2015-08-16: qty 4

## 2015-08-16 MED ORDER — MIDAZOLAM HCL 2 MG/2ML IJ SOLN
INTRAMUSCULAR | Status: AC
  Filled 2015-08-16: qty 6

## 2015-08-16 NOTE — Discharge Instructions (Signed)
Liver Biopsy °The liver is a large organ in the upper right-hand side of your abdomen. A liver biopsy is a procedure in which a tissue sample is taken from the liver and examined under a microscope. The procedure is done to confirm a suspected problem. °There are three types of liver biopsies: °· Percutaneous. In this type, an incision is made in your abdomen. The sample is removed through the incision with a needle. °· Laparoscopic. In this type, several incisions are made in the abdomen. A tiny camera is passed through one of the incisions to help guide the health care provider. The sample is removed through the other incision or incisions. °· Transjugular. In this type, an incision is made in the neck. A tube is passed through the incision to the liver. The sample is removed through the tube with a needle. °LET YOUR HEALTH CARE PROVIDER KNOW ABOUT: °· Any allergies you have. °· All medicines you are taking, including vitamins, herbs, eye drops, creams, and over-the-counter medicines. °· Previous problems you or members of your family have had with the use of anesthetics. °· Any blood disorders you have. °· Previous surgeries you have had. °· Medical conditions you have. °· Possibility of pregnancy, if this applies. °RISKS AND COMPLICATIONS °Generally, this is a safe procedure. However, problems can occur and include: °· Bleeding. °· Infection. °· Bruising. °· Collapsed lung. °· Leak of digestive juices (bile) from the liver or gallbladder. °· Problems with heart rhythm. °· Pain at the biopsy site or in the right shoulder. °· Low blood pressure (hypotension). °· Injury to nearby organs or tissues. °BEFORE THE PROCEDURE °· Your health care provider may do some blood or urine tests. These will help your health care provider learn how well your kidneys and liver are working and how well your blood clots. °· Ask your health care provider if you will be able to go home the day of the procedure. Arrange for someone to  take you home and stay with you for at least 24 hours. °· Do not eat or drink anything after midnight on the night before the procedure or as directed by your health care provider. °· Ask your health care provider about: °· Changing or stopping your regular medicines. This is especially important if you are taking diabetes medicines or blood thinners. °· Taking medicines such as aspirin and ibuprofen. These medicines can thin your blood. Do not take these medicines before your procedure if your health care provider asks you not to. °PROCEDURE °Regardless of the type of biopsy that will be done, you will have an IV line placed. Through this line, you will receive fluids and medicine to relax you. If you will be having a laparoscopic biopsy, you may also receive medicine through this line to make you sleep during the procedure (general anesthetic). °Percutaneous Liver Biopsy °· You will positioned on your back, with your right hand over your head. °· A health care provider will locate your liver by tapping and pressing on the right side of your abdomen or with the help of an ultrasound machine or CT scan. °· An area at the bottom of your last right rib will be numbed. °· An incision will be made in the numbed area. °· The biopsy needle will be inserted into the incision. °· Several samples of liver tissue will be taken with the biopsy needle. You will be asked to hold your breath as each sample is taken. °Laparoscopic Liver Biopsy °· You will be   positioned on your back. °· Several small incisions will be made in your abdomen. °· Your doctor will pass a tiny camera through one incision. The camera will allow the liver to be viewed on a TV monitor in the operating room. °· Tools will be passed through the other incision or incisions. These tools will be used to remove samples of liver tissue. °Transjugular Liver Biopsy °· You will be positioned on your back on an X-ray table, with your head turned to your left. °· An  area on your neck just over your jugular vein will be numbed. °· An incision will be made in the numbed area. °· A tiny tube will be inserted through the incision. It will be pushed through the jugular vein to a blood vessel in the liver called the hepatic vein. °· Dye will be inserted through the tube, and X-rays will be taken. The dye will make the blood vessels in the liver light up on the X-rays. °· The biopsy needle will be pushed through the tube until it reaches the liver. °· Samples of liver tissue will be taken with the biopsy needle. °· The needle and the tube will be removed. °After the samples are obtained, the incision or incisions will be closed. °AFTER THE PROCEDURE °· You will be taken to a recovery area. °· You may have to lie on your right side for 1-2 hours. This will prevent bleeding from the biopsy site. °· Your progress will be watched. Your blood pressure, pulse, and the biopsy site will be checked often. °· You may have some pain or feel sick. If this happens, tell your health care provider. °· As you begin to feel better, you will be offered ice and beverages. °· You may be allowed to go home when the medicines have worn off and you can walk, drink, eat, and use the bathroom. °  °This information is not intended to replace advice given to you by your health care provider. Make sure you discuss any questions you have with your health care provider. °  °Document Released: 03/29/2003 Document Revised: 01/27/2014 Document Reviewed: 03/04/2013 °Elsevier Interactive Patient Education ©2016 [employer] °Liver Biopsy, Care After °Refer to this sheet in the next few weeks. These instructions provide you with information on caring for yourself after your procedure. Your health care provider may also give you more specific instructions. Your treatment has been planned according to current medical practices, but problems sometimes occur. Call your health care provider if you have any problems or  questions after your procedure. °WHAT TO EXPECT AFTER THE PROCEDURE °After your procedure, it is typical to have the following: °· A small amount of discomfort in the area where the biopsy was done and in the right shoulder or shoulder blade. °· A small amount of bruising around the area where the biopsy was done and on the skin over the liver. °· Sleepiness and fatigue for the rest of the day. °HOME CARE INSTRUCTIONS  °· Rest at home for 1-2 days or as directed by your health care provider. °· Have a friend or family member stay with you for at least 24 hours. °· Because of the medicines used during the procedure, you should not do the following things in the first 24 hours: °¨ Drive. °¨ Use machinery. °¨ Be responsible for the care of other people. °¨ Sign legal documents. °¨ Take a bath or shower. °· There are many different ways to close and cover an incision, including stitches,   skin glue, and adhesive strips. Follow your health care provider's instructions on: °¨ Incision care. °¨ Bandage (dressing) changes and removal. °¨ Incision closure removal. °· Do not drink alcohol in the first week. °· Do not lift more than 5 pounds or play contact sports for 2 weeks after this test. °· Take medicines only as directed by your health care provider. Do not take medicine containing aspirin or non-steroidal anti-inflammatory medicines such as ibuprofen for 1 week after this test. °· It is your responsibility to get your test results. °SEEK MEDICAL CARE IF:  °· You have increased bleeding from an incision that results in more than a small spot of blood. °· You have redness, swelling, or increasing pain in any incisions. °· You notice a discharge or a bad smell coming from any of your incisions. °· You have a fever or chills. °SEEK IMMEDIATE MEDICAL CARE IF:  °· You develop swelling, bloating, or pain in your abdomen. °· You become dizzy or faint. °· You develop a rash. °· You are nauseous or vomit. °· You have difficulty  breathing, feel short of breath, or feel faint. °· You develop chest pain. °· You have problems with your speech or vision. °· You have trouble balancing or moving your arms or legs. °  °This information is not intended to replace advice given to you by your health care provider. Make sure you discuss any questions you have with your health care provider. °  °Document Released: 07/26/2004 Document Revised: 01/27/2014 Document Reviewed: 03/04/2013 °Elsevier Interactive Patient Education ©2016 [employer] °Moderate Conscious Sedation, Adult, Care After °Refer to this sheet in the next few weeks. These instructions provide you with information on caring for yourself after your procedure. Your health care provider may also give you more specific instructions. Your treatment has been planned according to current medical practices, but problems sometimes occur. Call your health care provider if you have any problems or questions after your procedure. °WHAT TO EXPECT AFTER THE PROCEDURE  °After your procedure: °· You may feel sleepy, clumsy, and have poor balance for several hours. °· Vomiting may occur if you eat too soon after the procedure. °HOME CARE INSTRUCTIONS °· Do not participate in any activities where you could become injured for at least 24 hours. Do not: °¨ Drive. °¨ Swim. °¨ Ride a bicycle. °¨ Operate heavy machinery. °¨ Cook. °¨ Use power tools. °¨ Climb ladders. °¨ Work from a high place. °· Do not make important decisions or sign legal documents until you are improved. °· If you vomit, drink water, juice, or soup when you can drink without vomiting. Make sure you have little or no nausea before eating solid foods. °· Only take over-the-counter or prescription medicines for pain, discomfort, or fever as directed by your health care provider. °· Make sure you and your family fully understand everything about the medicines given to you, including what side effects may occur. °· You should not drink alcohol,  take sleeping pills, or take medicines that cause drowsiness for at least 24 hours. °· If you smoke, do not smoke without supervision. °· If you are feeling better, you may resume normal activities 24 hours after you were sedated. °· Keep all appointments with your health care provider. °SEEK MEDICAL CARE IF: °· Your skin is pale or bluish in color. °· You continue to feel nauseous or vomit. °· Your pain is getting worse and is not helped by medicine. °· You have bleeding or swelling. °· You are   still sleepy or feeling clumsy after 24 hours. °SEEK IMMEDIATE MEDICAL CARE IF: °· You develop a rash. °· You have difficulty breathing. °· You develop any type of allergic problem. °· You have a fever. °MAKE SURE YOU: °· Understand these instructions. °· Will watch your condition. °· Will get help right away if you are not doing well or get worse. °  °This information is not intended to replace advice given to you by your health care provider. Make sure you discuss any questions you have with your health care provider. °  °Document Released: 10/27/2012 Document Revised: 01/27/2014 Document Reviewed: 10/27/2012 °Elsevier Interactive Patient Education ©2016 [employer]. ° °

## 2015-08-16 NOTE — Procedures (Signed)
Interventional Radiology Procedure Note  Procedure: US guided liver biopsy, medical liver Complications: None Recommendations:  - Ok to shower tomorrow - Do not submerge for 7 days - Routine wound care - follow up pathology   Signed,  Dulcy Fanny. Earleen Newport, DO

## 2015-08-16 NOTE — H&P (Signed)
Chief Complaint: elevated LFTs  Referring Physician:Dr. Lucio Edward   Supervising Physician: Corrie Mckusick  Patient Status:  Out-pt  HPI: Cristina Burns is an 55 y.o. female with a recent finding earlier this year of elevated LFTs when she switched to a new physician.  He continued to follow these and they remained elevated.  He sent her to see Dr. Fuller Plan with GI.  He has requested a liver biopsy to rule out autoimmune hepatitis or any other etiology for her elevated LFTs.  The patient does complains of RUQ abdominal pain and intermittent nausea.  Past Medical History:  Past Medical History:  Diagnosis Date  . ADHD (attention deficit hyperactivity disorder)   . Allergic rhinitis   . Allergy   . Diabetes mellitus without complication ([place])   . Diabetic neuropathy ([place])   . GERD (gastroesophageal reflux disease)   . Hypertension   . Hypothyroidism   . RLS (restless legs syndrome)   . Stomach ulcer "it's been a long time"    Past Surgical History:  Past Surgical History:  Procedure Laterality Date  . ABDOMINAL HYSTERECTOMY  2002  . ACHILLES TENDON REPAIR  2009  . CHOLECYSTECTOMY  1982  . ROTATOR CUFF REPAIR Right 2011    Family History:  Family History  Problem Relation Age of Onset  . Heart disease Father   . Heart attack Father   . Heart disease Brother     CABG  . Diabetes Brother   . Kidney disease Brother   . Cirrhosis Brother   . Hypertension Brother   . Heart attack Brother   . Heart disease Brother   . Diabetes Brother   . Heart attack Brother   . Diabetes Mother   . Breast cancer Mother   . Stroke Mother     Social History:  reports that she quit smoking about 33 years ago. Her smoking use included Cigarettes. She started smoking about 42 years ago. She smoked 2.00 packs per day. She has never used smokeless tobacco. She reports that she does not drink alcohol or use drugs.  Allergies:  Allergies  Allergen Reactions  . Codeine Hives  . Darvon  [Propoxyphene] Hives    "Darvocet"  . Erythromycin     Stomach pain  . Keflex [Cephalexin] Hives    Medications:   Medication List    ASK your doctor about these medications   amLODipine 5 MG tablet Commonly known as:  NORVASC Take 1 tablet (5 mg total) by mouth daily.   cetirizine 10 MG tablet Commonly known as:  ZYRTEC Take 10 mg by mouth daily.   FREESTYLE LITE test strip Generic drug:  glucose blood 2 (two) times daily. use for testing   gabapentin 600 MG tablet Commonly known as:  NEURONTIN Take 300-600 mg by mouth 3 (three) times daily as needed.   glimepiride 4 MG tablet Commonly known as:  AMARYL Take 1 tablet (4 mg total) by mouth daily with breakfast.   levothyroxine 150 MCG tablet Commonly known as:  SYNTHROID, LEVOTHROID TAKE ONE TABLET BY MOUTH ONCE DAILY BEFORE BREAKFAST   losartan 25 MG tablet Commonly known as:  COZAAR Take 0.5 tablets (12.5 mg total) by mouth daily.   metFORMIN 500 MG 24 hr tablet Commonly known as:  GLUCOPHAGE-XR Take 4 tablets (2,000 mg total) by mouth daily with supper.   methylphenidate 20 MG tablet Commonly known as:  RITALIN Take 1 tablet (20 mg total) by mouth 2 (two) times daily with breakfast  and lunch.   multivitamin-iron-minerals-folic acid chewable tablet Chew 1 tablet by mouth daily.   NASACORT ALLERGY 24HR 55 MCG/ACT Aero nasal inhaler Generic drug:  triamcinolone Place 1 spray into the nose daily.   pantoprazole 40 MG tablet Commonly known as:  PROTONIX Take 1 tablet (40 mg total) by mouth 2 (two) times daily.   rOPINIRole 0.5 MG tablet Commonly known as:  REQUIP Take 1 tab in the morning and afternoon. Take 2 tabs at bedtime.   TOUJEO SOLOSTAR [place] Inject 100 Units into the skin at bedtime.   triamterene-hydrochlorothiazide 37.5-25 MG capsule Commonly known as:  DYAZIDE Take 1 each (1 capsule total) by mouth daily.   TRULICITY [place] Inject 75 mg into the skin once a week.       Please HPI for  pertinent positives, otherwise complete 10 system ROS negative.  Mallampati Score: MD Evaluation Airway: WNL Heart: WNL Abdomen: WNL Chest/ Lungs: WNL ASA  Classification: 2 Mallampati/Airway Score: Two  Physical Exam: BP (!) 150/68 (BP Location: Left Arm)   Pulse (!) 107   Resp 13   SpO2 100%  There is no height or weight on file to calculate BMI. General: pleasant, obese white female who is laying in bed in NAD HEENT: head is normocephalic, atraumatic.  Sclera are noninjected.  PERRL.  Ears and nose without any masses or lesions.  Mouth is pink and moist Heart: regular, rate, and rhythm.  Normal s1,s2. No obvious murmurs, gallops, or rubs noted.  Palpable radial and pedal pulses bilaterally Lungs: CTAB, no wheezes, rhonchi, or rales noted.  Respiratory effort nonlabored Abd: soft, tender in RUQ, ND, +BS, no masses, hernias, or organomegaly MS: all 4 extremities are symmetrical with no cyanosis, clubbing.  Minimal LE edema Psych: A&Ox3 with an appropriate affect.   Labs: Results for orders placed or performed during the hospital encounter of 08/16/15 (from the past 48 hour(s))  APTT upon arrival     Status: Abnormal   Collection Time: 08/16/15 11:25 AM  Result Value Ref Range   aPTT 23 (L) 24 - 36 seconds  CBC upon arrival     Status: None   Collection Time: 08/16/15 11:25 AM  Result Value Ref Range   WBC 7.7 4.0 - 10.5 K/uL   RBC 4.59 3.87 - 5.11 MIL/uL   Hemoglobin 13.4 12.0 - 15.0 g/dL   HCT 39.2 36.0 - 46.0 %   MCV 85.4 78.0 - 100.0 fL   MCH 29.2 26.0 - 34.0 pg   MCHC 34.2 30.0 - 36.0 g/dL   RDW 13.1 11.5 - 15.5 %   Platelets 294 150 - 400 K/uL  Protime-INR upon arrival     Status: None   Collection Time: 08/16/15 11:25 AM  Result Value Ref Range   Prothrombin Time 12.7 11.4 - 15.2 seconds   INR 0.95     Imaging: No results found.  Assessment/Plan 1. Elevated LFTs -we will proceed with random liver biopsy -labs and vitals reviewed. -Risks and Benefits  discussed with the patient including, but not limited to bleeding, infection, damage to adjacent structures or low yield requiring additional tests. All of the patient's questions were answered, patient is agreeable to proceed. Consent signed and in chart.  Thank you for this interesting consult.  I greatly enjoyed meeting Cristina Burns and look forward to participating in their care.  A copy of this report was sent to the requesting provider on this date.  Electronically Signed: Henreitta Cea 08/16/2015, 1:24 PM  I spent a total of  30 Minutes   in face to face in clinical consultation, greater than 50% of which was counseling/coordinating care for elevated LFTs

## 2015-08-22 ENCOUNTER — Encounter: Payer: Self-pay | Admitting: Gastroenterology

## 2015-08-24 ENCOUNTER — Telehealth: Payer: Self-pay | Admitting: Gastroenterology

## 2015-08-24 NOTE — Telephone Encounter (Signed)
Questions answered about letter that was mailed.  I was not able to answer all of her questions to her satisfaction.  She does not want to schedule an office visit to come in and discuss liver biopsy and get her questions answered.  She want to talk more with her primary care Elyn Aquas.  She will call back if she changes her mind and wants to set up an appt.

## 2015-08-28 ENCOUNTER — Encounter: Payer: Self-pay | Admitting: Physician Assistant

## 2015-08-29 ENCOUNTER — Other Ambulatory Visit: Payer: Self-pay | Admitting: Internal Medicine

## 2015-08-29 ENCOUNTER — Encounter: Payer: Self-pay | Admitting: Physician Assistant

## 2015-08-29 MED ORDER — METHYLPHENIDATE HCL 20 MG PO TABS: 20.0000 mg | ORAL_TABLET | Freq: Two times a day (BID) | ORAL | 0 refills | Status: DC

## 2015-08-31 ENCOUNTER — Ambulatory Visit (INDEPENDENT_AMBULATORY_CARE_PROVIDER_SITE_OTHER): Payer: Self-pay | Admitting: Physician Assistant

## 2015-08-31 ENCOUNTER — Encounter: Payer: Self-pay | Admitting: Physician Assistant

## 2015-08-31 VITALS — BP 132/78 | HR 103 | Temp 98.2°F | Resp 16 | Ht 62.0 in | Wt 197.5 lb

## 2015-08-31 DIAGNOSIS — R399 Unspecified symptoms and signs involving the genitourinary system: Secondary | ICD-10-CM

## 2015-08-31 DIAGNOSIS — R81 Glycosuria: Secondary | ICD-10-CM

## 2015-08-31 LAB — POCT URINALYSIS DIPSTICK
BILIRUBIN UA: NEGATIVE
Blood, UA: NEGATIVE
Glucose, UA: POSITIVE
KETONES UA: POSITIVE
LEUKOCYTES UA: NEGATIVE
Nitrite, UA: NEGATIVE
PH UA: 6
Protein, UA: NEGATIVE
Urobilinogen, UA: 0.2

## 2015-08-31 LAB — POCT CBG (FASTING - GLUCOSE)-MANUAL ENTRY: Glucose Fasting, POC: 291 mg/dL — AB (ref 70–99)

## 2015-08-31 MED ORDER — NITROFURANTOIN MONOHYD MACRO 100 MG PO CAPS: 100.0000 mg | ORAL_CAPSULE | Freq: Two times a day (BID) | ORAL | 0 refills | Status: DC

## 2015-08-31 NOTE — Progress Notes (Signed)
[id number], Cristina Manly, PA-C Chief Complaint  Patient presents with  . Urinary Tract Infection    Pt c/o urinary frequency, burning & discomfort, with urination x1 day    Current Issues:  Presents with 2 days of dysuria, urinary urgency and urinary frequency Associated symptoms include:  cloudy urine and nausea  There is a previous history of of similar symptoms. Sexually active:  No   No concern for STI.  Prior to Admission medications   Medication Sig Start Date End Date Taking? Authorizing Provider  amLODipine (NORVASC) 5 MG tablet Take 1 tablet (5 mg total) by mouth daily. 03/12/15  Yes Brunetta Jeans, PA-C  cetirizine (ZYRTEC) 10 MG tablet Take 10 mg by mouth daily.   Yes Historical Provider, MD  FREESTYLE LITE test strip 2 (two) times daily. use for testing 12/21/14  Yes Historical Provider, MD  gabapentin (NEURONTIN) 600 MG tablet Take 300-600 mg by mouth 3 (three) times daily as needed.   Yes Historical Provider, MD  glimepiride (AMARYL) 4 MG tablet Take 1 tablet (4 mg total) by mouth daily with breakfast. Patient taking differently: Take 4 mg by mouth 2 (two) times daily before a meal.  06/12/15  Yes Brunetta Jeans, PA-C  Insulin Glargine (TOUJEO SOLOSTAR [place]) Inject 100 Units into the skin at bedtime.    Yes Historical Provider, MD  levothyroxine (SYNTHROID, LEVOTHROID) 150 MCG tablet TAKE ONE TABLET BY MOUTH ONCE DAILY BEFORE BREAKFAST 08/12/15  Yes Brunetta Jeans, PA-C  losartan (COZAAR) 25 MG tablet Take 0.5 tablets (12.5 mg total) by mouth daily. 04/18/15  Yes Brunetta Jeans, PA-C  metFORMIN (GLUCOPHAGE-XR) 500 MG 24 hr tablet Take 4 tablets (2,000 mg total) by mouth daily with supper. 05/25/15  Yes Brunetta Jeans, PA-C  methylphenidate (RITALIN) 20 MG tablet Take 1 tablet (20 mg total) by mouth 2 (two) times daily with breakfast and lunch. 08/29/15  Yes Brunetta Jeans, PA-C  multivitamin-iron-minerals-folic acid (CENTRUM) chewable tablet Chew 1 tablet by mouth daily.    Yes Historical Provider, MD  pantoprazole (PROTONIX) 40 MG tablet Take 1 tablet (40 mg total) by mouth 2 (two) times daily. 03/12/15  Yes Brunetta Jeans, PA-C  rOPINIRole (REQUIP) 0.5 MG tablet Take 1 tab in the morning and afternoon. Take 2 tabs at bedtime. 03/12/15  Yes Brunetta Jeans, PA-C  triamcinolone (NASACORT ALLERGY 24HR) 55 MCG/ACT AERO nasal inhaler Place 1 spray into the nose daily.   Yes Historical Provider, MD  triamterene-hydrochlorothiazide (DYAZIDE) 37.5-25 MG capsule Take 1 each (1 capsule total) by mouth daily. 03/12/15  Yes Brunetta Jeans, PA-C    Review of Systems:See HPI. Take as directed.  PE:  BP 132/78 (BP Location: Right Arm, Patient Position: Sitting, Cuff Size: Large)   Pulse (!) 103   Temp 98.2 F (36.8 C) (Oral)   Resp 16   Ht 5\' 2"  (1.575 m)   Wt 197 lb 8 oz (89.6 kg)   SpO2 98%   BMI 36.12 kg/m   General appearance: alert, cooperative, appears stated age and no distress Lungs: clear to auscultation bilaterally Heart: regular rate and rhythm, S1, S2 normal, no murmur, click, rub or gallop Abdomen: soft, non-tender; bowel sounds normal; no masses,  no organomegaly and negative CVA tenderness   Results for orders placed or performed during the hospital encounter of 08/16/15  Glucose, capillary  Result Value Ref Range   Glucose-Capillary 242 (H) 65 - 99 mg/dL  Glucose, capillary  Result Value Ref Range  Glucose-Capillary 170 (H) 65 - 99 mg/dL    Assessment and Plan:   1. UTI symptoms Classic symptoms. Rx Macrobid. Culture sent. Will alter regimen based on results. Supportive measures reviewed. - nitrofurantoin, macrocrystal-monohydrate, (MACROBID) 100 MG capsule; Take 1 capsule (100 mg total) by mouth 2 (two) times daily.  Dispense: 10 capsule; Refill: 0 - CULTURE, URINE COMPREHENSIVE - POCT urinalysis dipstick  2. 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 Regimen reviewed. Will work on increasing [place] to obtain tighter control. FU for  diabetes has been scheduled. - POCT CBG (Fasting - Glucose)

## 2015-08-31 NOTE — Patient Instructions (Signed)
Your symptoms are consistent with a bladder infection, also called acute cystitis. Please take your antibiotic (Macrobid) as directed until all pills are gone.  Stay very well hydrated.  Consider a daily probiotic (Align, Culturelle, or Activia) to help prevent stomach upset caused by the antibiotic.  Taking a probiotic daily may also help prevent recurrent UTIs.  Also consider taking AZO (Phenazopyridine) tablets to help decrease pain with urination.  I will call you with your urine testing results.  We will change antibiotics if indicated.  Call or return to clinic if symptoms are not resolved by completion of antibiotic.   Please continue medications as directed with the following changes: - Increase the Toujeo by 2 units every 3-4 days over the next 2 weeks.  - If fasting sugar < 90, decrease by 2 units and call me.  Follow-up in 2 weeks.  Urinary Tract Infection A urinary tract infection (UTI) can occur any place along the urinary tract. The tract includes the kidneys, ureters, bladder, and urethra. A type of germ called bacteria often causes a UTI. UTIs are often helped with antibiotic medicine.  HOME CARE   If given, take antibiotics as told by your doctor. Finish them even if you start to feel better.  Drink enough fluids to keep your pee (urine) clear or pale yellow.  Avoid tea, drinks with caffeine, and bubbly (carbonated) drinks.  Pee often. Avoid holding your pee in for a long time.  Pee before and after having sex (intercourse).  Wipe from front to back after you poop (bowel movement) if you are a woman. Use each tissue only once. GET HELP RIGHT AWAY IF:   You have back pain.  You have lower belly (abdominal) pain.  You have chills.  You feel sick to your stomach (nauseous).  You throw up (vomit).  Your burning or discomfort with peeing does not go away.  You have a fever.  Your symptoms are not better in 3 days. MAKE SURE YOU:   Understand these  instructions.  Will watch your condition.  Will get help right away if you are not doing well or get worse. Document Released: 06/25/2007 Document Revised: 10/01/2011 Document Reviewed: 08/07/2011 [facility] Patient Information 2015 [place], [place]. This information is not intended to replace advice given to you by your health care provider. Make sure you discuss any questions you have with your health care provider.

## 2015-09-02 LAB — CULTURE, URINE COMPREHENSIVE: ORGANISM ID, BACTERIA: NO GROWTH

## 2015-09-03 ENCOUNTER — Ambulatory Visit: Payer: Self-pay | Admitting: Physician Assistant

## 2015-09-05 ENCOUNTER — Encounter: Payer: Self-pay | Admitting: Physician Assistant

## 2015-09-05 MED ORDER — ROPINIROLE HCL 0.5 MG PO TABS: ORAL_TABLET | ORAL | 5 refills | Status: DC

## 2015-09-06 ENCOUNTER — Telehealth: Payer: Self-pay

## 2015-09-06 NOTE — Telephone Encounter (Signed)
Toujeo has arrived from the patient assistance program, a total of six boxes placed in the fridge by Dr.Wendling. The patient is aware and said she would come by to pick it up.      KP

## 2015-09-17 ENCOUNTER — Encounter: Payer: Self-pay | Admitting: Physician Assistant

## 2015-09-17 ENCOUNTER — Ambulatory Visit (INDEPENDENT_AMBULATORY_CARE_PROVIDER_SITE_OTHER): Payer: Self-pay | Admitting: Physician Assistant

## 2015-09-17 DIAGNOSIS — E1142 Type 2 diabetes mellitus with diabetic polyneuropathy: Secondary | ICD-10-CM

## 2015-09-17 DIAGNOSIS — Z794 Long term (current) use of insulin: Secondary | ICD-10-CM

## 2015-09-17 NOTE — Progress Notes (Signed)
Patient presents to clinic today for follow-up of DM II with neuropathy. Patient is currently on regimen of Amaryl 4 mg BID, Metformin 2000 mg daily and Toujeo currently at 100 units QD.. Is taking as directed. Endorses fasting sugars are still averaging in the low 200s. Endorses her diet is poor, eating mostly fried foods and carb-heavy meals. Is due for foot examination. Is due to schedule eye examination.   Past Medical History:  Diagnosis Date  . ADHD (attention deficit hyperactivity disorder)   . Allergic rhinitis   . Allergy   . Diabetes mellitus without complication ([place])   . Diabetic neuropathy ([place])   . GERD (gastroesophageal reflux disease)   . Hypertension   . Hypothyroidism   . RLS (restless legs syndrome)   . Stomach ulcer "it's been a long time"    Current Outpatient Prescriptions on File Prior to Visit  Medication Sig Dispense Refill  . amLODipine (NORVASC) 5 MG tablet Take 1 tablet (5 mg total) by mouth daily. 30 tablet 5  . cetirizine (ZYRTEC) 10 MG tablet Take 10 mg by mouth daily.    Marland Kitchen FREESTYLE LITE test strip 2 (two) times daily. use for testing  2  . gabapentin (NEURONTIN) 600 MG tablet Take 300-600 mg by mouth 3 (three) times daily as needed.    . Insulin Glargine (TOUJEO SOLOSTAR [place]) Inject 100 Units into the skin at bedtime.     Marland Kitchen levothyroxine (SYNTHROID, LEVOTHROID) 150 MCG tablet TAKE ONE TABLET BY MOUTH ONCE DAILY BEFORE BREAKFAST 30 tablet 5  . losartan (COZAAR) 25 MG tablet Take 0.5 tablets (12.5 mg total) by mouth daily. 45 tablet 1  . metFORMIN (GLUCOPHAGE-XR) 500 MG 24 hr tablet Take 4 tablets (2,000 mg total) by mouth daily with supper.    . methylphenidate (RITALIN) 20 MG tablet Take 1 tablet (20 mg total) by mouth 2 (two) times daily with breakfast and lunch. 60 tablet 0  . multivitamin-iron-minerals-folic acid (CENTRUM) chewable tablet Chew 1 tablet by mouth daily.    . pantoprazole (PROTONIX) 40 MG tablet Take 1 tablet (40 mg total) by mouth 2  (two) times daily. 60 tablet 5  . rOPINIRole (REQUIP) 0.5 MG tablet Take 1 tab in the morning and afternoon. Take 2 tabs at bedtime. 120 tablet 5  . triamcinolone (NASACORT ALLERGY 24HR) 55 MCG/ACT AERO nasal inhaler Place 1 spray into the nose daily.    Marland Kitchen triamterene-hydrochlorothiazide (DYAZIDE) 37.5-25 MG capsule Take 1 each (1 capsule total) by mouth daily. 30 capsule 5   No current facility-administered medications on file prior to visit.     Allergies  Allergen Reactions  . Codeine Hives  . Darvon [Propoxyphene] Hives    "Darvocet"  . Erythromycin     Stomach pain  . Keflex [Cephalexin] Hives    Family History  Problem Relation Age of Onset  . Heart disease Father   . Heart attack Father   . Heart disease Brother     CABG  . Diabetes Brother   . Kidney disease Brother   . Cirrhosis Brother   . Hypertension Brother   . Heart attack Brother   . Heart disease Brother   . Diabetes Brother   . Heart attack Brother   . Diabetes Mother   . Breast cancer Mother   . Stroke Mother     Social History   Social History  . Marital status: Single    Spouse name: N/A  . Number of children: 2  . Years  of education: N/A   Social History Main Topics  . Smoking status: Former Smoker    Packs/day: 2.00    Types: Cigarettes    Start date: 06/20/1973    Quit date: 06/21/1982  . Smokeless tobacco: Never Used  . Alcohol use No  . Drug use: No  . Sexual activity: Not Asked   Other Topics Concern  . None   Social History Narrative  . None    Review of Systems - See HPI.  All other ROS are negative.  BP (!) 148/78 (BP Location: Right Arm, Patient Position: Sitting, Cuff Size: Large)   Pulse (!) 106   Temp 98.2 F (36.8 C) (Oral)   Resp 16   Ht '5\' 2"'$  (1.575 m)   Wt 193 lb 2 oz (87.6 kg)   SpO2 98%   BMI 35.32 kg/m   Physical Exam  Constitutional: She is oriented to person, place, and time and well-developed, well-nourished, and in no distress.  HENT:  Head:  Normocephalic and atraumatic.  Eyes: Conjunctivae are normal.  Cardiovascular: Normal rate, regular rhythm, normal heart sounds and intact distal pulses.   Pulmonary/Chest: Effort normal and breath sounds normal. No respiratory distress. She has no wheezes. She has no rales. She exhibits no tenderness.  Neurological: She is alert and oriented to person, place, and time.  Skin: Skin is warm and dry. No rash noted.  Psychiatric: Affect normal.  Vitals reviewed.   Recent Results (from the past 2160 hour(s))  TSH     Status: None   Collection Time: 07/25/15  5:02 PM  Result Value Ref Range   TSH 3.44 0.35 - 4.50 uIU/mL  Comp Met (CMET)     Status: Abnormal   Collection Time: 07/25/15  5:02 PM  Result Value Ref Range   Sodium 139 135 - 145 mEq/L   Potassium 4.3 3.5 - 5.1 mEq/L   Chloride 99 96 - 112 mEq/L   CO2 25 19 - 32 mEq/L   Glucose, Bld 273 (H) 70 - 99 mg/dL   BUN 24 (H) 6 - 23 mg/dL   Creatinine, Ser 1.03 0.40 - 1.20 mg/dL   Total Bilirubin 0.5 0.2 - 1.2 mg/dL   Alkaline Phosphatase 115 39 - 117 U/L   AST 97 (H) 0 - 37 U/L   ALT 126 (H) 0 - 35 U/L   Total Protein 8.3 6.0 - 8.3 g/dL   Albumin 4.5 3.5 - 5.2 g/dL   Calcium 10.8 (H) 8.4 - 10.5 mg/dL   GFR 59.14 (L) >60.00 mL/min  Hemoglobin A1c     Status: Abnormal   Collection Time: 07/25/15  5:02 PM  Result Value Ref Range   Hgb A1c MFr Bld 9.9 (H) 4.6 - 6.5 %    Comment: Glycemic Control Guidelines for People with Diabetes:Non Diabetic:  <6%Goal of Therapy: <7%Additional Action Suggested:  >8%   Glucose, capillary     Status: Abnormal   Collection Time: 08/16/15 11:07 AM  Result Value Ref Range   Glucose-Capillary 242 (H) 65 - 99 mg/dL  APTT upon arrival     Status: Abnormal   Collection Time: 08/16/15 11:25 AM  Result Value Ref Range   aPTT 23 (L) 24 - 36 seconds  CBC upon arrival     Status: None   Collection Time: 08/16/15 11:25 AM  Result Value Ref Range   WBC 7.7 4.0 - 10.5 K/uL   RBC 4.59 3.87 - 5.11 MIL/uL    Hemoglobin 13.4 12.0 - 15.0 g/dL  HCT 39.2 36.0 - 46.0 %   MCV 85.4 78.0 - 100.0 fL   MCH 29.2 26.0 - 34.0 pg   MCHC 34.2 30.0 - 36.0 g/dL   RDW 13.1 11.5 - 15.5 %   Platelets 294 150 - 400 K/uL  Protime-INR upon arrival     Status: None   Collection Time: 08/16/15 11:25 AM  Result Value Ref Range   Prothrombin Time 12.7 11.4 - 15.2 seconds   INR 0.95   Glucose, capillary     Status: Abnormal   Collection Time: 08/16/15  2:03 PM  Result Value Ref Range   Glucose-Capillary 170 (H) 65 - 99 mg/dL  CULTURE, URINE COMPREHENSIVE     Status: None   Collection Time: 08/31/15  4:37 PM  Result Value Ref Range   Organism ID, Bacteria NO GROWTH   POCT urinalysis dipstick     Status: Abnormal   Collection Time: 08/31/15  4:41 PM  Result Value Ref Range   Color, UA yellow    Clarity, UA cloudy    Glucose, UA positive 3+    Bilirubin, UA neg    Ketones, UA positive 2+    Spec Grav, UA >=1.030    Blood, UA neg    pH, UA 6.0    Protein, UA neg    Urobilinogen, UA 0.2    Nitrite, UA neg    Leukocytes, UA Negative Negative  POCT CBG (Fasting - Glucose)     Status: Abnormal   Collection Time: 08/31/15  4:45 PM  Result Value Ref Range   Glucose Fasting, POC 291 (A) 70 - 99 mg/dL    Assessment/Plan: Diabetes mellitus type II, controlled ([place]) Will continue titration of Toujeo. Novolog sample given. Sliding scale given to follow. Continue Metformin. Diet is a major contributor. Patient is still eating poorly despite medical advice. Discussed this is something she needs to change quickly to help get sugar under control.    Leeanne Rio, PA-C

## 2015-09-17 NOTE — Patient Instructions (Signed)
Please continue the Amaryl. Continue the Metformin as directed for now. Continue Increasing the Toujeo every 3 days as directed. Continue checking fasting sugars. Goal is 100-130 consistently.  Start the Novolog at lunch and dinner following the following sliding scale, based on your pre-meal sugars.  Sliding scale: No units til 200 then 2 units for 201 to 250 4 units for 251 to 300  6 units for 301 to 350  8 units for 351 to 400 10 units for 401 to 450 Call if higher then 450  I want you to keep a log of your food along with your sugar log.  You need to be more consistent with a healthy diet. You need to limit carbohydrates. Water is best. No sugary drinks. Limit fried foods. Increase vegetables and protein.  Follow-up with me in 2 weeks.

## 2015-09-19 ENCOUNTER — Other Ambulatory Visit: Payer: Self-pay | Admitting: Physician Assistant

## 2015-09-19 MED ORDER — GLIMEPIRIDE 4 MG PO TABS: 4.0000 mg | ORAL_TABLET | Freq: Two times a day (BID) | ORAL | 5 refills | Status: DC

## 2015-09-21 NOTE — Assessment & Plan Note (Signed)
Will continue titration of Toujeo. Novolog sample given. Sliding scale given to follow. Continue Metformin. Diet is a major contributor. Patient is still eating poorly despite medical advice. Discussed this is something she needs to change quickly to help get sugar under control.

## 2015-10-01 ENCOUNTER — Encounter: Payer: Self-pay | Admitting: Physician Assistant

## 2015-10-01 ENCOUNTER — Ambulatory Visit (INDEPENDENT_AMBULATORY_CARE_PROVIDER_SITE_OTHER): Payer: Self-pay | Admitting: Physician Assistant

## 2015-10-01 VITALS — BP 138/82 | HR 100 | Temp 98.2°F | Resp 16 | Ht 62.0 in | Wt 199.5 lb

## 2015-10-01 DIAGNOSIS — E1142 Type 2 diabetes mellitus with diabetic polyneuropathy: Secondary | ICD-10-CM

## 2015-10-01 DIAGNOSIS — Z794 Long term (current) use of insulin: Secondary | ICD-10-CM

## 2015-10-01 LAB — POCT GLUCOSE (DEVICE FOR HOME USE): GLUCOSE FASTING, POC: 190 mg/dL — AB (ref 70–99)

## 2015-10-01 MED ORDER — METHYLPHENIDATE HCL 20 MG PO TABS
20.0000 mg | ORAL_TABLET | Freq: Two times a day (BID) | ORAL | 0 refills | Status: DC
Start: 2015-10-01 — End: 2015-11-26

## 2015-10-01 MED ORDER — FREESTYLE LITE TEST VI STRP: ORAL_STRIP | 12 refills | Status: DC

## 2015-10-01 NOTE — Progress Notes (Signed)
Pre visit review using our clinic review tool, if applicable. No additional management support is needed unless otherwise documented below in the visit note/SLS  

## 2015-10-01 NOTE — Patient Instructions (Signed)
Please continue the Novolog following the sliding scale. Continue your oral diabetes medications. For the [facility], continue increasing by 2 units every 3 days until fasting sugars are averaging 100-130. If you get to 130 units and have not reached goal, please call me. If sugars 80 or less, decrease Toujeo by 3 units.  Please check with the [facility] [phone] to see if they will do a free mammogram for you.

## 2015-10-01 NOTE — Progress Notes (Signed)
Patient presents to clinic today for follow-up of DM II uncontrolled, after adding on short-acting Novolog at meal times with continued titration of Toujeo. Patient endorses taking medications as directed. Is currently on 120 units of Toujeo daily. Is taking Novolog following sliding scale. Is taking her oral medications as directed. Endorses fasting sugars are somewhat improved in the lower 200s but sometimes are in upper 200s depending on meals. Has a dietary log for review. Is still not engaging in any regular exercise.   Past Medical History:  Diagnosis Date  . ADHD (attention deficit hyperactivity disorder)   . Allergic rhinitis   . Allergy   . Diabetes mellitus without complication ([place])   . Diabetic neuropathy ([place])   . GERD (gastroesophageal reflux disease)   . Hypertension   . Hypothyroidism   . RLS (restless legs syndrome)   . Stomach ulcer "it's been a long time"    Current Outpatient Prescriptions on File Prior to Visit  Medication Sig Dispense Refill  . amLODipine (NORVASC) 5 MG tablet Take 1 tablet (5 mg total) by mouth daily. 30 tablet 5  . cetirizine (ZYRTEC) 10 MG tablet Take 10 mg by mouth daily.    Marland Kitchen FREESTYLE LITE test strip 2 (two) times daily. use for testing  2  . gabapentin (NEURONTIN) 600 MG tablet Take 300-600 mg by mouth 3 (three) times daily as needed.    Marland Kitchen glimepiride (AMARYL) 4 MG tablet Take 1 tablet (4 mg total) by mouth 2 (two) times daily before a meal. 60 tablet 5  . Insulin Glargine (TOUJEO SOLOSTAR [place]) Inject 100 Units into the skin at bedtime.     Marland Kitchen levothyroxine (SYNTHROID, LEVOTHROID) 150 MCG tablet TAKE ONE TABLET BY MOUTH ONCE DAILY BEFORE BREAKFAST 30 tablet 5  . losartan (COZAAR) 25 MG tablet Take 0.5 tablets (12.5 mg total) by mouth daily. 45 tablet 1  . metFORMIN (GLUCOPHAGE-XR) 500 MG 24 hr tablet Take 4 tablets (2,000 mg total) by mouth daily with supper.    . methylphenidate (RITALIN) 20 MG tablet Take 1 tablet (20 mg total) by mouth 2  (two) times daily with breakfast and lunch. 60 tablet 0  . multivitamin-iron-minerals-folic acid (CENTRUM) chewable tablet Chew 1 tablet by mouth daily.    . pantoprazole (PROTONIX) 40 MG tablet Take 1 tablet (40 mg total) by mouth 2 (two) times daily. 60 tablet 5  . rOPINIRole (REQUIP) 0.5 MG tablet Take 1 tab in the morning and afternoon. Take 2 tabs at bedtime. 120 tablet 5  . triamcinolone (NASACORT ALLERGY 24HR) 55 MCG/ACT AERO nasal inhaler Place 1 spray into the nose daily.    Marland Kitchen triamterene-hydrochlorothiazide (DYAZIDE) 37.5-25 MG capsule Take 1 each (1 capsule total) by mouth daily. 30 capsule 5   No current facility-administered medications on file prior to visit.     Allergies  Allergen Reactions  . Codeine Hives  . Darvon [Propoxyphene] Hives    "Darvocet"  . Erythromycin     Stomach pain  . Keflex [Cephalexin] Hives    Family History  Problem Relation Age of Onset  . Heart disease Father   . Heart attack Father   . Heart disease Brother     CABG  . Diabetes Brother   . Kidney disease Brother   . Cirrhosis Brother   . Hypertension Brother   . Heart attack Brother   . Heart disease Brother   . Diabetes Brother   . Heart attack Brother   . Diabetes Mother   .  Breast cancer Mother   . Stroke Mother     Social History   Social History  . Marital status: Single    Spouse name: N/A  . Number of children: 2  . Years of education: N/A   Social History Main Topics  . Smoking status: Former Smoker    Packs/day: 2.00    Types: Cigarettes    Start date: 06/20/1973    Quit date: 06/21/1982  . Smokeless tobacco: Never Used  . Alcohol use No  . Drug use: No  . Sexual activity: Not Asked   Other Topics Concern  . None   Social History Narrative  . None    Review of Systems - See HPI.  All other ROS are negative.  BP 138/82 (BP Location: Left Arm, Patient Position: Sitting, Cuff Size: Large)   Pulse 100   Temp 98.2 F (36.8 C) (Oral)   Resp 16   Ht _0   (1.575 m)   Wt 199 lb 8 oz (90.5 kg)   SpO2 97%   BMI 36.49 kg/m   Physical Exam  Constitutional: She is oriented to person, place, and time and well-developed, well-nourished, and in no distress.  HENT:  Head: Normocephalic and atraumatic.  Eyes: Conjunctivae are normal.  Cardiovascular: Normal rate, regular rhythm, normal heart sounds and intact distal pulses.   Pulmonary/Chest: Effort normal and breath sounds normal. No respiratory distress. She has no wheezes. She has no rales. She exhibits no tenderness.  Neurological: She is alert and oriented to person, place, and time.  Skin: Skin is warm and dry. No rash noted.  Psychiatric: Affect normal.  Vitals reviewed.   Recent Results (from the past 2160 hour(s))  TSH     Status: None   Collection Time: 07/25/15  5:02 PM  Result Value Ref Range   TSH 3.44 0.35 - 4.50 uIU/mL  Comp Met (CMET)     Status: Abnormal   Collection Time: 07/25/15  5:02 PM  Result Value Ref Range   Sodium 139 135 - 145 mEq/L   Potassium 4.3 3.5 - 5.1 mEq/L   Chloride 99 96 - 112 mEq/L   CO2 25 19 - 32 mEq/L   Glucose, Bld 273 (H) 70 - 99 mg/dL   BUN 24 (H) 6 - 23 mg/dL   Creatinine, Ser 1.03 0.40 - 1.20 mg/dL   Total Bilirubin 0.5 0.2 - 1.2 mg/dL   Alkaline Phosphatase 115 39 - 117 U/L   AST 97 (H) 0 - 37 U/L   ALT 126 (H) 0 - 35 U/L   Total Protein 8.3 6.0 - 8.3 g/dL   Albumin 4.5 3.5 - 5.2 g/dL   Calcium 10.8 (H) 8.4 - 10.5 mg/dL   GFR 59.14 (L) >60.00 mL/min  Hemoglobin A1c     Status: Abnormal   Collection Time: 07/25/15  5:02 PM  Result Value Ref Range   Hgb A1c MFr Bld 9.9 (H) 4.6 - 6.5 %    Comment: Glycemic Control Guidelines for People with Diabetes:Non Diabetic:  <6%Goal of Therapy: <7%Additional Action Suggested:  >8%   Glucose, capillary     Status: Abnormal   Collection Time: 08/16/15 11:07 AM  Result Value Ref Range   Glucose-Capillary 242 (H) 65 - 99 mg/dL  APTT upon arrival     Status: Abnormal   Collection Time: 08/16/15  11:25 AM  Result Value Ref Range   aPTT 23 (L) 24 - 36 seconds  CBC upon arrival     Status: None  Collection Time: 08/16/15 11:25 AM  Result Value Ref Range   WBC 7.7 4.0 - 10.5 K/uL   RBC 4.59 3.87 - 5.11 MIL/uL   Hemoglobin 13.4 12.0 - 15.0 g/dL   HCT 39.2 36.0 - 46.0 %   MCV 85.4 78.0 - 100.0 fL   MCH 29.2 26.0 - 34.0 pg   MCHC 34.2 30.0 - 36.0 g/dL   RDW 13.1 11.5 - 15.5 %   Platelets 294 150 - 400 K/uL  Protime-INR upon arrival     Status: None   Collection Time: 08/16/15 11:25 AM  Result Value Ref Range   Prothrombin Time 12.7 11.4 - 15.2 seconds   INR 0.95   Glucose, capillary     Status: Abnormal   Collection Time: 08/16/15  2:03 PM  Result Value Ref Range   Glucose-Capillary 170 (H) 65 - 99 mg/dL  CULTURE, URINE COMPREHENSIVE     Status: None   Collection Time: 08/31/15  4:37 PM  Result Value Ref Range   Organism ID, Bacteria NO GROWTH   POCT urinalysis dipstick     Status: Abnormal   Collection Time: 08/31/15  4:41 PM  Result Value Ref Range   Color, UA yellow    Clarity, UA cloudy    Glucose, UA positive 3+    Bilirubin, UA neg    Ketones, UA positive 2+    Spec Grav, UA >=1.030    Blood, UA neg    pH, UA 6.0    Protein, UA neg    Urobilinogen, UA 0.2    Nitrite, UA neg    Leukocytes, UA Negative Negative  POCT CBG (Fasting - Glucose)     Status: Abnormal   Collection Time: 08/31/15  4:45 PM  Result Value Ref Range   Glucose Fasting, POC 291 (A) 70 - 99 mg/dL    Assessment/Plan: 1. Controlled type 2 diabetes mellitus with diabetic polyneuropathy, with long-term current use of insulin (HCC) POC non-fasting glucose at 190. Will continue titration of Toujeo following previous instructions --  Increase by 2 units every 3 nights, stopping once fasting sugars consistently 90-120. Diet is the biggest issue -- eating high amounts of sugars and fried foods. Again discussed dietary recommendations for her to follow. Will check in with her in 2 weeks to see how  sugars are. Lack of insurance and liver dysfunction is inhibiting other medication options at present.  - POCT Glucose (Device for Home Use)   Leeanne Rio, PA-C

## 2015-10-05 ENCOUNTER — Encounter: Payer: Self-pay | Admitting: Physician Assistant

## 2015-10-05 MED ORDER — FREESTYLE LITE TEST VI STRP: ORAL_STRIP | 3 refills | Status: DC

## 2015-10-11 ENCOUNTER — Other Ambulatory Visit: Payer: Self-pay | Admitting: Physician Assistant

## 2015-10-17 ENCOUNTER — Encounter: Payer: Self-pay | Admitting: Physician Assistant

## 2015-10-18 ENCOUNTER — Other Ambulatory Visit: Payer: Self-pay | Admitting: Physician Assistant

## 2015-10-18 MED ORDER — FREESTYLE LITE TEST VI STRP: ORAL_STRIP | 3 refills | Status: DC

## 2015-10-31 ENCOUNTER — Other Ambulatory Visit: Payer: Self-pay | Admitting: Family Medicine

## 2015-10-31 ENCOUNTER — Other Ambulatory Visit: Payer: Self-pay | Admitting: Physician Assistant

## 2015-10-31 NOTE — Telephone Encounter (Signed)
Rx request to pharmacy/SLS  

## 2015-10-31 NOTE — Telephone Encounter (Signed)
Cristina Burns patient

## 2015-11-01 ENCOUNTER — Encounter: Payer: Self-pay | Admitting: Physician Assistant

## 2015-11-01 ENCOUNTER — Other Ambulatory Visit: Payer: Self-pay

## 2015-11-01 MED ORDER — INSULIN GLARGINE 300 UNIT/ML ~~LOC~~ SOPN: 100.0000 [IU] | PEN_INJECTOR | Freq: Every day | SUBCUTANEOUS | 3 refills | Status: DC

## 2015-11-02 ENCOUNTER — Encounter: Payer: Self-pay | Admitting: Physician Assistant

## 2015-11-06 NOTE — Telephone Encounter (Signed)
Spoke with [employer] Patient [employer] at [phone] and was informed that Crist[facility] Burns Rx was set to ship out on Thursday, but they do not have outgoing shipments on Thursdays and/or Fridays, so therefore; the shipment would not have happened until yesterday [Monday 11/05/15] and there is a 3-5 day window after package is shipped for Arrival to patient. Patient informed, understood & agreed; she will come by office and p/u Sample of Novolog [has her name on box in Pediatric refrigerator door]/SLS 10/17

## 2015-11-07 ENCOUNTER — Ambulatory Visit: Payer: Self-pay | Admitting: Physician Assistant

## 2015-11-08 NOTE — Telephone Encounter (Signed)
The patient has been made aware that her insulin has come in, and she said she would pick it up later today. The Toujeo has been placed in the fridge.     KP

## 2015-11-26 ENCOUNTER — Encounter: Payer: Self-pay | Admitting: Physician Assistant

## 2015-11-26 MED ORDER — METHYLPHENIDATE HCL 20 MG PO TABS: 20.0000 mg | ORAL_TABLET | Freq: Two times a day (BID) | ORAL | 0 refills | Status: DC

## 2016-01-01 ENCOUNTER — Encounter: Payer: Self-pay | Admitting: *Deleted

## 2016-01-01 ENCOUNTER — Telehealth: Payer: Self-pay | Admitting: Physician Assistant

## 2016-01-01 NOTE — Telephone Encounter (Signed)
Pt scheduled appt at [facility] office on 01/07/16 w/ PCP.

## 2016-01-01 NOTE — Telephone Encounter (Signed)
Patient is overdue for a follow-up of Diabetes Mellitus.  Please have her schedule in the next month. Remind her we are at the new office. If too far I would want her to transfer care to one of the other providers at [facility] to make sure she has adequate follow-up.

## 2016-01-01 NOTE — Telephone Encounter (Signed)
Called patient and left message to return call. MyChart message sent to pt as well.

## 2016-01-07 ENCOUNTER — Ambulatory Visit (INDEPENDENT_AMBULATORY_CARE_PROVIDER_SITE_OTHER): Payer: Self-pay | Admitting: Physician Assistant

## 2016-01-07 ENCOUNTER — Ambulatory Visit: Payer: Self-pay | Admitting: Physician Assistant

## 2016-01-07 ENCOUNTER — Encounter: Payer: Self-pay | Admitting: Physician Assistant

## 2016-01-07 VITALS — BP 140/78 | HR 108 | Temp 98.3°F | Resp 16 | Ht 62.0 in | Wt 195.0 lb

## 2016-01-07 DIAGNOSIS — Z794 Long term (current) use of insulin: Secondary | ICD-10-CM

## 2016-01-07 DIAGNOSIS — I10 Essential (primary) hypertension: Secondary | ICD-10-CM

## 2016-01-07 DIAGNOSIS — E1142 Type 2 diabetes mellitus with diabetic polyneuropathy: Secondary | ICD-10-CM

## 2016-01-07 LAB — COMPREHENSIVE METABOLIC PANEL
ALT: 28 U/L (ref 6–29)
AST: 25 U/L (ref 10–35)
Albumin: 4.1 g/dL (ref 3.6–5.1)
Alkaline Phosphatase: 118 U/L (ref 33–130)
BUN: 16 mg/dL (ref 7–25)
CHLORIDE: 98 mmol/L (ref 98–110)
CO2: 26 mmol/L (ref 20–31)
Calcium: 9.8 mg/dL (ref 8.6–10.4)
Creat: 0.99 mg/dL (ref 0.50–1.05)
GLUCOSE: 271 mg/dL — AB (ref 65–99)
POTASSIUM: 4 mmol/L (ref 3.5–5.3)
Sodium: 138 mmol/L (ref 135–146)
Total Bilirubin: 0.4 mg/dL (ref 0.2–1.2)
Total Protein: 7.7 g/dL (ref 6.1–8.1)

## 2016-01-07 MED ORDER — METHYLPHENIDATE HCL 20 MG PO TABS: 20.0000 mg | ORAL_TABLET | Freq: Two times a day (BID) | ORAL | 0 refills | Status: DC

## 2016-01-07 NOTE — Progress Notes (Signed)
Pre visit review using our clinic review tool, if applicable. No additional management support is needed unless otherwise documented below in the visit note. 

## 2016-01-07 NOTE — Patient Instructions (Signed)
Please go to the lab for blood work. I will call you with your results.   Please continue chronic medications as directed.  Make sure to keep close watch on the fasting sugars. Goal for now is 80-120. If you are getting readings < 80, decrease toujeo by 3 units..  Follow-up in 3 months.

## 2016-01-07 NOTE — Progress Notes (Signed)
History of Present Illness: Patient is a 55 y.o. female who presents to clinic today for follow-up of Diabetes Mellitus II, uncontrolled.  Patient currently on medication regimen of Metformin, Toujeo and Glimeperide.  Is taking medications as directed. Toujeo currently at 132 units daily. Endorses working harder on her diet -- portion sizes and food choies.  Denies polyuria, polydipsia or polyphagia. Is checking blood glucose as directed. Fasting BS averaging 140s-170s. Non-fasting sugars 200-250s. .   Latest Maintenance: A1C --  Lab Results  Component Value Date   HGBA1C 8.9 (H) 01/07/2016   Diabetic Eye Exam --Overdue. Patient without insurance. Will be getting in January. Foot Exam -- Overdue. Denies complaints. Denies numbness or tingling.   Past Medical History:  Diagnosis Date  . ADHD (attention deficit hyperactivity disorder)   . Allergic rhinitis   . Allergy   . Diabetes mellitus without complication ([place])   . Diabetic neuropathy ([place])   . GERD (gastroesophageal reflux disease)   . Hypertension   . Hypothyroidism   . RLS (restless legs syndrome)   . Stomach ulcer "it's been a long time"    Current Outpatient Prescriptions on File Prior to Visit  Medication Sig Dispense Refill  . amLODipine (NORVASC) 5 MG tablet TAKE ONE TABLET BY MOUTH ONCE DAILY 30 tablet 5  . cetirizine (ZYRTEC) 10 MG tablet Take 10 mg by mouth daily.    Marland Kitchen FREESTYLE LITE test strip USE TO TEST BLOOD GLUCOSE FOUR TIMES DAILY DX: E11.42 200 each 3  . gabapentin (NEURONTIN) 600 MG tablet Take 300-600 mg by mouth 3 (three) times daily as needed.    Marland Kitchen glimepiride (AMARYL) 4 MG tablet Take 1 tablet (4 mg total) by mouth 2 (two) times daily before a meal. 60 tablet 5  . levothyroxine (SYNTHROID, LEVOTHROID) 150 MCG tablet TAKE ONE TABLET BY MOUTH ONCE DAILY BEFORE BREAKFAST 30 tablet 5  . losartan (COZAAR) 25 MG tablet TAKE ONE-HALF TABLET BY MOUTH ONCE DAILY 45 tablet 1  . multivitamin-iron-minerals-folic  acid (CENTRUM) chewable tablet Chew 1 tablet by mouth daily.    . pantoprazole (PROTONIX) 40 MG tablet TAKE ONE TABLET BY MOUTH TWICE DAILY 60 tablet 3  . rOPINIRole (REQUIP) 0.5 MG tablet Take 1 tab in the morning and afternoon. Take 2 tabs at bedtime. (Patient taking differently: Take 1 tab at lunch and evening. Take 2 tabs at bedtime.) 120 tablet 5  . triamcinolone (NASACORT ALLERGY 24HR) 55 MCG/ACT AERO nasal inhaler Place 1 spray into the nose daily.    Marland Kitchen triamterene-hydrochlorothiazide (DYAZIDE) 37.5-25 MG capsule Take 1 each (1 capsule total) by mouth daily. 30 capsule 5   No current facility-administered medications on file prior to visit.     Allergies  Allergen Reactions  . Codeine Hives  . Darvon [Propoxyphene] Hives    "Darvocet"  . Erythromycin     Stomach pain  . Keflex [Cephalexin] Hives    Family History  Problem Relation Age of Onset  . Heart disease Father   . Heart attack Father   . Heart disease Brother     CABG  . Diabetes Brother   . Kidney disease Brother   . Cirrhosis Brother   . Hypertension Brother   . Heart attack Brother   . Heart disease Brother   . Diabetes Brother   . Heart attack Brother   . Diabetes Mother   . Breast cancer Mother   . Stroke Mother     Social History   Social History  .  Marital status: Single    Spouse name: N/A  . Number of children: 2  . Years of education: N/A   Social History Main Topics  . Smoking status: Former Smoker    Packs/day: 2.00    Types: Cigarettes    Start date: 06/20/1973    Quit date: 06/21/1982  . Smokeless tobacco: Never Used  . Alcohol use No  . Drug use: No  . Sexual activity: Not Asked   Other Topics Concern  . None   Social History Narrative  . None    Review of Systems: Pertinent ROS are listed in HPI  Physical Examination: BP 140/78   Pulse (!) 108   Temp 98.3 F (36.8 C) (Oral)   Resp 16   Ht 5\' 2"  (1.575 m)   Wt 195 lb (88.5 kg)   SpO2 97%   BMI 35.67 kg/m  General  appearance: alert, cooperative and appears stated age Head: Normocephalic, without obvious abnormality, atraumatic Eyes: conjunctivae/corneas clear. PERRL, EOM's intact. Fundi benign. Lungs: clear to auscultation bilaterally Heart: regular rate and rhythm, S1, S2 normal, no murmur, click, rub or gallop Extremities: extremities normal, atraumatic, no cyanosis or edema Pulses: 2+ and symmetric Skin: Skin color, texture, turgor normal. No rashes or lesions Neurologic: Grossly normal  Diabetic Foot Exam - Simple   Simple Foot Form Diabetic Foot exam was performed with the following findings:  Yes 01/07/2016  9:41 AM  Visual Inspection No deformities, no ulcerations, no other skin breakdown bilaterally:  Yes Sensation Testing Intact to touch and monofilament testing bilaterally:  Yes Pulse Check Posterior Tibialis and Dorsalis pulse intact bilaterally:  Yes Comments    Assessment/Plan: Essential hypertension BP stable. Will check labs today.  Diabetes mellitus type II, controlled ([place]) Sugars improved. Patient working hard on diet. Discussed exercise regimen. Foot exam updated today. Waiting on insurance to take effect so she can have diabetic eye examination. Will update immunizations at that time. Will check labs today. If liver function improved, will increase her Metformin. Titration guide for Toujeo given. FU scheduled.

## 2016-01-08 LAB — HEMOGLOBIN A1C
HEMOGLOBIN A1C: 8.9 % — AB (ref <5.7)
MEAN PLASMA GLUCOSE: 209 mg/dL

## 2016-01-10 ENCOUNTER — Other Ambulatory Visit: Payer: Self-pay | Admitting: Emergency Medicine

## 2016-01-10 MED ORDER — METFORMIN HCL ER 500 MG PO TB24: ORAL_TABLET | ORAL | 1 refills | Status: DC

## 2016-01-10 MED ORDER — INSULIN GLARGINE 300 UNIT/ML ~~LOC~~ SOPN: 129.0000 [IU] | PEN_INJECTOR | Freq: Every day | SUBCUTANEOUS | 3 refills | Status: DC

## 2016-01-13 NOTE — Assessment & Plan Note (Signed)
Sugars improved. Patient working hard on diet. Discussed exercise regimen. Foot exam updated today. Waiting on insurance to take effect so she can have diabetic eye examination. Will update immunizations at that time. Will check labs today. If liver function improved, will increase her Metformin. Titration guide for Toujeo given. FU scheduled.

## 2016-01-13 NOTE — Assessment & Plan Note (Signed)
BP stable. Will check labs today.

## 2016-01-22 ENCOUNTER — Encounter: Payer: Self-pay | Admitting: Physician Assistant

## 2016-01-28 ENCOUNTER — Other Ambulatory Visit: Payer: Self-pay | Admitting: Physician Assistant

## 2016-01-29 ENCOUNTER — Telehealth: Payer: Self-pay | Admitting: Physician Assistant

## 2016-01-29 NOTE — Telephone Encounter (Signed)
Cristina Burns from [place]: [place], [place]   called to verify that it was alright that the manufactuer changed for pt medication levothyroxine (SYNTHROID, LEVOTHROID) 150 MCG tablet.   Please call pharmacy back at [phone] to discuss. Thank you. -KE

## 2016-01-30 NOTE — Telephone Encounter (Signed)
Called and verbal ok given to change manufacturer on levothyroxine.

## 2016-02-08 ENCOUNTER — Telehealth: Payer: Self-pay | Admitting: Physician Assistant

## 2016-02-08 NOTE — Telephone Encounter (Signed)
Please call patient to pick up her toujeo. It is here at the office.

## 2016-02-08 NOTE — Telephone Encounter (Signed)
LMOVM advising patient her Toujeo patient assistance medication is here in the fridge ready for pick up.

## 2016-02-12 ENCOUNTER — Encounter: Payer: Self-pay | Admitting: Physician Assistant

## 2016-02-13 NOTE — Telephone Encounter (Signed)
Sent My chart message advising medication is ready for pick up in the fridge.

## 2016-02-22 ENCOUNTER — Other Ambulatory Visit: Payer: Self-pay | Admitting: Physician Assistant

## 2016-02-22 MED ORDER — METHYLPHENIDATE HCL 20 MG PO TABS: 20.0000 mg | ORAL_TABLET | Freq: Two times a day (BID) | ORAL | 0 refills | Status: DC

## 2016-02-27 ENCOUNTER — Other Ambulatory Visit: Payer: Self-pay | Admitting: Physician Assistant

## 2016-03-06 ENCOUNTER — Other Ambulatory Visit: Payer: Self-pay | Admitting: Physician Assistant

## 2016-03-31 ENCOUNTER — Other Ambulatory Visit: Payer: Self-pay | Admitting: Physician Assistant

## 2016-04-01 ENCOUNTER — Other Ambulatory Visit: Payer: Self-pay | Admitting: Physician Assistant

## 2016-04-01 MED ORDER — METHYLPHENIDATE HCL 20 MG PO TABS
20.0000 mg | ORAL_TABLET | Freq: Two times a day (BID) | ORAL | 0 refills | Status: DC
Start: 2016-04-01 — End: 2016-05-21

## 2016-04-07 ENCOUNTER — Telehealth: Payer: Self-pay | Admitting: Emergency Medicine

## 2016-04-07 ENCOUNTER — Other Ambulatory Visit: Payer: Self-pay | Admitting: Physician Assistant

## 2016-04-07 ENCOUNTER — Encounter: Payer: Self-pay | Admitting: *Deleted

## 2016-04-07 NOTE — Telephone Encounter (Signed)
PA started thru Cover My Meds: Key: [id number] - PA Case ID: [id number] - Rx #: [id number] Waiting on response from insurance co.    Patient sent a My chart refill request for the Ritalin advising she needed a prior authorization.

## 2016-04-07 NOTE — Telephone Encounter (Signed)
Medication approved through 04/07/2017.    Left patient a voicemail and a mychart message to let her know so that she could contact her pharmacy

## 2016-04-08 ENCOUNTER — Other Ambulatory Visit: Payer: Self-pay | Admitting: Physician Assistant

## 2016-04-15 ENCOUNTER — Encounter: Payer: Self-pay | Admitting: Physician Assistant

## 2016-04-15 NOTE — Telephone Encounter (Signed)
She is due for a follow-up and needs to schedule appt.

## 2016-04-17 ENCOUNTER — Other Ambulatory Visit: Payer: Self-pay | Admitting: Emergency Medicine

## 2016-04-17 MED ORDER — GABAPENTIN 600 MG PO TABS: 600.0000 mg | ORAL_TABLET | Freq: Three times a day (TID) | ORAL | 0 refills | Status: DC

## 2016-04-17 NOTE — Telephone Encounter (Signed)
I am ok with giving her a month of medication but have never filled before so want to verify dose. Is she taking 300 TID or 600 TID?

## 2016-04-28 ENCOUNTER — Encounter: Payer: Self-pay | Admitting: Physician Assistant

## 2016-04-29 ENCOUNTER — Other Ambulatory Visit: Payer: Self-pay | Admitting: Physician Assistant

## 2016-05-05 ENCOUNTER — Other Ambulatory Visit: Payer: Self-pay | Admitting: Physician Assistant

## 2016-05-07 ENCOUNTER — Telehealth: Payer: Self-pay | Admitting: Physician Assistant

## 2016-05-07 ENCOUNTER — Encounter: Payer: Self-pay | Admitting: Physician Assistant

## 2016-05-07 NOTE — Telephone Encounter (Signed)
Can you get this patient scheduled with [facility]

## 2016-05-07 NOTE — Telephone Encounter (Signed)
Patient sent Mychart requesting transfer of care to Dr. Nani Ravens from me due to the [facility] office being so far away from her.  I have approved the transfer. Is this ok with you Dr. Nani Ravens?  Cristina Burns -- so you can help schedule patient once approval has been given.

## 2016-05-07 NOTE — Telephone Encounter (Signed)
Left message on voicemail informing patient that her transfer of care has been approved by both providers and she needs to call our office to schedule an appointment with Dr. Nani Ravens.

## 2016-05-07 NOTE — Telephone Encounter (Signed)
OK with me.

## 2016-05-16 ENCOUNTER — Other Ambulatory Visit: Payer: Self-pay | Admitting: Physician Assistant

## 2016-05-19 ENCOUNTER — Encounter: Payer: Self-pay | Admitting: Physician Assistant

## 2016-05-19 NOTE — Telephone Encounter (Signed)
Ritalin last filled 04/01/16 #60 No CSC No UDS Last ov: 01/07/16 Will be transferring to Dr Nani Ravens Please advise

## 2016-05-21 ENCOUNTER — Encounter: Payer: Self-pay | Admitting: Physician Assistant

## 2016-05-21 ENCOUNTER — Ambulatory Visit (INDEPENDENT_AMBULATORY_CARE_PROVIDER_SITE_OTHER): Payer: PRIVATE HEALTH INSURANCE | Admitting: Physician Assistant

## 2016-05-21 VITALS — BP 134/78 | HR 100 | Temp 98.2°F | Resp 14 | Ht 62.0 in | Wt 190.0 lb

## 2016-05-21 DIAGNOSIS — Z794 Long term (current) use of insulin: Secondary | ICD-10-CM | POA: Diagnosis not present

## 2016-05-21 DIAGNOSIS — E039 Hypothyroidism, unspecified: Secondary | ICD-10-CM

## 2016-05-21 DIAGNOSIS — J019 Acute sinusitis, unspecified: Secondary | ICD-10-CM | POA: Diagnosis not present

## 2016-05-21 DIAGNOSIS — E1142 Type 2 diabetes mellitus with diabetic polyneuropathy: Secondary | ICD-10-CM

## 2016-05-21 DIAGNOSIS — F988 Other specified behavioral and emotional disorders with onset usually occurring in childhood and adolescence: Secondary | ICD-10-CM

## 2016-05-21 DIAGNOSIS — B9689 Other specified bacterial agents as the cause of diseases classified elsewhere: Secondary | ICD-10-CM | POA: Diagnosis not present

## 2016-05-21 LAB — COMPREHENSIVE METABOLIC PANEL
ALT: 31 U/L (ref 0–35)
AST: 26 U/L (ref 0–37)
Albumin: 4.2 g/dL (ref 3.5–5.2)
Alkaline Phosphatase: 125 U/L — ABNORMAL HIGH (ref 39–117)
BILIRUBIN TOTAL: 0.3 mg/dL (ref 0.2–1.2)
BUN: 24 mg/dL — ABNORMAL HIGH (ref 6–23)
CALCIUM: 9.9 mg/dL (ref 8.4–10.5)
CO2: 24 meq/L (ref 19–32)
Chloride: 99 mEq/L (ref 96–112)
Creatinine, Ser: 1 mg/dL (ref 0.40–1.20)
GFR: 61 mL/min (ref >60.00)
Glucose, Bld: 348 mg/dL — ABNORMAL HIGH (ref 70–99)
Potassium: 4.3 mEq/L (ref 3.5–5.1)
Sodium: 135 mEq/L (ref 135–145)
Total Protein: 7.6 g/dL (ref 6.0–8.3)

## 2016-05-21 LAB — LIPID PANEL
CHOL/HDL RATIO: 5
Cholesterol: 192 mg/dL (ref 0–200)
HDL: 38.6 mg/dL — ABNORMAL LOW (ref >39.00)
NonHDL: 153.13
TRIGLYCERIDES: 250 mg/dL — AB (ref 0.0–149.0)
VLDL: 50 mg/dL — AB (ref 0.0–40.0)

## 2016-05-21 LAB — LDL CHOLESTEROL, DIRECT: LDL DIRECT: 111 mg/dL

## 2016-05-21 LAB — HEMOGLOBIN A1C: HEMOGLOBIN A1C: 9.8 % — AB (ref 4.6–6.5)

## 2016-05-21 LAB — TSH: TSH: 0.64 u[IU]/mL (ref 0.35–4.50)

## 2016-05-21 MED ORDER — METHYLPHENIDATE HCL 20 MG PO TABS: 20.0000 mg | ORAL_TABLET | Freq: Two times a day (BID) | ORAL | 0 refills | Status: DC

## 2016-05-21 MED ORDER — DOXYCYCLINE HYCLATE 100 MG PO CAPS: 100.0000 mg | ORAL_CAPSULE | Freq: Two times a day (BID) | ORAL | 0 refills | Status: DC

## 2016-05-21 MED ORDER — ALBUTEROL SULFATE HFA 108 (90 BASE) MCG/ACT IN AERS: 2.0000 | INHALATION_SPRAY | Freq: Four times a day (QID) | RESPIRATORY_TRACT | 0 refills | Status: DC | PRN

## 2016-05-21 MED ORDER — INSULIN GLARGINE 300 UNIT/ML ~~LOC~~ SOPN: 129.0000 [IU] | PEN_INJECTOR | Freq: Every day | SUBCUTANEOUS | 3 refills | Status: DC

## 2016-05-21 NOTE — Progress Notes (Signed)
History of Present Illness: Patient is a 56 y.o. female who presents to clinic today for follow-up of Diabetes Mellitus II.  Patient currently on medication regimen of Toujeo taking 130 units of Toujeo daily.  Is taking oralmedications as directed. Endorses watching her diet some of the time but still making poor choices. Denies exercise.. Is not checking blood glucose multiple times per day as directed. Only occasionally checking fasting glucose. Stopped Novolin but cannot give reason for doing this. Has been out of Toujeo for a week. States sugars were running 115-130 before then. Now 200-300. Patient gets Toujeo for free through patient assistance with [employer].  Latest Maintenance: A1C --  Lab Results  Component Value Date   HGBA1C 9.8 (H) 05/21/2016   Diabetic Eye Exam -- overdue. Patient agrees to referral for eye examination.  Foot Exam -- up-to-date  Mammogram -- Dr. Sharlyn Bologna. [facility].  Patient also here for follow-up of ADHD. Is currently on Ritalin 20 mg BID. Is taking as directed. Has been on this regimen for several years.. Endorses tolerating well with good focus. Denies side effect of medication.   Patient also notes several days of chest congestion with cough that is mostly dry but sometimes productive of clear sputum. Endorses significant sinus pressure with sinus pain and thick nasal drainage that is making her nauseated. Denies chest pain but notes. Denies fever.   Past Medical History:  Diagnosis Date  . ADHD (attention deficit hyperactivity disorder)   . Allergic rhinitis   . Allergy   . Diabetes mellitus without complication ([place])   . Diabetic neuropathy ([place])   . GERD (gastroesophageal reflux disease)   . Hypertension   . Hypothyroidism   . RLS (restless legs syndrome)   . Stomach ulcer "it's been a long time"    Current Outpatient Prescriptions on File Prior to Visit  Medication Sig Dispense Refill  . amLODipine (NORVASC) 5 MG tablet TAKE ONE TABLET BY MOUTH  ONCE DAILY 30 tablet 5  . cetirizine (ZYRTEC) 10 MG tablet Take 10 mg by mouth daily.    Marland Kitchen FREESTYLE LITE test strip USE TO TEST BLOOD GLUCOSE FOUR TIMES DAILY DX: E11.42 200 each 3  . gabapentin (NEURONTIN) 600 MG tablet Take 1 tablet (600 mg total) by mouth 3 (three) times daily. 90 tablet 0  . glimepiride (AMARYL) 4 MG tablet TAKE ONE TABLET BY MOUTH TWICE DAILY BEFORE MEAL(S) 60 tablet 5  . levothyroxine (SYNTHROID, LEVOTHROID) 150 MCG tablet TAKE ONE TABLET BY MOUTH ONCE DAILY BEFORE BREAKFAST 30 tablet 5  . losartan (COZAAR) 25 MG tablet TAKE 1/2 (ONE-HALF) TABLET BY MOUTH ONCE DAILY 30 tablet 0  . metFORMIN (GLUCOPHAGE-XR) 500 MG 24 hr tablet TAKE FOUR TABLETS BY MOUTH ONCE DAILY 360 tablet 1  . multivitamin-iron-minerals-folic acid (CENTRUM) chewable tablet Chew 1 tablet by mouth daily.    . pantoprazole (PROTONIX) 40 MG tablet TAKE ONE TABLET BY MOUTH TWICE DAILY 180 tablet 0  . rOPINIRole (REQUIP) 0.5 MG tablet TAKE ONE TABLET BY MOUTH IN THE MORNING, ONE TABLET IN THE AFTERNOON, AND TWO TABLETS ONCE DAILY AT BEDTIME 120 tablet 5  . triamcinolone (NASACORT ALLERGY 24HR) 55 MCG/ACT AERO nasal inhaler Place 1 spray into the nose daily.    Marland Kitchen triamterene-hydrochlorothiazide (DYAZIDE) 37.5-25 MG capsule Take 1 each (1 capsule total) by mouth daily. 30 capsule 5   No current facility-administered medications on file prior to visit.     Allergies  Allergen Reactions  . Codeine Hives  . Darvon [Propoxyphene] Hives    "  Darvocet"  . Erythromycin     Stomach pain  . Keflex [Cephalexin] Hives    Family History  Problem Relation Age of Onset  . Heart disease Father   . Heart attack Father   . Heart disease Brother        CABG  . Diabetes Brother   . Kidney disease Brother   . Cirrhosis Brother   . Hypertension Brother   . Heart attack Brother   . Heart disease Brother   . Diabetes Brother   . Heart attack Brother   . Diabetes Mother   . Breast cancer Mother   . Stroke Mother       Social History   Social History  . Marital status: Single    Spouse name: N/A  . Number of children: 2  . Years of education: N/A   Social History Main Topics  . Smoking status: Former Smoker    Packs/day: 2.00    Types: Cigarettes    Start date: 06/20/1973    Quit date: 06/21/1982  . Smokeless tobacco: Never Used  . Alcohol use No  . Drug use: No  . Sexual activity: Not Asked   Other Topics Concern  . None   Social History Narrative  . None   Review of Systems: Pertinent ROS are listed in HPI  Physical Examination: BP 134/78   Pulse 100   Temp 98.2 F (36.8 C) (Oral)   Resp 14   Ht 5\' 2"  (1.575 m)   Wt 190 lb (86.2 kg)   SpO2 96%   BMI 34.75 kg/m  General appearance: alert, cooperative, appears stated age and no distress Head: Normocephalic, without obvious abnormality, atraumatic Ears: normal TM's and external ear canals both ears Nose: moderate congestion, turbinates red, swollen Throat: lips, mucosa, and tongue normal; teeth and gums normal Lungs: clear to auscultation bilaterally Heart: regular rate and rhythm, S1, S2 normal, no murmur, click, rub or gallop Extremities: extremities normal, atraumatic, no cyanosis or edema Pulses: 2+ and symmetric Skin: Skin color, texture, turgor normal. No rashes or lesions Neurologic: Alert and oriented X 3, normal strength and tone. Normal symmetric reflexes. Normal coordination and gait   Assessment/Plan: Hypothyroidism Repeat TSH today.  Diabetes mellitus type II, controlled ([place]) Non-adherent to regimen.  Toujeo restarted. Novolin as directed. Oral medications as directed. Labs today.  Referral to Ophthalmology placed. Patient encouraged to follow-up with her Endocrinologist as previously discussed. She states she will give them a call to schedule.   Acute bacterial sinusitis Rx Doxycycline.  Increase fluids.  Rest.  Saline nasal spray.  Probiotic.  Mucinex as directed.  Humidifier in bedroom.  Call or  return to clinic if symptoms are not improving.   Attention deficit disorder Doing well. Continue current regimen.

## 2016-05-21 NOTE — Patient Instructions (Addendum)
Please take antibiotic as directed.  Increase fluid intake.  Use Saline nasal spray.  Take a daily multivitamin. Use Tessalon as directed for cough.  Place a humidifier in the bedroom.  Please call or return clinic if symptoms are not improving.  Please continue Ritalin as directed.  Please go to the lab for blood work. I will call you with your results. We are checking on the status of your delivery of Toujeo.  I do not have any samples today. We are sending in a short-term supply to your local pharmacy while we are getting in touch with [employer].   The ladies at the front desk will help you schedule an appointment with Dr. Nani Ravens. Do this ASAP.   I am sending you back to a different Endocrinologist for assessment.   Sinusitis Sinusitis is redness, soreness, and swelling (inflammation) of the paranasal sinuses. Paranasal sinuses are air pockets within the bones of your face (beneath the eyes, the middle of the forehead, or above the eyes). In healthy paranasal sinuses, mucus is able to drain out, and air is able to circulate through them by way of your nose. However, when your paranasal sinuses are inflamed, mucus and air can become trapped. This can allow bacteria and other germs to grow and cause infection. Sinusitis can develop quickly and last only a short time (acute) or continue over a long period (chronic). Sinusitis that lasts for more than 12 weeks is considered chronic.  CAUSES  Causes of sinusitis include:  Allergies.  Structural abnormalities, such as displacement of the cartilage that separates your nostrils (deviated septum), which can decrease the air flow through your nose and sinuses and affect sinus drainage.  Functional abnormalities, such as when the small hairs (cilia) that line your sinuses and help remove mucus do not work properly or are not present. SYMPTOMS  Symptoms of acute and chronic sinusitis are the same. The primary symptoms are pain and pressure around the  affected sinuses. Other symptoms include:  Upper toothache.  Earache.  Headache.  Bad breath.  Decreased sense of smell and taste.  A cough, which worsens when you are lying flat.  Fatigue.  Fever.  Thick drainage from your nose, which often is green and may contain pus (purulent).  Swelling and warmth over the affected sinuses. DIAGNOSIS  Your caregiver will perform a physical exam. During the exam, your caregiver may:  Look in your nose for signs of abnormal growths in your nostrils (nasal polyps).  Tap over the affected sinus to check for signs of infection.  View the inside of your sinuses (endoscopy) with a special imaging device with a light attached (endoscope), which is inserted into your sinuses. If your caregiver suspects that you have chronic sinusitis, one or more of the following tests may be recommended:  Allergy tests.  Nasal culture A sample of mucus is taken from your nose and sent to a lab and screened for bacteria.  Nasal cytology A sample of mucus is taken from your nose and examined by your caregiver to determine if your sinusitis is related to an allergy. TREATMENT  Most cases of acute sinusitis are related to a viral infection and will resolve on their own within 10 days. Sometimes medicines are prescribed to help relieve symptoms (pain medicine, decongestants, nasal steroid sprays, or saline sprays).  However, for sinusitis related to a bacterial infection, your caregiver will prescribe antibiotic medicines. These are medicines that will help kill the bacteria causing the infection.  Rarely, sinusitis  is caused by a fungal infection. In theses cases, your caregiver will prescribe antifungal medicine. For some cases of chronic sinusitis, surgery is needed. Generally, these are cases in which sinusitis recurs more than 3 times per year, despite other treatments. HOME CARE INSTRUCTIONS   Drink plenty of water. Water helps thin the mucus so your sinuses  can drain more easily.  Use a humidifier.  Inhale steam 3 to 4 times a day (for example, sit in the bathroom with the shower running).  Apply a warm, moist washcloth to your face 3 to 4 times a day, or as directed by your caregiver.  Use saline nasal sprays to help moisten and clean your sinuses.  Take over-the-counter or prescription medicines for pain, discomfort, or fever only as directed by your caregiver. SEEK IMMEDIATE MEDICAL CARE IF:  You have increasing pain or severe headaches.  You have nausea, vomiting, or drowsiness.  You have swelling around your face.  You have vision problems.  You have a stiff neck.  You have difficulty breathing. MAKE SURE YOU:   Understand these instructions.  Will watch your condition.  Will get help right away if you are not doing well or get worse. Document Released: 01/06/2005 Document Revised: 03/31/2011 Document Reviewed: 01/21/2011 [facility] Patient Information 2014 [place], [place].

## 2016-05-21 NOTE — Progress Notes (Signed)
Pre visit review using our clinic review tool, if applicable. No additional management support is needed unless otherwise documented below in the visit note. 

## 2016-05-22 ENCOUNTER — Encounter: Payer: Self-pay | Admitting: Physician Assistant

## 2016-05-22 MED ORDER — BENZONATATE 100 MG PO CAPS: 100.0000 mg | ORAL_CAPSULE | Freq: Two times a day (BID) | ORAL | 0 refills | Status: DC | PRN

## 2016-05-22 MED ORDER — INSULIN GLARGINE 300 UNIT/ML ~~LOC~~ SOPN: 129.0000 [IU] | PEN_INJECTOR | Freq: Every day | SUBCUTANEOUS | 3 refills | Status: DC

## 2016-05-23 ENCOUNTER — Other Ambulatory Visit (INDEPENDENT_AMBULATORY_CARE_PROVIDER_SITE_OTHER): Payer: PRIVATE HEALTH INSURANCE

## 2016-05-23 DIAGNOSIS — D748 Other methemoglobinemias: Secondary | ICD-10-CM

## 2016-05-23 LAB — VITAMIN D 25 HYDROXY (VIT D DEFICIENCY, FRACTURES): VITD: 30.28 ng/mL (ref 30.00–100.00)

## 2016-05-28 ENCOUNTER — Other Ambulatory Visit: Payer: Self-pay | Admitting: Physician Assistant

## 2016-05-28 DIAGNOSIS — E785 Hyperlipidemia, unspecified: Secondary | ICD-10-CM

## 2016-05-28 DIAGNOSIS — E1142 Type 2 diabetes mellitus with diabetic polyneuropathy: Secondary | ICD-10-CM

## 2016-05-28 DIAGNOSIS — Z794 Long term (current) use of insulin: Secondary | ICD-10-CM

## 2016-05-28 MED ORDER — VITAMIN D3 25 MCG (1000 UNIT) PO TABS: 1000.0000 [IU] | ORAL_TABLET | Freq: Every day | ORAL | 0 refills | Status: DC

## 2016-05-28 MED ORDER — ATORVASTATIN CALCIUM 10 MG PO TABS: 10.0000 mg | ORAL_TABLET | Freq: Every day | ORAL | 1 refills | Status: DC

## 2016-05-29 DIAGNOSIS — F988 Other specified behavioral and emotional disorders with onset usually occurring in childhood and adolescence: Secondary | ICD-10-CM | POA: Insufficient documentation

## 2016-05-29 DIAGNOSIS — B9689 Other specified bacterial agents as the cause of diseases classified elsewhere: Secondary | ICD-10-CM | POA: Insufficient documentation

## 2016-05-29 DIAGNOSIS — J019 Acute sinusitis, unspecified: Principal | ICD-10-CM

## 2016-05-29 NOTE — Assessment & Plan Note (Addendum)
Non-adherent to regimen.  Toujeo restarted. Novolin as directed. Oral medications as directed. Labs today.  Referral to Ophthalmology placed. Patient encouraged to follow-up with her Endocrinologist as previously discussed. She states she will give them a call to schedule.

## 2016-05-29 NOTE — Assessment & Plan Note (Signed)
Repeat TSH today

## 2016-05-29 NOTE — Assessment & Plan Note (Signed)
Rx Doxycycline.  Increase fluids.  Rest.  Saline nasal spray.  Probiotic.  Mucinex as directed.  Humidifier in bedroom.  Call or return to clinic if symptoms are not improving.  

## 2016-05-29 NOTE — Assessment & Plan Note (Signed)
Doing well. Continue current regimen.  

## 2016-06-02 ENCOUNTER — Other Ambulatory Visit: Payer: Self-pay | Admitting: Physician Assistant

## 2016-06-11 ENCOUNTER — Encounter: Payer: Self-pay | Admitting: Physician Assistant

## 2016-06-14 ENCOUNTER — Other Ambulatory Visit: Payer: Self-pay | Admitting: Physician Assistant

## 2016-07-03 ENCOUNTER — Other Ambulatory Visit: Payer: Self-pay | Admitting: Physician Assistant

## 2016-07-04 ENCOUNTER — Encounter: Payer: Self-pay | Admitting: Physician Assistant

## 2016-07-04 NOTE — Telephone Encounter (Signed)
Last OV 05/21/16 Methylphenidate last filled 05/21/16 #60 with 0  Please advise on the ER strength and the work note for desk.

## 2016-07-07 ENCOUNTER — Encounter: Payer: Self-pay | Admitting: Physician Assistant

## 2016-07-08 ENCOUNTER — Telehealth: Payer: Self-pay | Admitting: Emergency Medicine

## 2016-07-08 MED ORDER — METHYLPHENIDATE HCL 20 MG PO TABS: 20.0000 mg | ORAL_TABLET | Freq: Two times a day (BID) | ORAL | 0 refills | Status: DC

## 2016-07-08 NOTE — Addendum Note (Signed)
Addended by: Brunetta Jeans on: 07/08/2016 04:13 PM   Modules accepted: Orders

## 2016-07-08 NOTE — Telephone Encounter (Signed)
error 

## 2016-07-08 NOTE — Telephone Encounter (Signed)
Patient requesting a refill on Methylphendiate 20 mg bid #60 Last refilled 05/21/16 #60

## 2016-07-11 ENCOUNTER — Telehealth: Payer: Self-pay | Admitting: Physician Assistant

## 2016-07-11 DIAGNOSIS — J019 Acute sinusitis, unspecified: Principal | ICD-10-CM

## 2016-07-11 DIAGNOSIS — B9689 Other specified bacterial agents as the cause of diseases classified elsewhere: Secondary | ICD-10-CM

## 2016-07-11 NOTE — Telephone Encounter (Signed)
Patient states that she only takes the medication as needed.   Advised patient that she needed a follow-up and she stated that she was just out here and had some labs done.  She stated that she has a NP appointment in [facility] transferring because she cannot drive the 45 minutes out here to [place].   She is not wanting to make a follow-up appointment for medication since she was just here and she's wanting to transfer (has appointment in August)

## 2016-07-11 NOTE — Telephone Encounter (Signed)
Pt asking if a refill for gabapentin could be called into pharmacy, [facility] on [address]

## 2016-07-11 NOTE — Telephone Encounter (Signed)
She needs follow-up. Seems if she were taking the gabapentin as directed she would have ran out months ago. She is also overdue for follow-up regarding lab results at last visit

## 2016-07-11 NOTE — Telephone Encounter (Signed)
  Okay to refill?  Last filled 04/17/2016  90 with 0 refills

## 2016-07-13 ENCOUNTER — Other Ambulatory Visit: Payer: Self-pay | Admitting: Physician Assistant

## 2016-07-13 NOTE — Telephone Encounter (Signed)
Gabapentin is not an as-needed medication for what she is taking it for. Recommend she start taking once daily. Ok to send in 30 tablets. Needs follow-up or transfer of care appointment before additional refills.

## 2016-07-14 NOTE — Telephone Encounter (Signed)
Left message for patient to call.  Need to discuss provider notes below and verify pharmacy.

## 2016-07-17 ENCOUNTER — Encounter: Payer: Self-pay | Admitting: *Deleted

## 2016-07-17 NOTE — Telephone Encounter (Signed)
MyChart message was sent to patient asking that she respond or call me for details on the medication requested.

## 2016-07-17 NOTE — Telephone Encounter (Signed)
Attempted to call patient on 07/16/16 to discuss the notes below.   No answer, VM was left.

## 2016-07-18 ENCOUNTER — Other Ambulatory Visit: Payer: Self-pay | Admitting: *Deleted

## 2016-07-18 DIAGNOSIS — R52 Pain, unspecified: Secondary | ICD-10-CM

## 2016-07-18 MED ORDER — GABAPENTIN 600 MG PO TABS: 600.0000 mg | ORAL_TABLET | Freq: Every day | ORAL | 0 refills | Status: DC

## 2016-07-18 NOTE — Telephone Encounter (Signed)
I have tried to reach patient via telephone and mychart - she has not responded to any of my messages.  Medication will not be called in until I can speak with her about how she should be taking it, as well as verifying pharmacy.  Should patient return my call, I will send in medication that was approved by current PCP.

## 2016-07-18 NOTE — Telephone Encounter (Signed)
Patient responded via mychart.  Medication has been called in.

## 2016-08-12 ENCOUNTER — Other Ambulatory Visit: Payer: Self-pay | Admitting: Physician Assistant

## 2016-08-21 ENCOUNTER — Encounter: Payer: Self-pay | Admitting: Family Medicine

## 2016-08-21 ENCOUNTER — Telehealth: Payer: Self-pay | Admitting: Physician Assistant

## 2016-08-21 ENCOUNTER — Ambulatory Visit (INDEPENDENT_AMBULATORY_CARE_PROVIDER_SITE_OTHER): Payer: PRIVATE HEALTH INSURANCE | Admitting: Family Medicine

## 2016-08-21 VITALS — BP 148/63 | HR 107 | Temp 98.8°F | Ht 62.0 in | Wt 194.6 lb

## 2016-08-21 DIAGNOSIS — Z794 Long term (current) use of insulin: Secondary | ICD-10-CM

## 2016-08-21 DIAGNOSIS — R000 Tachycardia, unspecified: Secondary | ICD-10-CM | POA: Diagnosis not present

## 2016-08-21 DIAGNOSIS — I10 Essential (primary) hypertension: Secondary | ICD-10-CM

## 2016-08-21 DIAGNOSIS — E1165 Type 2 diabetes mellitus with hyperglycemia: Secondary | ICD-10-CM

## 2016-08-21 DIAGNOSIS — F988 Other specified behavioral and emotional disorders with onset usually occurring in childhood and adolescence: Secondary | ICD-10-CM

## 2016-08-21 MED ORDER — INSULIN GLARGINE 300 UNIT/ML ~~LOC~~ SOPN
68.0000 [IU] | PEN_INJECTOR | Freq: Two times a day (BID) | SUBCUTANEOUS | 3 refills | Status: DC
Start: 2016-08-21 — End: 2016-08-21

## 2016-08-21 MED ORDER — METHYLPHENIDATE HCL ER (LA) 20 MG PO CP24: 20.0000 mg | ORAL_CAPSULE | ORAL | 0 refills | Status: DC

## 2016-08-21 MED ORDER — INSULIN ASPART 100 UNIT/ML FLEXPEN: PEN_INJECTOR | SUBCUTANEOUS | 11 refills | Status: DC

## 2016-08-21 NOTE — Patient Instructions (Signed)
Use sliding scale for meals with Novolog. Check sugars 4 times daily. Bring me the results.  Check blood pressure 3 times per week. Bring readings to appt.  Aim to do some physical exertion for 150 minutes per week. This is typically divided into 5 days per week, 30 minutes per day. The activity should be enough to get your heart rate up. Anything is better than nothing if you have time constraints.   Healthy Eating Plan Many factors influence your heart health, including eating and exercise habits. Heart (coronary) risk increases with abnormal blood fat (lipid) levels. Heart-healthy meal planning includes limiting unhealthy fats, increasing healthy fats, and making other small dietary changes. This includes maintaining a healthy body weight to help keep lipid levels within a normal range.  WHAT IS MY PLAN?  Your health care provider recommends that you:  Drink a glass of water before meals to help with satiety.  Eat slowly.  An alternative to the water is to add Metamucil. This will help with satiety as well. It does contain calories, unlike water.  WHAT TYPES OF FAT SHOULD I CHOOSE?  Choose healthy fats more often. Choose monounsaturated and polyunsaturated fats, such as olive oil and canola oil, flaxseeds, walnuts, almonds, and seeds.  Eat more omega-3 fats. Good choices include salmon, mackerel, sardines, tuna, flaxseed oil, and ground flaxseeds. Aim to eat fish at least two times each week.  Avoid foods with partially hydrogenated oils in them. These contain trans fats. Examples of foods that contain trans fats are stick margarine, some tub margarines, cookies, crackers, and other baked goods. If you are going to avoid a fat, this is the one to avoid!  WHAT GENERAL GUIDELINES DO I NEED TO FOLLOW?  Check food labels carefully to identify foods with trans fats. Avoid these types of options when possible.  Fill one half of your plate with vegetables and green salads. Eat 4-5 servings  of vegetables per day. A serving of vegetables equals 1 cup of raw leafy vegetables,  cup of raw or cooked cut-up vegetables, or  cup of vegetable juice.  Fill one fourth of your plate with whole grains. Look for the word "whole" as the first word in the ingredient list.  Fill one fourth of your plate with lean protein foods.  Eat 4-5 servings of fruit per day. A serving of fruit equals one medium whole fruit,  cup of dried fruit,  cup of fresh, frozen, or canned fruit. Try to avoid fruits in cups/syrups as the sugar content can be high.  Eat more foods that contain soluble fiber. Examples of foods that contain this type of fiber are apples, broccoli, carrots, beans, peas, and barley. Aim to get 20-30 g of fiber per day.  Eat more home-cooked food and less restaurant, buffet, and fast food.  Limit or avoid alcohol.  Limit foods that are high in starch and sugar.  Avoid fried foods when able.  Cook foods by using methods other than frying. Baking, boiling, grilling, and broiling are all great options. Other fat-reducing suggestions include: ? Removing the skin from poultry. ? Removing all visible fats from meats. ? Skimming the fat off of stews, soups, and gravies before serving them. ? Steaming vegetables in water or broth.  Lose weight if you are overweight. Losing just 5-10% of your initial body weight can help your overall health and prevent diseases such as diabetes and heart disease.  Increase your consumption of nuts, legumes, and seeds to 4-5 servings per  week. One serving of dried beans or legumes equals  cup after being cooked, one serving of nuts equals 1 ounces, and one serving of seeds equals  ounce or 1 tablespoon.  WHAT ARE GOOD FOODS CAN I EAT? Grains Grainy breads (try to find bread that is 3 g of fiber per slice or greater), oatmeal, light popcorn. Whole-grain cereals. Rice and pasta, including brown rice and those that are made with whole wheat. Edamame pasta is  a great alternative to grain pasta. It has a higher protein content. Try to avoid significant consumption of white bread, sugary cereals, or pastries/baked goods.  Vegetables All vegetables. Cooked white potatoes do not count as vegetables.  Fruits All fruits, but limit pineapple and bananas as these fruits have a higher sugar content.  Meats and Other Protein Sources Lean, well-trimmed beef, veal, pork, and lamb. Chicken and [place] without skin. All fish and shellfish. Wild duck, rabbit, pheasant, and venison. Egg whites or low-cholesterol egg substitutes. Dried beans, peas, lentils, and tofu.Seeds and most nuts.  Dairy Low-fat or nonfat cheeses, including ricotta, string, and mozzarella. Skim or 1% milk that is liquid, powdered, or evaporated. Buttermilk that is made with low-fat milk. Nonfat or low-fat yogurt. Soy/Almond milk are good alternatives if you cannot handle dairy.  Beverages Water is the best for you. Sports drinks with less sugar are more desirable unless you are a highly active athlete.  Sweets and Desserts Sherbets and fruit ices. Honey, jam, marmalade, jelly, and syrups. Dark chocolate.  Eat all sweets and desserts in moderation.  Fats and Oils Nonhydrogenated (trans-free) margarines. Vegetable oils, including soybean, sesame, sunflower, olive, peanut, safflower, corn, canola, and cottonseed. Salad dressings or mayonnaise that are made with a vegetable oil. Limit added fats and oils that you use for cooking, baking, salads, and as spreads.  Other Cocoa powder. Coffee and tea. Most condiments.  The items listed above may not be a complete list of recommended foods or beverages. Contact your dietitian for more options.

## 2016-08-21 NOTE — Telephone Encounter (Signed)
°  Relation to TJ:QZES Call back McRoberts:  Reason for call:  Patient was seen today and would like all medications and insulin aspart (NOVOLOG) 100 UNIT/ML FlexPen, Insulin Glargine (TOUJEO SOLOSTAR) 300 UNIT/ML SOPN and methylphenidate (RITALIN LA) 20 MG 24 hr capsule   Please send to  [facility], [place] (Phone) [phone] (Fax)    and remove [facility]

## 2016-08-21 NOTE — Progress Notes (Signed)
Subjective:   Chief Complaint  Patient presents with  . Transitions Of Care    Patient is here today for a 3 month F/U DM.    Cristina Burns is a 56 y.o. female here for follow-up of diabetes.   Cristina Burns's self monitored glucose range is 200's. Patient denies hypoglycemic reactions. She checks her glucose levels 4 times per day. Patient does require insulin.   Medications include: Metformin 500 mg XR, Maryl 4 mg BID. Patient exercises 0 days per week on average.   She does take an aspirin daily. Statin? Yes ACEi/ARB? Yes  Past Medical History:  Diagnosis Date  . ADHD (attention deficit hyperactivity disorder)   . Allergic rhinitis   . Allergy   . Diabetes mellitus without complication ([place])   . Diabetic neuropathy ([place])   . GERD (gastroesophageal reflux disease)   . Hypertension   . Hypothyroidism   . RLS (restless legs syndrome)   . Stomach ulcer "it's been a long time"    Past Surgical History:  Procedure Laterality Date  . ABDOMINAL HYSTERECTOMY  2002  . ACHILLES TENDON REPAIR  2009  . CHOLECYSTECTOMY  1982  . ROTATOR CUFF REPAIR Right 2011    Social History   Social History  . Marital status: Single   Social History Main Topics  . Smoking status: Former Smoker    Packs/day: 2.00    Types: Cigarettes    Start date: 06/20/1973    Quit date: 06/21/1982  . Smokeless tobacco: Never Used  . Alcohol use No  . Drug use: No   Current Outpatient Prescriptions on File Prior to Visit  Medication Sig Dispense Refill  . amLODipine (NORVASC) 5 MG tablet TAKE ONE TABLET BY MOUTH ONCE DAILY 30 tablet 5  . atorvastatin (LIPITOR) 10 MG tablet Take 1 tablet (10 mg total) by mouth daily. 30 tablet 1  . cetirizine (ZYRTEC) 10 MG tablet Take 10 mg by mouth daily.    Marland Kitchen FREESTYLE LITE test strip USE TO TEST BLOOD GLUCOSE FOUR TIMES DAILY DX: E11.42 200 each 3  . gabapentin (NEURONTIN) 600 MG tablet Take 1 tablet (600 mg total) by mouth daily. 30 tablet 0  . glimepiride (AMARYL) 4  MG tablet TAKE ONE TABLET BY MOUTH TWICE DAILY BEFORE MEAL(S) 60 tablet 5  . Insulin Glargine (TOUJEO SOLOSTAR) 300 UNIT/ML SOPN Inject 129 Units into the skin at bedtime. 3 mL 3  . levothyroxine (SYNTHROID, LEVOTHROID) 150 MCG tablet TAKE ONE TABLET BY MOUTH ONCE DAILY BEFORE BREAKFAST 90 tablet 0  . losartan (COZAAR) 25 MG tablet TAKE 1/2 (ONE-HALF) TABLET BY MOUTH ONCE DAILY 45 tablet 0  . metFORMIN (GLUCOPHAGE-XR) 500 MG 24 hr tablet TAKE FOUR TABLETS BY MOUTH ONCE DAILY 360 tablet 0  . methylphenidate (RITALIN) 20 MG tablet Take 1 tablet (20 mg total) by mouth 2 (two) times daily with breakfast and lunch. 60 tablet 0  . multivitamin-iron-minerals-folic acid (CENTRUM) chewable tablet Chew 1 tablet by mouth daily.    . pantoprazole (PROTONIX) 40 MG tablet TAKE ONE TABLET BY MOUTH TWICE DAILY 180 tablet 0  . rOPINIRole (REQUIP) 0.5 MG tablet TAKE ONE TABLET BY MOUTH IN THE MORNING, ONE TABLET IN THE AFTERNOON, AND TWO TABLETS ONCE DAILY AT BEDTIME 120 tablet 5  . triamcinolone (NASACORT ALLERGY 24HR) 55 MCG/ACT AERO nasal inhaler Place 1 spray into the nose daily.    Marland Kitchen triamterene-hydrochlorothiazide (MAXZIDE-25) 37.5-25 MG tablet TAKE 1 TABLET BY MOUTH ONCE DAILY. PATIENT NEEDS AN APPT FOR FURTHER REFILLS 90  tablet 0  . cholecalciferol (VITAMIN D) 1000 units tablet Take 1 tablet (1,000 Units total) by mouth daily. (Patient not taking: Reported on 08/21/2016) 30 tablet 0   Related testing: Foot exam(monofilament and inspection):done Date of retinal exam: 2015  Done by:  Dr. Charma Burns Pneumovax: unknown Flu Shot: N/A  Review of Systems: Pulmonary:  No SOB Cardiovascular:  No chest pain, no palpitations  Objective:  BP (!) 148/63 (BP Location: Left Arm, Patient Position: Sitting, Cuff Size: Normal)   Pulse (!) 107   Temp 98.8 F (37.1 C) (Oral)   Ht 5' 2" (1.575 m)   Wt 194 lb 9.6 oz (88.3 kg)   SpO2 98%   BMI 35.59 kg/m  General:  Well developed, well nourished, in no apparent  distress Skin:  Warm, no pallor or diaphoresis Head:  Normocephalic, atraumatic Eyes:  Pupils equal and round, sclera anicteric without injection  Nose:  External nares without trauma, no discharge Throat/Pharynx:  Lips and gingiva without lesion Neck: Neck supple.  No obvious thyromegaly or masses.  No bruits Lungs:  clear to auscultation, breath sounds equal bilaterally, no wheezes, rales, or stridor Cardio:  regular rate and rhythm without murmurs, no bruits, no LE edema Abdomen:  Abdomen soft, non-tender, BS normal Musculoskeletal:  Symmetrical muscle groups noted without atrophy or deformity Neuro:  Sensation intact to pinprick on feet Psych: Age appropriate judgment and insight  Assessment:   Type 2 diabetes mellitus with hyperglycemia, with long-term current use of insulin (HCC) - Plan: Insulin Glargine (TOUJEO SOLOSTAR) 300 UNIT/ML SOPN, Hemoglobin A1c, insulin aspart (NOVOLOG) 100 UNIT/ML FlexPen  Essential hypertension - Plan: Comprehensive metabolic panel  Tachycardia - Plan: TSH, CBC  Attention deficit disorder, unspecified hyperactivity presence - Plan: methylphenidate (RITALIN LA) 20 MG 24 hr capsule   Plan:   Orders as above. Increase dose of insulin, will break into twice daily dose from 130 u daily to 68 u twice daily. Keep [place] appraised of home sugars. Add back Novolog. No bolus insulin, ssi only for now. She will find out about her PCV23. Check home BP's. Cont current regimen for now. Will likely increase Cozaar if no improvement. Pt receiving Ritalin, would like long acting.  Recheck alk phos, will obtain GGT and Alk phos iso if still elevated at next visit. F/u in 1 mo. The patient voiced understanding and agreement to the plan.  [place], DO 08/21/16 4:52 PM

## 2016-08-22 LAB — COMPREHENSIVE METABOLIC PANEL
ALBUMIN: 4 g/dL (ref 3.5–5.2)
ALK PHOS: 120 U/L — AB (ref 39–117)
ALT: 38 U/L — ABNORMAL HIGH (ref 0–35)
AST: 35 U/L (ref 0–37)
BUN: 17 mg/dL (ref 6–23)
CALCIUM: 9.6 mg/dL (ref 8.4–10.5)
CHLORIDE: 101 meq/L (ref 96–112)
CO2: 30 mEq/L (ref 19–32)
Creatinine, Ser: 0.96 mg/dL (ref 0.40–1.20)
GFR: 63.89 mL/min (ref >60.00)
Glucose, Bld: 278 mg/dL — ABNORMAL HIGH (ref 70–99)
POTASSIUM: 4.2 meq/L (ref 3.5–5.1)
Sodium: 139 mEq/L (ref 135–145)
TOTAL PROTEIN: 7.5 g/dL (ref 6.0–8.3)
Total Bilirubin: 0.3 mg/dL (ref 0.2–1.2)

## 2016-08-22 LAB — CBC
HCT: 39.9 % (ref 36.0–46.0)
HEMOGLOBIN: 13 g/dL (ref 12.0–15.0)
MCHC: 32.6 g/dL (ref 30.0–36.0)
MCV: 83.9 fl (ref 78.0–100.0)
PLATELETS: 363 10*3/uL (ref 150.0–400.0)
RBC: 4.76 Mil/uL (ref 3.87–5.11)
RDW: 14.5 % (ref 11.5–15.5)
WBC: 7.8 10*3/uL (ref 4.0–10.5)

## 2016-08-22 LAB — TSH: TSH: 0.2 u[IU]/mL — AB (ref 0.35–4.50)

## 2016-08-22 LAB — HEMOGLOBIN A1C: Hgb A1c MFr Bld: 10.6 % — ABNORMAL HIGH (ref 4.6–6.5)

## 2016-08-25 MED ORDER — INSULIN GLARGINE 300 UNIT/ML ~~LOC~~ SOPN: 68.0000 [IU] | PEN_INJECTOR | Freq: Two times a day (BID) | SUBCUTANEOUS | 3 refills | Status: DC

## 2016-08-25 MED ORDER — INSULIN ASPART 100 UNIT/ML FLEXPEN: PEN_INJECTOR | SUBCUTANEOUS | 11 refills | Status: DC

## 2016-08-25 NOTE — Telephone Encounter (Signed)
R'x's sent to the new pharmacy.//AB/CMA

## 2016-08-27 ENCOUNTER — Other Ambulatory Visit: Payer: Self-pay | Admitting: Physician Assistant

## 2016-08-28 ENCOUNTER — Telehealth: Payer: Self-pay

## 2016-08-28 NOTE — Telephone Encounter (Signed)
PA initiated via Covermymeds; KEY: [id number]. Received real-time PA approval.   Request Reference Number: [id number]. METHYLPHENID CAP 20MG  ER is approved through 08/28/2017. For further questions, call [phone].

## 2016-09-02 ENCOUNTER — Encounter: Payer: Self-pay | Admitting: Family Medicine

## 2016-09-03 MED ORDER — INSULIN LISPRO 100 UNIT/ML (KWIKPEN): PEN_INJECTOR | SUBCUTANEOUS | 2 refills | Status: DC

## 2016-09-03 NOTE — Telephone Encounter (Signed)
Called and spoke with the pt and informed her that Dr. Nani Ravens has sent in a new insulin prescription.   Informed her per Dr. Nani Ravens if she could call her insurance to see what they will cover.  Pt verbalized understanding and agreed.  Pt stated that she will call her insurance.//AB/CMA

## 2016-09-03 NOTE — Telephone Encounter (Signed)
Needs to know max daily dose, please call [facility] [phone]

## 2016-09-03 NOTE — Telephone Encounter (Signed)
Called and [facility] @ 9:58am @ [phone]) asking the pt to RTC regarding the message below.  Needing more details.//AB/CMA

## 2016-09-03 NOTE — Telephone Encounter (Signed)
Let's try Humalog instead of Novolog. Apidra would be another option. Is there any way we can find out what her insurance will cover?

## 2016-09-03 NOTE — Telephone Encounter (Signed)
Called and informed the pt for the message below.  Pt verbalized understanding and agreed.  Pt stated that she will call her insurance company.//AB/CMA

## 2016-09-05 ENCOUNTER — Other Ambulatory Visit: Payer: Self-pay | Admitting: Physician Assistant

## 2016-09-13 ENCOUNTER — Other Ambulatory Visit: Payer: Self-pay | Admitting: Physician Assistant

## 2016-09-13 NOTE — Telephone Encounter (Signed)
Will defer further refills of patient's medications to PCP  

## 2016-09-17 ENCOUNTER — Encounter: Payer: Self-pay | Admitting: Family Medicine

## 2016-09-18 ENCOUNTER — Ambulatory Visit: Payer: PRIVATE HEALTH INSURANCE | Admitting: Family Medicine

## 2016-09-28 ENCOUNTER — Other Ambulatory Visit: Payer: Self-pay | Admitting: Physician Assistant

## 2016-10-06 ENCOUNTER — Other Ambulatory Visit: Payer: Self-pay | Admitting: Physician Assistant

## 2016-10-07 ENCOUNTER — Telehealth: Payer: Self-pay

## 2016-10-07 ENCOUNTER — Encounter: Payer: Self-pay | Admitting: Family Medicine

## 2016-10-07 DIAGNOSIS — F988 Other specified behavioral and emotional disorders with onset usually occurring in childhood and adolescence: Secondary | ICD-10-CM

## 2016-10-07 NOTE — Telephone Encounter (Signed)
Requesting: Methylphenidate Contract: None UDS: None Last OV: 8.2.18 Next OV: 9.27.18 Last Refill: 8.02.18   Please advise

## 2016-10-08 MED ORDER — METHYLPHENIDATE HCL ER (LA) 20 MG PO CP24: 20.0000 mg | ORAL_CAPSULE | ORAL | 0 refills | Status: DC

## 2016-10-08 MED ORDER — METHYLPHENIDATE HCL ER (LA) 20 MG PO CP24
20.0000 mg | ORAL_CAPSULE | ORAL | 0 refills | Status: DC
Start: 2016-10-08 — End: 2016-12-25

## 2016-10-08 NOTE — Telephone Encounter (Signed)
OK to reorder with 2 refills.

## 2016-10-08 NOTE — Telephone Encounter (Signed)
Printed/PCP signed/called the patient left a detailed message hardcopy's are ready for pickup at the front desk.

## 2016-10-16 ENCOUNTER — Encounter: Payer: Self-pay | Admitting: Family Medicine

## 2016-10-16 ENCOUNTER — Ambulatory Visit (INDEPENDENT_AMBULATORY_CARE_PROVIDER_SITE_OTHER): Payer: PRIVATE HEALTH INSURANCE | Admitting: Family Medicine

## 2016-10-16 VITALS — BP 134/72 | HR 96 | Temp 98.1°F | Ht 62.0 in | Wt 198.1 lb

## 2016-10-16 DIAGNOSIS — Z794 Long term (current) use of insulin: Secondary | ICD-10-CM

## 2016-10-16 DIAGNOSIS — E1165 Type 2 diabetes mellitus with hyperglycemia: Secondary | ICD-10-CM

## 2016-10-16 DIAGNOSIS — R748 Abnormal levels of other serum enzymes: Secondary | ICD-10-CM

## 2016-10-16 DIAGNOSIS — R7989 Other specified abnormal findings of blood chemistry: Secondary | ICD-10-CM

## 2016-10-16 DIAGNOSIS — R946 Abnormal results of thyroid function studies: Secondary | ICD-10-CM | POA: Diagnosis not present

## 2016-10-16 MED ORDER — ATORVASTATIN CALCIUM 20 MG PO TABS: 20.0000 mg | ORAL_TABLET | Freq: Every day | ORAL | 2 refills | Status: DC

## 2016-10-16 NOTE — Progress Notes (Signed)
Pre visit review using our clinic review tool, if applicable. No additional management support is needed unless otherwise documented below in the visit note. 

## 2016-10-16 NOTE — Progress Notes (Signed)
Subjective:   Chief Complaint  Patient presents with  . Follow-up    diabetes    Cristina Burns is a 56 y.o. female here for follow-up of diabetes.   Cristina Burns's self monitored glucose range is high hundred's to low 200's both fasting and post prandial. Patient had 1 hypoglycemic reaction where she was symptomatic. She checks her glucose levels 4 times per day. Patient does require insulin.   Medications include: Insulin 1 u per 12 above 140. Metformin xr 2000 mg daily, Amaryl 4 mg daily.  Patient exercises 2-3 days per week on average. Walking She does take an aspirin daily. Statin? Yes- 10 mg Lipitor ACEi/ARB? Yes  Past Medical History:  Diagnosis Date  . ADHD (attention deficit hyperactivity disorder)   . Allergic rhinitis   . Allergy   . Diabetes mellitus without complication (Cristina Burns)   . Diabetic neuropathy (Cristina Burns)   . GERD (gastroesophageal reflux disease)   . Hypertension   . Hypothyroidism   . RLS (restless legs syndrome)   . Stomach ulcer "it's been a long time"    Past Surgical History:  Procedure Laterality Date  . ABDOMINAL HYSTERECTOMY  2002  . ACHILLES TENDON REPAIR  2009  . CHOLECYSTECTOMY  1982  . ROTATOR CUFF REPAIR Right 2011    Social History   Social History  . Marital status: Single   Social History Main Topics  . Smoking status: Former Smoker    Packs/day: 2.00    Types: Cigarettes    Start date: 06/20/1973    Quit date: 06/21/1982  . Smokeless tobacco: Never Used  . Alcohol use No  . Drug use: No   Current Outpatient Prescriptions on File Prior to Visit  Medication Sig Dispense Refill  . amLODipine (NORVASC) 5 MG tablet TAKE 1 TABLET BY MOUTH ONCE DAILY 30 tablet 5  . atorvastatin (LIPITOR) 10 MG tablet Take 1 tablet (10 mg total) by mouth daily. 30 tablet 1  . cetirizine (ZYRTEC) 10 MG tablet Take 10 mg by mouth daily.    . cholecalciferol (VITAMIN D) 1000 units tablet Take 1 tablet (1,000 Units total) by mouth daily. 30 tablet 0  . FREESTYLE  LITE test strip USE TO TEST BLOOD GLUCOSE FOUR TIMES DAILY DX: E11.42 200 each 3  . gabapentin (NEURONTIN) 600 MG tablet Take 1 tablet (600 mg total) by mouth daily. 30 tablet 0  . glimepiride (AMARYL) 4 MG tablet TAKE 1 TABLET BY MOUTH TWICE DAILY BEFORE MEAL(S) 60 tablet 5  . Insulin Glargine (TOUJEO SOLOSTAR) 300 UNIT/ML SOPN Inject 68 Units into the skin 2 (two) times daily. 3 mL 3  . insulin lispro (HUMALOG) 100 UNIT/ML KiwkPen Use sliding scale for meals. 15 mL 2  . levothyroxine (SYNTHROID, LEVOTHROID) 150 MCG tablet TAKE ONE TABLET BY MOUTH ONCE DAILY BEFORE BREAKFAST 90 tablet 0  . losartan (COZAAR) 25 MG tablet TAKE 1/2 (ONE-HALF) TABLET BY MOUTH ONCE DAILY 45 tablet 0  . metFORMIN (GLUCOPHAGE-XR) 500 MG 24 hr tablet TAKE 4 TABLETS BY MOUTH ONCE DAILY 360 tablet 0  . methylphenidate (RITALIN LA) 20 MG 24 hr capsule Take 1 capsule (20 mg total) by mouth every morning. 30 capsule 0  . multivitamin-iron-minerals-folic acid (CENTRUM) chewable tablet Chew 1 tablet by mouth daily.    . pantoprazole (PROTONIX) 40 MG tablet TAKE 1 TABLET BY MOUTH TWICE DAILY 60 tablet 0  . rOPINIRole (REQUIP) 0.5 MG tablet TAKE ONE TABLET BY MOUTH IN THE MORNING, ONE TABLET IN THE AFTERNOON, AND TWO TABLETS  ONCE DAILY AT BEDTIME 120 tablet 5  . triamcinolone (NASACORT ALLERGY 24HR) 55 MCG/ACT AERO nasal inhaler Place 1 spray into the nose daily.    Marland Kitchen triamterene-hydrochlorothiazide (MAXZIDE-25) 37.5-25 MG tablet TAKE 1 TABLET BY MOUTH ONCE DAILY **PATIENT NEEDS APPOINTMENT FOR MORE REFILLS* 90 tablet 0    Review of Systems: Pulmonary:  No SOB Cardiovascular:  No chest pain  Objective:  BP 134/72 (BP Location: Left Arm, Patient Position: Sitting, Cuff Size: Normal)   Pulse 96   Temp 98.1 F (36.7 C) (Oral)   Ht 5\' 2"  (1.575 m)   Wt 198 lb 2 oz (89.9 kg)   SpO2 97%   BMI 36.24 kg/m  General:  Well developed, well nourished, in no apparent distress Skin:  Warm, no pallor or diaphoresis Neck: Neck  supple.  No obvious thyromegaly or masses.  No bruits Lungs:  clear to auscultation, breath sounds equal bilaterally, no wheezes, rales, or stridor Cardio:  regular rate and rhythm without murmurs, no bruits, 2+ b/l LE edema Psych: Age appropriate judgment and insight  Assessment:   Type 2 diabetes mellitus with hyperglycemia, with long-term current use of insulin ([place]) - Plan: metFORMIN (GLUCOPHAGE-XR) 500 MG 24 hr tablet  Low TSH level - Plan: TSH, T4, free  Elevated alkaline phosphatase level - Plan: Hepatic function panel, Alkaline Phosphatase Isoenzymes, Gamma GT   Plan:   Orders as above. 1000 mg BID from previous 2000 mg daily. Increase sliding scale to 1 u/10 pts over 140. Stay on same orals and Toujeo. Counseled on diet and exercise. Challenged her to lift weights. Increase water intake.  Increase dose of Lipitor. Will discuss increasing to 40 mg daily if tolerating well. F/u in 6 weeks to recheck DM, get a1c.  The patient voiced understanding and agreement to the plan.  [place], DO 10/16/16 4:21 PM

## 2016-10-16 NOTE — Patient Instructions (Addendum)
Take 1 units for every 10 points of sugar you are above 140 instead of 12 now. Stay on the same dose of Toujeo.  Clean up the diet. Start lifting weights.   Let us know if you need anything.

## 2016-10-17 LAB — GAMMA GT: GGT: 46 U/L (ref 7–51)

## 2016-10-17 LAB — HEPATIC FUNCTION PANEL
ALT: 37 U/L — ABNORMAL HIGH (ref 0–35)
AST: 36 U/L (ref 0–37)
Albumin: 3.9 g/dL (ref 3.5–5.2)
Alkaline Phosphatase: 105 U/L (ref 39–117)
BILIRUBIN DIRECT: 0 mg/dL (ref 0.0–0.3)
BILIRUBIN TOTAL: 0.4 mg/dL (ref 0.2–1.2)
Total Protein: 7.2 g/dL (ref 6.0–8.3)

## 2016-10-17 LAB — TSH: TSH: 0.65 u[IU]/mL (ref 0.35–4.50)

## 2016-10-17 LAB — T4, FREE: Free T4: 1.3 ng/dL (ref 0.60–1.60)

## 2016-10-21 LAB — ALKALINE PHOSPHATASE ISOENZYMES
Alkaline phosphatase (APISO): 115 U/L (ref 33–130)
BONE ISOENZYMES (ALP ISO): 35 % (ref 28–66)
INTESTINAL ISOENZYMES (ALP ISO): 2 % (ref 1–24)
LIVER ISOENZYMES (ALP ISO): 63 % (ref 25–69)

## 2016-10-28 ENCOUNTER — Other Ambulatory Visit: Payer: Self-pay | Admitting: Family Medicine

## 2016-11-12 ENCOUNTER — Other Ambulatory Visit: Payer: Self-pay | Admitting: Physician Assistant

## 2016-11-12 NOTE — Telephone Encounter (Signed)
Cristina Burns (pharmacy) called in. They sent refill request to incorrect provider. Cristina Burns states that the pt is completely out of her medication and is upset that they are just now sending request to PCP. Pharmacy would like to know if provider could please refill as soon as possible as pt is out of medication.

## 2016-11-12 NOTE — Telephone Encounter (Signed)
Refill medication and called the patient left a detailed message prescription sent in.

## 2016-11-27 ENCOUNTER — Other Ambulatory Visit: Payer: Self-pay | Admitting: Family Medicine

## 2016-11-27 ENCOUNTER — Encounter: Payer: Self-pay | Admitting: Family Medicine

## 2016-11-27 ENCOUNTER — Ambulatory Visit (INDEPENDENT_AMBULATORY_CARE_PROVIDER_SITE_OTHER): Payer: PRIVATE HEALTH INSURANCE | Admitting: Family Medicine

## 2016-11-27 VITALS — BP 128/72 | HR 98 | Temp 98.0°F | Ht 62.0 in | Wt 200.1 lb

## 2016-11-27 DIAGNOSIS — E1165 Type 2 diabetes mellitus with hyperglycemia: Secondary | ICD-10-CM

## 2016-11-27 DIAGNOSIS — Z794 Long term (current) use of insulin: Secondary | ICD-10-CM | POA: Diagnosis not present

## 2016-11-27 DIAGNOSIS — F988 Other specified behavioral and emotional disorders with onset usually occurring in childhood and adolescence: Secondary | ICD-10-CM

## 2016-11-27 MED ORDER — METHYLPHENIDATE HCL 10 MG PO TABS: 10.0000 mg | ORAL_TABLET | Freq: Every day | ORAL | 0 refills | Status: DC

## 2016-11-27 MED ORDER — INSULIN LISPRO 100 UNIT/ML (KWIKPEN): PEN_INJECTOR | SUBCUTANEOUS | 2 refills | Status: DC

## 2016-11-27 NOTE — Progress Notes (Signed)
Subjective:   Chief Complaint  Patient presents with  . Follow-up    insulin    Cristina Burns is a 56 y.o. female here for follow-up of diabetes.   Wednesday's self monitored glucose range is 200-300's.  Patient denies hypoglycemic reactions. She checks her glucose levels 3 times per day. Patient does require insulin.   Medications include: Metformin 1000 mg BID, Amaryl 4 mg BID Patient has been walking and lifting weights a little.   She does take an aspirin daily. Statin? Yes ACEi/ARB? Yes  Patient's Ritalin is not working as long.  This is not an everyday thing, however she does feel it is tapering off in the afternoon.  She is hoping for a dose change or adding something else.  She is tolerating the medicine well otherwise and is compliant.  Past Medical History:  Diagnosis Date  . ADHD (attention deficit hyperactivity disorder)   . Allergic rhinitis   . Allergy   . Diabetes mellitus without complication ([place])   . Diabetic neuropathy ([place])   . GERD (gastroesophageal reflux disease)   . Hypertension   . Hypothyroidism   . RLS (restless legs syndrome)   . Stomach ulcer "it's been a long time"    Past Surgical History:  Procedure Laterality Date  . ABDOMINAL HYSTERECTOMY  2002  . ACHILLES TENDON REPAIR  2009  . CHOLECYSTECTOMY  1982  . ROTATOR CUFF REPAIR Right 2011    Social History   Socioeconomic History  . Marital status: Single  Tobacco Use  . Smoking status: Former Smoker    Packs/day: 2.00    Types: Cigarettes    Start date: 06/20/1973    Last attempt to quit: 06/21/1982    Years since quitting: 34.4  . Smokeless tobacco: Never Used  Substance and Sexual Activity  . Alcohol use: No    Alcohol/week: 0.0 oz  . Drug use: No   Current Outpatient Medications on File Prior to Visit  Medication Sig Dispense Refill  . amLODipine (NORVASC) 5 MG tablet TAKE 1 TABLET BY MOUTH ONCE DAILY 30 tablet 5  . atorvastatin (LIPITOR) 20 MG tablet Take 1 tablet (20 mg  total) by mouth daily. 30 tablet 2  . cetirizine (ZYRTEC) 10 MG tablet Take 10 mg by mouth daily.    . cholecalciferol (VITAMIN D) 1000 units tablet Take 1 tablet (1,000 Units total) by mouth daily. 30 tablet 0  . FREESTYLE LITE test strip USE TO TEST BLOOD GLUCOSE FOUR TIMES DAILY DX: E11.42 200 each 3  . gabapentin (NEURONTIN) 600 MG tablet Take 1 tablet (600 mg total) by mouth daily. 30 tablet 0  . glimepiride (AMARYL) 4 MG tablet TAKE 1 TABLET BY MOUTH TWICE DAILY BEFORE MEAL(S) 60 tablet 5  . Insulin Glargine (TOUJEO SOLOSTAR) 300 UNIT/ML SOPN Inject 68 Units into the skin 2 (two) times daily. 3 mL 3  . levothyroxine (SYNTHROID, LEVOTHROID) 150 MCG tablet TAKE 1 TABLET BY MOUTH ONCE DAILY BEFORE BREAKFAST 90 tablet 0  . losartan (COZAAR) 25 MG tablet TAKE 1/2 (ONE-HALF) TABLET BY MOUTH ONCE DAILY 45 tablet 0  . metFORMIN (GLUCOPHAGE-XR) 500 MG 24 hr tablet Take 2 tablets (1,000 mg total) by mouth 2 (two) times daily. 360 tablet 0  . methylphenidate (RITALIN LA) 20 MG 24 hr capsule Take 1 capsule (20 mg total) by mouth every morning. 30 capsule 0  . multivitamin-iron-minerals-folic acid (CENTRUM) chewable tablet Chew 1 tablet by mouth daily.    . pantoprazole (PROTONIX) 40 MG tablet TAKE  1 TABLET BY MOUTH TWICE DAILY 60 tablet 0  . rOPINIRole (REQUIP) 0.5 MG tablet TAKE ONE TABLET BY MOUTH IN THE MORNING, ONE TABLET IN THE AFTERNOON, AND TWO TABLETS ONCE DAILY AT BEDTIME 120 tablet 5  . triamcinolone (NASACORT ALLERGY 24HR) 55 MCG/ACT AERO nasal inhaler Place 1 spray into the nose daily.    Marland Kitchen triamterene-hydrochlorothiazide (MAXZIDE-25) 37.5-25 MG tablet TAKE 1 TABLET BY MOUTH ONCE DAILY **PATIENT NEEDS APPOINTMENT FOR MORE REFILLS* 90 tablet 0   Related testing: Date of retinal exam: scheduled Flu Shot: done  Review of Systems: Eye:  No recent significant change in vision Pulmonary:  No SOB Cardiovascular:  No chest pain, no palpitations Skin/Integumentary ROS:  No abnormal skin lesions  reported Neurologic:  No numbness, tingling  Objective:  BP 128/72 (BP Location: Left Arm, Patient Position: Sitting, Cuff Size: Large)   Pulse 98   Temp 98 F (36.7 C) (Oral)   Ht 5\' 2"  (1.575 m)   Wt 200 lb 2 oz (90.8 kg)   SpO2 95%   BMI 36.60 kg/m  General:  Well developed, well nourished, in no apparent distress Skin:  Warm, no pallor or diaphoresis Head:  Normocephalic, atraumatic Eyes:  Pupils equal and round, sclera anicteric without injection  Nose:  External nares without trauma, no discharge Throat/Pharynx:  Lips and gingiva without lesion Neck: Neck supple.  No obvious thyromegaly or masses.  No bruits Lungs:  clear to auscultation, breath sounds equal bilaterally, no wheezes, rales, or stridor Cardio:  regular rate and rhythm without murmurs, no bruits, no LE edema Abdomen:  Abdomen soft, non-tender, BS normal Musculoskeletal:  Symmetrical muscle groups noted without atrophy or deformity Neuro:  Sensation intact to pinprick on feet Psych: Age appropriate judgment and insight  Assessment:   Type 2 diabetes mellitus with hyperglycemia, with long-term current use of insulin (HCC) - Plan: Hemoglobin A1c  Attention deficit disorder, unspecified hyperactivity presence   Plan:   Orders as above. Continue 60 units of Toujeo twice daily.  Continue metformin 1000 mg twice daily and Amaryl 4 mg twice daily.  We will add mealtime insulin with breakfast.  We will have a standing 5 unit dose of short acting plus sliding scale with 1 unit per every 10 g of carbs.  She was counseled on diet and exercise.  We will check an A1c today.  Follow-up in 4-6 weeks. Add short acting afternoon methylphenidate. The patient voiced understanding and agreement to the plan.  [place], DO 11/27/16 4:55 PM

## 2016-11-27 NOTE — Patient Instructions (Signed)
Keep the same sliding scale. Add 5 units with every meal plus this sliding scale.

## 2016-11-27 NOTE — Progress Notes (Signed)
Pre visit review using our clinic review tool, if applicable. No additional management support is needed unless otherwise documented below in the visit note. 

## 2016-11-28 LAB — HEMOGLOBIN A1C: Hgb A1c MFr Bld: 9.3 % — ABNORMAL HIGH (ref 4.6–6.5)

## 2016-12-02 ENCOUNTER — Other Ambulatory Visit: Payer: Self-pay | Admitting: Family Medicine

## 2016-12-02 ENCOUNTER — Encounter: Payer: Self-pay | Admitting: Family Medicine

## 2016-12-02 MED ORDER — PANTOPRAZOLE SODIUM 40 MG PO TBEC: 40.0000 mg | DELAYED_RELEASE_TABLET | Freq: Two times a day (BID) | ORAL | 1 refills | Status: DC

## 2016-12-02 MED ORDER — TRIAMTERENE-HCTZ 37.5-25 MG PO TABS: 1.0000 | ORAL_TABLET | Freq: Every day | ORAL | 1 refills | Status: DC

## 2016-12-03 ENCOUNTER — Telehealth: Payer: Self-pay

## 2016-12-03 NOTE — Telephone Encounter (Signed)
PA initiated via Covermymeds; KEY: [id number]. Awaiting determination.

## 2016-12-08 NOTE — Telephone Encounter (Signed)
PA approved through 12/08/2017. Case number: [id number]

## 2016-12-17 ENCOUNTER — Other Ambulatory Visit: Payer: Self-pay | Admitting: Family Medicine

## 2016-12-17 ENCOUNTER — Other Ambulatory Visit: Payer: Self-pay | Admitting: Physician Assistant

## 2016-12-17 NOTE — Telephone Encounter (Signed)
Will defer further refills of patient's medications to PCP  

## 2016-12-25 ENCOUNTER — Encounter: Payer: Self-pay | Admitting: Family Medicine

## 2016-12-25 ENCOUNTER — Ambulatory Visit (INDEPENDENT_AMBULATORY_CARE_PROVIDER_SITE_OTHER): Payer: PRIVATE HEALTH INSURANCE | Admitting: Family Medicine

## 2016-12-25 VITALS — BP 124/68 | HR 99 | Temp 97.8°F | Ht 62.0 in | Wt 203.4 lb

## 2016-12-25 DIAGNOSIS — M7061 Trochanteric bursitis, right hip: Secondary | ICD-10-CM

## 2016-12-25 DIAGNOSIS — Z794 Long term (current) use of insulin: Secondary | ICD-10-CM

## 2016-12-25 DIAGNOSIS — F988 Other specified behavioral and emotional disorders with onset usually occurring in childhood and adolescence: Secondary | ICD-10-CM

## 2016-12-25 DIAGNOSIS — E1165 Type 2 diabetes mellitus with hyperglycemia: Secondary | ICD-10-CM | POA: Diagnosis not present

## 2016-12-25 MED ORDER — METHYLPREDNISOLONE ACETATE 40 MG/ML IJ SUSP
40.0000 mg | Freq: Once | INTRAMUSCULAR | Status: AC
  Administered 2016-12-25: 40 mg via INTRAMUSCULAR

## 2016-12-25 MED ORDER — METHYLPHENIDATE HCL ER (LA) 20 MG PO CP24: 20.0000 mg | ORAL_CAPSULE | ORAL | 0 refills | Status: DC

## 2016-12-25 MED ORDER — METHYLPHENIDATE HCL 10 MG PO TABS: 10.0000 mg | ORAL_TABLET | Freq: Every day | ORAL | 0 refills | Status: DC

## 2016-12-25 NOTE — Patient Instructions (Addendum)
Cont Diabetic regimen for now, let me know if you are starting to get more low readings.  Ice/cold pack over area for 10-15 min every 2-3 hours while awake.  OK to take Tylenol 1000 mg (2 extra strength tabs) or 975 mg (3 regular strength tabs) every 6 hours as needed.  Let us know if you need anything.

## 2016-12-25 NOTE — Progress Notes (Signed)
Chief Complaint  Patient presents with  . Follow-up    diabetes    Subjective: Patient is a 57 y.o. female here for f/u dm.  R outer hip pain for several mo. Tumeric helped for a bit then stopped helping. Worse with prolonged standing. No injury or change in activity. Denies neurologic s/s's.  DM is getting better. Breakfast and lunch she has not been having to take mealtime insulin based on sliding scale, 1 u per 8 points over 140. A1c has been improving as of last check in early Nov. Her diet is not much improved and she does not routinely exercise still.   ROS: Heart: Denies chest pain  Lungs: Denies SOB   Family History  Problem Relation Age of Onset  . Heart disease Father   . Heart attack Father   . Heart disease Brother        CABG  . Diabetes Brother   . Kidney disease Brother   . Cirrhosis Brother   . Hypertension Brother   . Heart attack Brother   . Heart disease Brother   . Diabetes Brother   . Heart attack Brother   . Diabetes Mother   . Breast cancer Mother   . Stroke Mother    Past Medical History:  Diagnosis Date  . ADHD (attention deficit hyperactivity disorder)   . Allergic rhinitis   . Allergy   . Diabetes mellitus without complication ([place])   . Diabetic neuropathy ([place])   . GERD (gastroesophageal reflux disease)   . Hypertension   . Hypothyroidism   . RLS (restless legs syndrome)   . Stomach ulcer "it's been a long time"   Allergies  Allergen Reactions  . Codeine Hives  . Darvon [Propoxyphene] Hives    "Darvocet"  . Doxycycline     Double Vision  . Erythromycin     Stomach pain  . Keflex [Cephalexin] Hives    Current Outpatient Medications:  .  amLODipine (NORVASC) 5 MG tablet, TAKE 1 TABLET BY MOUTH ONCE DAILY, Disp: 30 tablet, Rfl: 5 .  atorvastatin (LIPITOR) 20 MG tablet, Take 1 tablet (20 mg total) by mouth daily., Disp: 30 tablet, Rfl: 2 .  cetirizine (ZYRTEC) 10 MG tablet, Take 10 mg by mouth daily., Disp: , Rfl:  .   cholecalciferol (VITAMIN D) 1000 units tablet, Take 1 tablet (1,000 Units total) by mouth daily., Disp: 30 tablet, Rfl: 0 .  FREESTYLE LITE test strip, USE TO TEST BLOOD GLUCOSE FOUR TIMES DAILY DX: E11.42, Disp: 200 each, Rfl: 3 .  gabapentin (NEURONTIN) 600 MG tablet, TAKE 1 TABLET BY MOUTH THREE TIMES DAILY, Disp: 270 tablet, Rfl: 0 .  glimepiride (AMARYL) 4 MG tablet, TAKE 1 TABLET BY MOUTH TWICE DAILY BEFORE MEAL(S), Disp: 60 tablet, Rfl: 5 .  Insulin Glargine (TOUJEO SOLOSTAR) 300 UNIT/ML SOPN, Inject 68 Units into the skin 2 (two) times daily., Disp: 3 mL, Rfl: 3 .  insulin lispro (HUMALOG) 100 UNIT/ML KiwkPen, 5 units with every meal plus sliding scale 1 u per 10 g carbs., Disp: 15 mL, Rfl: 2 .  levothyroxine (SYNTHROID, LEVOTHROID) 150 MCG tablet, TAKE 1 TABLET BY MOUTH ONCE DAILY BEFORE BREAKFAST, Disp: 90 tablet, Rfl: 0 .  losartan (COZAAR) 25 MG tablet, TAKE 1/2 (ONE-HALF) TABLET BY MOUTH ONCE DAILY, Disp: 45 tablet, Rfl: 0 .  metFORMIN (GLUCOPHAGE-XR) 500 MG 24 hr tablet, Take 2 tablets (1,000 mg total) by mouth 2 (two) times daily., Disp: 360 tablet, Rfl: 0 .  metFORMIN (GLUCOPHAGE-XR) 500 MG  24 hr tablet, TAKE 4 TABLETS BY MOUTH ONCE DAILY, Disp: 360 tablet, Rfl: 0 .  methylphenidate (RITALIN LA) 20 MG 24 hr capsule, Take 1 capsule (20 mg total) by mouth every morning., Disp: 30 capsule, Rfl: 0 .  methylphenidate (RITALIN) 10 MG tablet, Take 1 tablet (10 mg total) by mouth daily with lunch., Disp: 30 tablet, Rfl: 0 .  multivitamin-iron-minerals-folic acid (CENTRUM) chewable tablet, Chew 1 tablet by mouth daily., Disp: , Rfl:  .  pantoprazole (PROTONIX) 40 MG tablet, TAKE 1 TABLET BY MOUTH TWICE DAILY, Disp: 60 tablet, Rfl: 0 .  pantoprazole (PROTONIX) 40 MG tablet, Take 1 tablet (40 mg total) 2 (two) times daily by mouth., Disp: 60 tablet, Rfl: 1 .  rOPINIRole (REQUIP) 0.5 MG tablet, TAKE ONE TABLET BY MOUTH IN THE MORNING, ONE TABLET IN THE AFTERNOON, AND TWO TABLETS ONCE DAILY AT  BEDTIME, Disp: 120 tablet, Rfl: 5 .  triamcinolone (NASACORT ALLERGY 24HR) 55 MCG/ACT AERO nasal inhaler, Place 1 spray into the nose daily., Disp: , Rfl:  .  triamterene-hydrochlorothiazide (MAXZIDE-25) 37.5-25 MG tablet, Take 1 tablet daily by mouth., Disp: 30 tablet, Rfl: 1  Objective: BP 124/68 (BP Location: Left Arm, Patient Position: Sitting, Cuff Size: Large)   Pulse 99   Temp 97.8 F (36.6 C) (Oral)   Ht 5\' 2"  (1.575 m)   Wt 203 lb 6 oz (92.3 kg)   SpO2 98%   BMI 37.20 kg/m  General: Awake, appears stated age HEENT: MMM, EOMi Heart: RRR Lungs: CTAB, no rales, wheezes or rhonchi. No accessory muscle use MSK: TTP over greater troch on R, neg log roll, stinchfield, no pain with resisted abduction Psych: Age appropriate judgment and insight, normal affect and mood  Procedure note: Greater trochanteric bursa injection Verbal consent obtained. The area of interest was palpated and demarcated with an otoscope speculum. It was cleaned with an alcohol swab. Freeze spray was used. A 27 g needle was inserted at a perpendicular angle through the area of interested. The plunger was withdrawn to ensure our placement was not in a vessel. 2 mL of 1% lidocaine without epi and 40 mg of depomedrol was injected. A bandaid was placed. The patient tolerated the procedure well.  There were no complications noted.    Assessment and Plan: Type 2 diabetes mellitus with hyperglycemia, with long-term current use of insulin (HCC)  Greater trochanteric bursitis of right hip - Plan: methylPREDNISolone acetate (DEPO-MEDROL) injection 40 mg, PR DRAIN/INJECT LARGE JOINT/BURSA  Attention deficit disorder, unspecified hyperactivity presence - Plan: methylphenidate (RITALIN LA) 20 MG 24 hr capsule, DISCONTINUED: methylphenidate (RITALIN LA) 20 MG 24 hr capsule  Orders as above.  Cont current therapy for DM for now. Let us know if she starts to have low readings. Steroid injection, ice, ibuprofen,  activity as tolerated. F/u before the new year for a CPE for insurance. The patient voiced understanding and agreement to the plan.  [place], DO 12/25/16  6:10 PM

## 2016-12-25 NOTE — Progress Notes (Signed)
Pre visit review using our clinic review tool, if applicable. No additional management support is needed unless otherwise documented below in the visit note. 

## 2017-01-01 ENCOUNTER — Encounter: Payer: Self-pay | Admitting: Family Medicine

## 2017-01-01 ENCOUNTER — Other Ambulatory Visit: Payer: Self-pay | Admitting: Family Medicine

## 2017-01-01 DIAGNOSIS — E1165 Type 2 diabetes mellitus with hyperglycemia: Secondary | ICD-10-CM

## 2017-01-01 DIAGNOSIS — Z794 Long term (current) use of insulin: Secondary | ICD-10-CM

## 2017-01-02 MED ORDER — METFORMIN HCL ER 500 MG PO TB24: 1000.0000 mg | ORAL_TABLET | Freq: Two times a day (BID) | ORAL | 0 refills | Status: DC

## 2017-01-02 MED ORDER — INSULIN GLARGINE 300 UNIT/ML ~~LOC~~ SOPN: 68.0000 [IU] | PEN_INJECTOR | Freq: Two times a day (BID) | SUBCUTANEOUS | 3 refills | Status: DC

## 2017-01-02 MED ORDER — PANTOPRAZOLE SODIUM 40 MG PO TBEC: 40.0000 mg | DELAYED_RELEASE_TABLET | Freq: Two times a day (BID) | ORAL | 0 refills | Status: DC

## 2017-01-02 MED ORDER — LOSARTAN POTASSIUM 25 MG PO TABS: 25.0000 mg | ORAL_TABLET | Freq: Every day | ORAL | 0 refills | Status: DC

## 2017-01-02 MED ORDER — LEVOTHYROXINE SODIUM 150 MCG PO TABS: 150.0000 ug | ORAL_TABLET | Freq: Every day | ORAL | 0 refills | Status: DC

## 2017-01-08 ENCOUNTER — Emergency Department (HOSPITAL_BASED_OUTPATIENT_CLINIC_OR_DEPARTMENT_OTHER): Payer: PRIVATE HEALTH INSURANCE

## 2017-01-08 ENCOUNTER — Emergency Department (HOSPITAL_BASED_OUTPATIENT_CLINIC_OR_DEPARTMENT_OTHER)
Admission: EM | Admit: 2017-01-08 | Discharge: 2017-01-08 | Disposition: A | Discharge status: Home / Self Care | Payer: PRIVATE HEALTH INSURANCE | Attending: Emergency Medicine | Admitting: Emergency Medicine

## 2017-01-08 ENCOUNTER — Other Ambulatory Visit: Payer: Self-pay

## 2017-01-08 ENCOUNTER — Encounter (HOSPITAL_BASED_OUTPATIENT_CLINIC_OR_DEPARTMENT_OTHER): Payer: Self-pay | Admitting: Emergency Medicine

## 2017-01-08 DIAGNOSIS — E039 Hypothyroidism, unspecified: Secondary | ICD-10-CM | POA: Insufficient documentation

## 2017-01-08 DIAGNOSIS — Z794 Long term (current) use of insulin: Secondary | ICD-10-CM | POA: Insufficient documentation

## 2017-01-08 DIAGNOSIS — R109 Unspecified abdominal pain: Secondary | ICD-10-CM | POA: Insufficient documentation

## 2017-01-08 DIAGNOSIS — I10 Essential (primary) hypertension: Secondary | ICD-10-CM | POA: Diagnosis not present

## 2017-01-08 DIAGNOSIS — Z87891 Personal history of nicotine dependence: Secondary | ICD-10-CM | POA: Insufficient documentation

## 2017-01-08 DIAGNOSIS — E1140 Type 2 diabetes mellitus with diabetic neuropathy, unspecified: Secondary | ICD-10-CM | POA: Insufficient documentation

## 2017-01-08 DIAGNOSIS — M545 Low back pain: Secondary | ICD-10-CM | POA: Diagnosis present

## 2017-01-08 LAB — URINALYSIS, ROUTINE W REFLEX MICROSCOPIC
BILIRUBIN URINE: NEGATIVE
Glucose, UA: NEGATIVE mg/dL
Hgb urine dipstick: NEGATIVE
Ketones, ur: NEGATIVE mg/dL
Leukocytes, UA: NEGATIVE
NITRITE: NEGATIVE
PH: 6 (ref 5.0–8.0)
Protein, ur: NEGATIVE mg/dL
SPECIFIC GRAVITY, URINE: 1.025 (ref 1.005–1.030)

## 2017-01-08 LAB — COMPREHENSIVE METABOLIC PANEL
ALK PHOS: 140 U/L — AB (ref 38–126)
ALT: 35 U/L (ref 14–54)
ANION GAP: 6 (ref 5–15)
AST: 34 U/L (ref 15–41)
Albumin: 4 g/dL (ref 3.5–5.0)
BILIRUBIN TOTAL: 0.5 mg/dL (ref 0.3–1.2)
BUN: 20 mg/dL (ref 6–20)
CALCIUM: 9.3 mg/dL (ref 8.9–10.3)
CO2: 27 mmol/L (ref 22–32)
CREATININE: 0.77 mg/dL (ref 0.44–1.00)
Chloride: 105 mmol/L (ref 101–111)
Glucose, Bld: 106 mg/dL — ABNORMAL HIGH (ref 65–99)
Potassium: 3.9 mmol/L (ref 3.5–5.1)
SODIUM: 138 mmol/L (ref 135–145)
TOTAL PROTEIN: 8.3 g/dL — AB (ref 6.5–8.1)

## 2017-01-08 LAB — CBC WITH DIFFERENTIAL/PLATELET
Basophils Absolute: 0 10*3/uL (ref 0.0–0.1)
Basophils Relative: 0 %
EOS ABS: 0.2 10*3/uL (ref 0.0–0.7)
Eosinophils Relative: 2 %
HEMATOCRIT: 38.9 % (ref 36.0–46.0)
HEMOGLOBIN: 12.8 g/dL (ref 12.0–15.0)
LYMPHS ABS: 3.1 10*3/uL (ref 0.7–4.0)
LYMPHS PCT: 30 %
MCH: 27.2 pg (ref 26.0–34.0)
MCHC: 32.9 g/dL (ref 30.0–36.0)
MCV: 82.6 fL (ref 78.0–100.0)
MONOS PCT: 8 %
Monocytes Absolute: 0.8 10*3/uL (ref 0.1–1.0)
NEUTROS ABS: 6.3 10*3/uL (ref 1.7–7.7)
NEUTROS PCT: 60 %
Platelets: 366 10*3/uL (ref 150–400)
RBC: 4.71 MIL/uL (ref 3.87–5.11)
RDW: 14.6 % (ref 11.5–15.5)
WBC: 10.5 10*3/uL (ref 4.0–10.5)

## 2017-01-08 LAB — LIPASE, BLOOD: Lipase: 26 U/L (ref 11–51)

## 2017-01-08 MED ORDER — CYCLOBENZAPRINE HCL 10 MG PO TABS: 10.0000 mg | ORAL_TABLET | Freq: Three times a day (TID) | ORAL | 0 refills | Status: DC | PRN

## 2017-01-08 MED ORDER — HYDROCODONE-ACETAMINOPHEN 5-325 MG PO TABS
1.0000 | ORAL_TABLET | Freq: Once | ORAL | Status: DC
  Filled 2017-01-08: qty 1

## 2017-01-08 MED ORDER — OXYCODONE HCL 5 MG PO TABS
5.0000 mg | ORAL_TABLET | Freq: Once | ORAL | Status: AC
  Administered 2017-01-08: 5 mg via ORAL
  Filled 2017-01-08: qty 1

## 2017-01-08 NOTE — ED Triage Notes (Signed)
Pt reports intermittent sharp right mid back pain, was seen by PCP yet no relief . Pt denies shortness of breath . denies recent injury . Hx Bursitis in  right hip .

## 2017-01-08 NOTE — ED Provider Notes (Signed)
Patient seen and evaluated. She complains of some left back pain. She states it is reproducible movement I cannot reproduce to palpation. She has normal neurological exam. She has acellular urine. She has no mass or abnormal is on CT. Plan anti-inflammatories, muscle relaxants, primary care follow-up.   Tanna Furry, MD 01/08/17 2017

## 2017-01-08 NOTE — ED Provider Notes (Signed)
[facility] EMERGENCY DEPARTMENT Provider Note   CSN: [id number] Arrival date & time: 01/08/17  1727   History   Chief Complaint Chief Complaint  Patient presents with  . Back Pain    right mid   HPI Martin TABITHA RIGGINS is a 56 y.o. female.  Patient is a 56 year old female presenting with acute on chronic left lower back pain.  Reports that she has had this pain for about 3 months which is been acutely worsened over the past 24 hours.  Pain is intermittent and states that she feels like it is under her ribs.  She denies any fevers, chills, blood in her urine.  She has no history of kidney stones.  And her most recent UTI was one year ago and currently denies any dysuria. She has no history of pyelonephritis.  Decided to come to the emergency department because her brother has history of what sounds like renal cell carcinoma and is status post total nephrectomy and want to make sure that she does not have the same thing.       Past Medical History:  Diagnosis Date  . ADHD (attention deficit hyperactivity disorder)   . Allergic rhinitis   . Allergy   . Diabetes mellitus without complication ([place])   . Diabetic neuropathy ([place])   . GERD (gastroesophageal reflux disease)   . Hypertension   . Hypothyroidism   . RLS (restless legs syndrome)   . Stomach ulcer "it's been a long time"    Patient Active Problem List   Diagnosis Date Noted  . Type 2 diabetes mellitus with hyperglycemia ([place]) 08/21/2016  . Acute bacterial sinusitis 05/29/2016  . Attention deficit disorder 05/29/2016  . Essential hypertension 03/18/2015  . Hypothyroidism 03/18/2015    Past Surgical History:  Procedure Laterality Date  . ABDOMINAL HYSTERECTOMY  2002  . ACHILLES TENDON REPAIR  2009  . CHOLECYSTECTOMY  1982  . ROTATOR CUFF REPAIR Right 2011    OB History    No data available       Home Medications    Prior to Admission medications   Medication Sig Start Date End Date Taking?  Authorizing Provider  amLODipine (NORVASC) 5 MG tablet TAKE 1 TABLET BY MOUTH ONCE DAILY 10/06/16   Shelda Pal, DO  atorvastatin (LIPITOR) 20 MG tablet Take 1 tablet (20 mg total) by mouth daily. 10/16/16   Shelda Pal, DO  cetirizine (ZYRTEC) 10 MG tablet Take 10 mg by mouth daily.    [provider]  cholecalciferol (VITAMIN D) 1000 units tablet Take 1 tablet (1,000 Units total) by mouth daily. 05/28/16   Brunetta Jeans, PA-C  cyclobenzaprine (FLEXERIL) 10 MG tablet Take 1 tablet (10 mg total) by mouth 3 (three) times daily as needed for muscle spasms. 01/08/17   Eloise Levels, MD  FREESTYLE LITE test strip USE TO TEST BLOOD GLUCOSE FOUR TIMES DAILY DX: E11.42 10/18/15   Brunetta Jeans, PA-C  gabapentin (NEURONTIN) 600 MG tablet TAKE 1 TABLET BY MOUTH THREE TIMES DAILY 12/17/16   Nani Ravens, Crosby Oyster, DO  glimepiride (AMARYL) 4 MG tablet TAKE 1 TABLET BY MOUTH TWICE DAILY BEFORE MEAL(S) 10/06/16   Wendling, Crosby Oyster, DO  Insulin Glargine (TOUJEO SOLOSTAR) 300 UNIT/ML SOPN Inject 68 Units into the skin 2 (two) times daily. 01/02/17   Shelda Pal, DO  insulin lispro (HUMALOG) 100 UNIT/ML KiwkPen 5 units with every meal plus sliding scale 1 u per 10 g carbs. 11/27/16   Wendling,  Crosby Oyster, DO  levothyroxine (SYNTHROID, LEVOTHROID) 150 MCG tablet Take 1 tablet (150 mcg total) by mouth daily before breakfast. 01/02/17   Wendling, Crosby Oyster, DO  losartan (COZAAR) 25 MG tablet Take 1 tablet (25 mg total) by mouth daily. 01/02/17   Shelda Pal, DO  metFORMIN (GLUCOPHAGE-XR) 500 MG 24 hr tablet TAKE 4 TABLETS BY MOUTH ONCE DAILY 12/17/16   Shelda Pal, DO  metFORMIN (GLUCOPHAGE-XR) 500 MG 24 hr tablet Take 2 tablets (1,000 mg total) by mouth 2 (two) times daily. 01/02/17   Shelda Pal, DO  methylphenidate (RITALIN LA) 20 MG 24 hr capsule Take 1 capsule (20 mg total) by mouth every morning. 12/25/16   Shelda Pal, DO  methylphenidate (RITALIN) 10 MG tablet Take 1 tablet (10 mg total) by mouth daily with lunch. 12/25/16   Shelda Pal, DO  multivitamin-iron-minerals-folic acid (CENTRUM) chewable tablet Chew 1 tablet by mouth daily.    [provider]  pantoprazole (PROTONIX) 40 MG tablet Take 1 tablet (40 mg total) 2 (two) times daily by mouth. 12/02/16   Wendling, Crosby Oyster, DO  pantoprazole (PROTONIX) 40 MG tablet Take 1 tablet (40 mg total) by mouth 2 (two) times daily. 01/02/17   Shelda Pal, DO  rOPINIRole (REQUIP) 0.5 MG tablet TAKE ONE TABLET BY MOUTH IN THE MORNING, ONE TABLET IN THE AFTERNOON, AND TWO TABLETS ONCE DAILY AT BEDTIME 09/15/16   Wendling, Crosby Oyster, DO  triamcinolone (NASACORT ALLERGY 24HR) 55 MCG/ACT AERO nasal inhaler Place 1 spray into the nose daily.    [provider]  triamterene-hydrochlorothiazide [facility]) 37.5-25 MG tablet Take 1 tablet daily by mouth. 12/02/16   Wendling, Crosby Oyster, DO    Family History Family History  Problem Relation Age of Onset  . Heart disease Father   . Heart attack Father   . Heart disease Brother        CABG  . Diabetes Brother   . Kidney disease Brother   . Cirrhosis Brother   . Hypertension Brother   . Heart attack Brother   . Heart disease Brother   . Diabetes Brother   . Heart attack Brother   . Diabetes Mother   . Breast cancer Mother   . Stroke Mother     Social History Social History   Tobacco Use  . Smoking status: Former Smoker    Packs/day: 2.00    Types: Cigarettes    Start date: 06/20/1973    Last attempt to quit: 06/21/1982    Years since quitting: 34.5  . Smokeless tobacco: Never Used  Substance Use Topics  . Alcohol use: No    Alcohol/week: 0.0 oz  . Drug use: No     Allergies   Codeine; Darvon [propoxyphene]; Doxycycline; Erythromycin; and Keflex [cephalexin]   Review of Systems Review of Systems  Constitutional: Negative.   HENT:  Negative.   Eyes: Negative.   Respiratory: Negative.   Cardiovascular: Negative.   Gastrointestinal: Negative.   Genitourinary: Positive for flank pain. Negative for dysuria, frequency, hematuria, pelvic pain, urgency and vaginal bleeding.  Musculoskeletal: Negative for back pain.  Neurological: Negative for dizziness, weakness and numbness.    Physical Exam Updated Vital Signs BP (!) 152/79 (BP Location: Right Arm)   Pulse (!) 103   Temp 98 F (36.7 C) (Oral)   Resp 20   Ht 5\' 2"  (1.575 m)   Wt 90.7 kg (200 lb)   SpO2 98%   BMI 36.58 kg/m  Physical Exam  Constitutional: She is oriented to person, place, and time. She appears well-developed and well-nourished. No distress.  Uncomfortable but in no acute distress  HENT:  Head: Normocephalic and atraumatic.  Eyes: Conjunctivae and EOM are normal. Pupils are equal, round, and reactive to light.  Neck: Normal range of motion. Neck supple.  Cardiovascular: Normal rate and regular rhythm. Exam reveals no friction rub.  No murmur heard. Pulmonary/Chest: Effort normal and breath sounds normal. No respiratory distress. She has no wheezes.  Abdominal: Soft. She exhibits no distension. There is no tenderness. There is no guarding.  Musculoskeletal: Normal range of motion. She exhibits no edema, tenderness or deformity.  Neurological: She is alert and oriented to person, place, and time. No cranial nerve deficit.  Skin: Skin is warm and dry. Capillary refill takes less than 2 seconds. No rash noted. She is not diaphoretic. No erythema.  Psychiatric: She has a normal mood and affect.     ED Treatments / Results  Labs (all labs ordered are listed, but only abnormal results are displayed) Labs Reviewed  COMPREHENSIVE METABOLIC PANEL - Abnormal; Notable for the following components:      Result Value   Glucose, Bld 106 (*)    Total Protein 8.3 (*)    Alkaline Phosphatase 140 (*)    All other components within normal limits    URINALYSIS, ROUTINE W REFLEX MICROSCOPIC  CBC WITH DIFFERENTIAL/PLATELET  LIPASE, BLOOD    EKG  EKG Interpretation None       Radiology Ct Renal Stone Study  Result Date: 01/08/2017 CLINICAL DATA:  56 year old female with left flank pain for 2-3 months, increased today with nausea. EXAM: CT ABDOMEN AND PELVIS WITHOUT CONTRAST TECHNIQUE: Multidetector CT imaging of the abdomen and pelvis was performed following the standard protocol without IV contrast. COMPARISON:  05/31/2015 and prior CTs FINDINGS: Please note that parenchymal abnormalities may be missed without intravenous contrast. Lower chest: No acute abnormality. Hepatobiliary: The liver is unremarkable. The patient is status post cholecystectomy. No biliary dilatation. Pancreas: Unremarkable Spleen: Unremarkable Adrenals/Urinary Tract: The kidneys, adrenal glands and bladder are unremarkable. Stomach/Bowel: Stomach is within normal limits. Appendix appears normal. No evidence of bowel wall thickening, distention, or inflammatory changes. Vascular/Lymphatic: Aortic atherosclerosis. No enlarged abdominal or pelvic lymph nodes. Reproductive: Status post hysterectomy. No adnexal masses. Other: No ascites, focal collection or pneumoperitoneum. No abdominal wall hernia identified. Musculoskeletal: No acute or significant osseous findings. IMPRESSION: 1. No acute abnormality or findings to suggest a cause for this patient's left flank pain. 2.  Aortic Atherosclerosis (ICD10-I70.0). Electronically Signed   By: Margarette Canada M.D.   On: 01/08/2017 19:42    Procedures Procedures (including critical care time)  Medications Ordered in ED Medications  oxyCODONE (Oxy IR/ROXICODONE) immediate release tablet 5 mg (5 mg Oral Given 01/08/17 1933)     Initial Impression / Assessment and Plan / ED Course  I have reviewed the triage vital signs and the nursing notes.  Pertinent labs & imaging results that were available during my care of the patient  were reviewed by me and considered in my medical decision making (see chart for details).    Patient is a 56 year old female presenting with acute on chronic left lower back pain that she reports is intermittent and excruciating.  Patient is well-appearing on exam and no pain was elicited on exam.  While standing patient did have left shoulder drop and is possible that her poor posture could be contributing to her discomfort and potentially  muscle strain in the left thoracic paraspinal region.  She is neurologically intact and has no focal neurological deficits.  She had a CT that was normal and showed no stones.  She had a UA that showed no blood and normal lipase and CMP that showed chronically elevated alkaline phosphatase.  Final Clinical Impressions(s) / ED Diagnoses   Final diagnoses:  Left flank pain    ED Discharge Orders        Ordered    cyclobenzaprine (FLEXERIL) 10 MG tablet  3 times daily PRN,   Status:  Discontinued     01/08/17 2019    cyclobenzaprine (FLEXERIL) 10 MG tablet  3 times daily PRN     01/08/17 2020       Eloise Levels, MD 01/08/17 2028    Tanna Furry, MD 01/08/17 2321

## 2017-01-08 NOTE — Discharge Instructions (Signed)
Cristina Burns, you were seen today for ongoing left lower back pain.  You have no blood in your urine and you had a CAT scan that did not show any significant abnormality to explain your back pain.  You should follow-up with your primary doctor and if you continue to have this pain you could consider seeing a sports medicine specialist.  You were discharged with a muscle relaxer that you can take as needed.

## 2017-01-08 NOTE — ED Notes (Signed)
Patient transported to CT 

## 2017-01-12 ENCOUNTER — Other Ambulatory Visit: Payer: Self-pay | Admitting: Family Medicine

## 2017-01-12 MED ORDER — METHYLPHENIDATE HCL 10 MG PO TABS: 10.0000 mg | ORAL_TABLET | Freq: Every day | ORAL | 0 refills | Status: DC

## 2017-01-12 NOTE — Telephone Encounter (Signed)
Please advise 

## 2017-01-15 ENCOUNTER — Ambulatory Visit (INDEPENDENT_AMBULATORY_CARE_PROVIDER_SITE_OTHER): Payer: PRIVATE HEALTH INSURANCE | Admitting: Family Medicine

## 2017-01-15 ENCOUNTER — Encounter: Payer: Self-pay | Admitting: Family Medicine

## 2017-01-15 VITALS — BP 134/62 | HR 108 | Temp 98.1°F | Ht 62.0 in | Wt 200.1 lb

## 2017-01-15 DIAGNOSIS — Z0000 Encounter for general adult medical examination without abnormal findings: Secondary | ICD-10-CM | POA: Diagnosis not present

## 2017-01-15 DIAGNOSIS — Z1211 Encounter for screening for malignant neoplasm of colon: Secondary | ICD-10-CM | POA: Diagnosis not present

## 2017-01-15 DIAGNOSIS — D489 Neoplasm of uncertain behavior, unspecified: Secondary | ICD-10-CM | POA: Diagnosis not present

## 2017-01-15 MED ORDER — ATORVASTATIN CALCIUM 40 MG PO TABS: 40.0000 mg | ORAL_TABLET | Freq: Every day | ORAL | 5 refills | Status: DC

## 2017-01-15 NOTE — Patient Instructions (Signed)
Do not shower for the rest of the day. When you do wash it, use only soap and water. Do not vigorously scrub. Apply triple antibiotic ointment (like Neosporin) twice daily. Keep the area clean and dry.   Things to look out for: increasing pain not relieved by ibuprofen/acetaminophen, fevers, spreading redness, drainage of pus, or foul odor.  Let us know if you need anything.  Healthy Eating Plan Many factors influence your heart health, including eating and exercise habits. Heart (coronary) risk increases with abnormal blood fat (lipid) levels. Heart-healthy meal planning includes limiting unhealthy fats, increasing healthy fats, and making other small dietary changes. This includes maintaining a healthy body weight to help keep lipid levels within a normal range.  WHAT IS MY PLAN?  Your health care provider recommends that you:  Drink a glass of water before meals to help with satiety.  Eat slowly.  An alternative to the water is to add Metamucil. This will help with satiety as well. It does contain calories, unlike water.  WHAT TYPES OF FAT SHOULD I CHOOSE?  Choose healthy fats more often. Choose monounsaturated and polyunsaturated fats, such as olive oil and canola oil, flaxseeds, walnuts, almonds, and seeds.  Eat more omega-3 fats. Good choices include salmon, mackerel, sardines, tuna, flaxseed oil, and ground flaxseeds. Aim to eat fish at least two times each week.  Avoid foods with partially hydrogenated oils in them. These contain trans fats. Examples of foods that contain trans fats are stick margarine, some tub margarines, cookies, crackers, and other baked goods. If you are going to avoid a fat, this is the one to avoid!  WHAT GENERAL GUIDELINES DO I NEED TO FOLLOW?  Check food labels carefully to identify foods with trans fats. Avoid these types of options when possible.  Fill one half of your plate with vegetables and green salads. Eat 4-5 servings of vegetables per day. A  serving of vegetables equals 1 cup of raw leafy vegetables,  cup of raw or cooked cut-up vegetables, or  cup of vegetable juice.  Fill one fourth of your plate with whole grains. Look for the word "whole" as the first word in the ingredient list.  Fill one fourth of your plate with lean protein foods.  Eat 4-5 servings of fruit per day. A serving of fruit equals one medium whole fruit,  cup of dried fruit,  cup of fresh, frozen, or canned fruit. Try to avoid fruits in cups/syrups as the sugar content can be high.  Eat more foods that contain soluble fiber. Examples of foods that contain this type of fiber are apples, broccoli, carrots, beans, peas, and barley. Aim to get 20-30 g of fiber per day.  Eat more home-cooked food and less restaurant, buffet, and fast food.  Limit or avoid alcohol.  Limit foods that are high in starch and sugar.  Avoid fried foods when able.  Cook foods by using methods other than frying. Baking, boiling, grilling, and broiling are all great options. Other fat-reducing suggestions include: ? Removing the skin from poultry. ? Removing all visible fats from meats. ? Skimming the fat off of stews, soups, and gravies before serving them. ? Steaming vegetables in water or broth.  Lose weight if you are overweight. Losing just 5-10% of your initial body weight can help your overall health and prevent diseases such as diabetes and heart disease.  Increase your consumption of nuts, legumes, and seeds to 4-5 servings per week. One serving of dried beans or legumes  equals  cup after being cooked, one serving of nuts equals 1 ounces, and one serving of seeds equals  ounce or 1 tablespoon.  WHAT ARE GOOD FOODS CAN I EAT? Grains Grainy breads (try to find bread that is 3 g of fiber per slice or greater), oatmeal, light popcorn. Whole-grain cereals. Rice and pasta, including brown rice and those that are made with whole wheat. Edamame pasta is a great alternative to  grain pasta. It has a higher protein content. Try to avoid significant consumption of white bread, sugary cereals, or pastries/baked goods.  Vegetables All vegetables. Cooked white potatoes do not count as vegetables.  Fruits All fruits, but limit pineapple and bananas as these fruits have a higher sugar content.  Meats and Other Protein Sources Lean, well-trimmed beef, veal, pork, and lamb. Chicken and [place] without skin. All fish and shellfish. Wild duck, rabbit, pheasant, and venison. Egg whites or low-cholesterol egg substitutes. Dried beans, peas, lentils, and tofu.Seeds and most nuts.  Dairy Low-fat or nonfat cheeses, including ricotta, string, and mozzarella. Skim or 1% milk that is liquid, powdered, or evaporated. Buttermilk that is made with low-fat milk. Nonfat or low-fat yogurt. Soy/Almond milk are good alternatives if you cannot handle dairy.  Beverages Water is the best for you. Sports drinks with less sugar are more desirable unless you are a highly active athlete.  Sweets and Desserts Sherbets and fruit ices. Honey, jam, marmalade, jelly, and syrups. Dark chocolate.  Eat all sweets and desserts in moderation.  Fats and Oils Nonhydrogenated (trans-free) margarines. Vegetable oils, including soybean, sesame, sunflower, olive, peanut, safflower, corn, canola, and cottonseed. Salad dressings or mayonnaise that are made with a vegetable oil. Limit added fats and oils that you use for cooking, baking, salads, and as spreads.  Other Cocoa powder. Coffee and tea. Most condiments.  The items listed above may not be a complete list of recommended foods or beverages. Contact your dietitian for more options.

## 2017-01-15 NOTE — Progress Notes (Signed)
Pre visit review using our clinic review tool, if applicable. No additional management support is needed unless otherwise documented below in the visit note. 

## 2017-01-15 NOTE — Progress Notes (Signed)
Chief Complaint  Patient presents with  . Annual Exam     Well Woman Cristina Burns is here for a complete physical.   Her last physical was >1 year ago.  Current diet: in general, usually a "healthy" diet. Current exercise: none. Weight is stable and she denies daytime fatigue. No LMP recorded. Patient has had a hysterectomy. Seatbelt? Yes  Health Maintenance TAH, no cervix; GYN checking Mammogram- Yes; 01/08/17 Tetanus- Yes Hep C screening- Yes HIV screening- Yes- 12/20/89 Colonoscopy- due  Past Medical History:  Diagnosis Date  . ADHD (attention deficit hyperactivity disorder)   . Allergic rhinitis   . Allergy   . Diabetes mellitus without complication ([place])   . Diabetic neuropathy ([place])   . GERD (gastroesophageal reflux disease)   . Hypertension   . Hypothyroidism   . RLS (restless legs syndrome)   . Stomach ulcer "it's been a long time"     Past Surgical History:  Procedure Laterality Date  . ABDOMINAL HYSTERECTOMY  2002  . ACHILLES TENDON REPAIR  2009  . CHOLECYSTECTOMY  1982  . ROTATOR CUFF REPAIR Right 2011    Medications  Current Outpatient Medications on File Prior to Visit  Medication Sig Dispense Refill  . amLODipine (NORVASC) 5 MG tablet TAKE 1 TABLET BY MOUTH ONCE DAILY 30 tablet 5  . atorvastatin (LIPITOR) 20 MG tablet Take 1 tablet (20 mg total) by mouth daily. 30 tablet 2  . cetirizine (ZYRTEC) 10 MG tablet Take 10 mg by mouth daily.    . cholecalciferol (VITAMIN D) 1000 units tablet Take 1 tablet (1,000 Units total) by mouth daily. 30 tablet 0  . cyclobenzaprine (FLEXERIL) 10 MG tablet Take 1 tablet (10 mg total) by mouth 3 (three) times daily as needed for muscle spasms. 20 tablet 0  . FREESTYLE LITE test strip USE TO TEST BLOOD GLUCOSE FOUR TIMES DAILY DX: E11.42 200 each 3  . gabapentin (NEURONTIN) 600 MG tablet TAKE 1 TABLET BY MOUTH THREE TIMES DAILY 270 tablet 0  . glimepiride (AMARYL) 4 MG tablet TAKE 1 TABLET BY MOUTH TWICE DAILY BEFORE  MEAL(S) 60 tablet 5  . Insulin Glargine (TOUJEO SOLOSTAR) 300 UNIT/ML SOPN Inject 68 Units into the skin 2 (two) times daily. 3 mL 3  . insulin lispro (HUMALOG) 100 UNIT/ML KiwkPen 5 units with every meal plus sliding scale 1 u per 10 g carbs. 15 mL 2  . levothyroxine (SYNTHROID, LEVOTHROID) 150 MCG tablet Take 1 tablet (150 mcg total) by mouth daily before breakfast. 90 tablet 0  . losartan (COZAAR) 25 MG tablet Take 1 tablet (25 mg total) by mouth daily. 45 tablet 0  . metFORMIN (GLUCOPHAGE-XR) 500 MG 24 hr tablet TAKE 4 TABLETS BY MOUTH ONCE DAILY 360 tablet 0  . metFORMIN (GLUCOPHAGE-XR) 500 MG 24 hr tablet Take 2 tablets (1,000 mg total) by mouth 2 (two) times daily. 360 tablet 0  . methylphenidate (RITALIN LA) 20 MG 24 hr capsule Take 1 capsule (20 mg total) by mouth every morning. 30 capsule 0  . methylphenidate (RITALIN) 10 MG tablet Take 1 tablet (10 mg total) by mouth daily with lunch. 30 tablet 0  . multivitamin-iron-minerals-folic acid (CENTRUM) chewable tablet Chew 1 tablet by mouth daily.    . pantoprazole (PROTONIX) 40 MG tablet Take 1 tablet (40 mg total) 2 (two) times daily by mouth. 60 tablet 1  . pantoprazole (PROTONIX) 40 MG tablet Take 1 tablet (40 mg total) by mouth 2 (two) times daily. 60 tablet 0  .  rOPINIRole (REQUIP) 0.5 MG tablet TAKE ONE TABLET BY MOUTH IN THE MORNING, ONE TABLET IN THE AFTERNOON, AND TWO TABLETS ONCE DAILY AT BEDTIME 120 tablet 5  . triamcinolone (NASACORT ALLERGY 24HR) 55 MCG/ACT AERO nasal inhaler Place 1 spray into the nose daily.    Marland Kitchen triamterene-hydrochlorothiazide (MAXZIDE-25) 37.5-25 MG tablet Take 1 tablet daily by mouth. 30 tablet 1   Allergies Allergies  Allergen Reactions  . Codeine Hives  . Darvon [Propoxyphene] Hives    "Darvocet"  . Doxycycline     Double Vision  . Erythromycin     Stomach pain  . Keflex [Cephalexin] Hives    Review of Systems: Constitutional:  no fevers Eye:  no recent significant change in  vision Ear/Nose/Mouth/Throat:  Ears:  no tinnitus or vertigo and no recent change in hearing, Nose/Mouth/Throat:  no complaints of nasal congestion, no sore throat Cardiovascular: no chest pain, no palpitations Respiratory:  no cough and no shortness of breath Gastrointestinal:  no abdominal pain, no change in bowel habits, no significant change in appetite, no nausea, vomiting, diarrhea, or constipation and no black or bloody stool GU:  Female: negative for dysuria, frequency, and incontinence; no abnormal bleeding, pelvic pain, or discharge Musculoskeletal/Extremities:  no pain of the joints Integumentary (Skin/Breast): +skin lesion on Neck; otherwise no abnormal skin lesions reported Neurologic:  no headaches Endocrine:  denies fatigue Hematologic/Lymphatic:  no unexpected weight changes  Exam BP 134/62 (BP Location: Left Arm, Patient Position: Sitting, Cuff Size: Large)   Pulse (!) 108   Temp 98.1 F (36.7 C) (Oral)   Ht 5\' 2"  (1.575 m)   Wt 200 lb 2 oz (90.8 kg)   SpO2 98%   BMI 36.60 kg/m  General:  well developed, well nourished, in no apparent distress Skin:  no significant moles, warts, or growths Head:  no masses, lesions, or tenderness Eyes:  pupils equal and round, sclera anicteric without injection Ears:  canals without lesions, TMs shiny without retraction, no obvious effusion, no erythema Nose:  nares patent, septum midline, mucosa normal, and no drainage or sinus tenderness Throat/Pharynx:  lips and gingiva without lesion; tongue and uvula midline; non-inflamed pharynx; no exudates or postnasal drainage Neck: neck supple without adenopathy, thyromegaly, or masses Breasts:  Not done Thorax:  nontender Lungs:  clear to auscultation, breath sounds equal bilaterally, no respiratory distress Cardio:  regular rate and rhythm without murmurs, heart sounds without clicks or rubs, point of maximal impulse normal; no lifts, heaves, or thrills Abdomen:  abdomen soft, nontender;  bowel sounds normal; no masses or organomegaly Genital: Defer to GYN Musculoskeletal:  symmetrical muscle groups noted without atrophy or deformity Extremities:  no clubbing, cyanosis, or edema, no deformities, no skin discoloration Neuro:  gait normal; deep tendon reflexes normal and symmetric Psych: well oriented with normal range of affect and appropriate judgment/insight  Procedure note; lesion removal on L neck Informed consent obtained. Areas cleaned with alcohol.  Freeze spray used and then 0.5 mL 1% lido w epi used for anesthesia. Pickups used to grasp lesion and they were cut with scissors. 2 lesions from L neck removed and placed in formalin container.  Several skin tags were removed over R neck and L axillae. Hemostasis was obtained. There were no complications noted. The patient tolerated the procedure well.  Assessment and Plan  Well adult exam  Neoplasm of uncertain behavior - Plan: Dermatology pathology, PR BIOPSY OF SKIN LESION  Screen for colon cancer - Plan: Ambulatory referral to Gastroenterology   Well  56 y.o. female. Counseled on diet and exercise. Aftercare instructions given for skin procedure. Other orders as above. Follow up as originally scheduled in 2019. The patient voiced understanding and agreement to the plan.  [place], [place] 01/15/17 4:39 PM

## 2017-01-16 ENCOUNTER — Other Ambulatory Visit: Payer: Self-pay | Admitting: *Deleted

## 2017-01-16 DIAGNOSIS — Z794 Long term (current) use of insulin: Secondary | ICD-10-CM

## 2017-01-16 DIAGNOSIS — E1165 Type 2 diabetes mellitus with hyperglycemia: Secondary | ICD-10-CM

## 2017-01-16 MED ORDER — INSULIN GLARGINE 300 UNIT/ML ~~LOC~~ SOPN: 68.0000 [IU] | PEN_INJECTOR | Freq: Two times a day (BID) | SUBCUTANEOUS | 3 refills | Status: DC

## 2017-01-16 NOTE — Progress Notes (Signed)
Faxed request received from [facility] for quantity amount of Toujeo to cover 30-day period [one co-pay per month]. Sent in new Rx for [3] boxes per pharmacy request/SLS 12/28

## 2017-01-23 ENCOUNTER — Telehealth: Payer: Self-pay | Admitting: *Deleted

## 2017-01-23 NOTE — Telephone Encounter (Signed)
Received Dermatopathology Report results from [facility]; forwarded to provider/SLS 01/04

## 2017-01-27 ENCOUNTER — Encounter: Payer: Self-pay | Admitting: Family Medicine

## 2017-01-27 DIAGNOSIS — E1165 Type 2 diabetes mellitus with hyperglycemia: Secondary | ICD-10-CM

## 2017-01-27 DIAGNOSIS — Z794 Long term (current) use of insulin: Secondary | ICD-10-CM

## 2017-01-27 DIAGNOSIS — F988 Other specified behavioral and emotional disorders with onset usually occurring in childhood and adolescence: Secondary | ICD-10-CM

## 2017-01-28 MED ORDER — METHYLPHENIDATE HCL 10 MG PO TABS: 10.0000 mg | ORAL_TABLET | Freq: Every day | ORAL | 0 refills | Status: DC

## 2017-01-28 MED ORDER — METHYLPHENIDATE HCL ER (LA) 20 MG PO CP24: 20.0000 mg | ORAL_CAPSULE | ORAL | 0 refills | Status: DC

## 2017-01-28 MED ORDER — METHYLPHENIDATE HCL ER (XR) 20 MG PO CP24: 20.0000 mg | ORAL_CAPSULE | Freq: Every day | ORAL | 0 refills | Status: DC

## 2017-01-28 MED ORDER — METHYLPHENIDATE HCL 10 MG PO TABS: ORAL_TABLET | ORAL | 0 refills | Status: DC

## 2017-01-28 NOTE — Telephone Encounter (Signed)
OK to call in 3 mo supplies of insulin. Ritalin refilled. TY.

## 2017-01-29 ENCOUNTER — Other Ambulatory Visit: Payer: Self-pay | Admitting: Family Medicine

## 2017-01-29 MED ORDER — INSULIN GLARGINE 300 UNIT/ML ~~LOC~~ SOPN: 68.0000 [IU] | PEN_INJECTOR | Freq: Two times a day (BID) | SUBCUTANEOUS | 3 refills | Status: DC

## 2017-01-29 NOTE — Addendum Note (Signed)
Addended by: Sharon Seller B on: 01/29/2017 08:07 AM   Modules accepted: Orders

## 2017-01-29 NOTE — Telephone Encounter (Signed)
Called to clarify ritalin refill Update list

## 2017-02-23 ENCOUNTER — Encounter: Payer: Self-pay | Admitting: Family Medicine

## 2017-02-23 ENCOUNTER — Other Ambulatory Visit: Payer: Self-pay | Admitting: Family Medicine

## 2017-02-23 MED ORDER — METHYLPHENIDATE HCL 10 MG PO TABS: ORAL_TABLET | ORAL | 0 refills | Status: DC

## 2017-02-23 MED ORDER — METHYLPHENIDATE HCL 10 MG PO TABS: 10.0000 mg | ORAL_TABLET | Freq: Every day | ORAL | 0 refills | Status: DC

## 2017-03-10 ENCOUNTER — Other Ambulatory Visit: Payer: Self-pay | Admitting: Physician Assistant

## 2017-03-11 NOTE — Telephone Encounter (Signed)
Will defer further refills of patient's medications to PCP  

## 2017-03-19 ENCOUNTER — Other Ambulatory Visit: Payer: Self-pay | Admitting: Family Medicine

## 2017-03-19 ENCOUNTER — Encounter: Payer: Self-pay | Admitting: Family Medicine

## 2017-03-19 DIAGNOSIS — Z794 Long term (current) use of insulin: Secondary | ICD-10-CM

## 2017-03-19 DIAGNOSIS — E1165 Type 2 diabetes mellitus with hyperglycemia: Secondary | ICD-10-CM

## 2017-03-20 MED ORDER — METFORMIN HCL ER 500 MG PO TB24: 1000.0000 mg | ORAL_TABLET | Freq: Two times a day (BID) | ORAL | 0 refills | Status: DC

## 2017-03-20 NOTE — Telephone Encounter (Signed)
Pt is requesting refill on methyphenidate 20mg .

## 2017-04-03 ENCOUNTER — Other Ambulatory Visit: Payer: Self-pay | Admitting: Family Medicine

## 2017-04-04 ENCOUNTER — Other Ambulatory Visit: Payer: Self-pay | Admitting: Family Medicine

## 2017-04-09 ENCOUNTER — Encounter: Payer: Self-pay | Admitting: Family Medicine

## 2017-04-09 ENCOUNTER — Ambulatory Visit (INDEPENDENT_AMBULATORY_CARE_PROVIDER_SITE_OTHER): Payer: PRIVATE HEALTH INSURANCE | Admitting: Family Medicine

## 2017-04-09 VITALS — BP 138/72 | HR 84 | Temp 98.1°F | Ht 62.0 in | Wt 206.0 lb

## 2017-04-09 DIAGNOSIS — E1165 Type 2 diabetes mellitus with hyperglycemia: Secondary | ICD-10-CM

## 2017-04-09 DIAGNOSIS — Z794 Long term (current) use of insulin: Secondary | ICD-10-CM

## 2017-04-09 DIAGNOSIS — E039 Hypothyroidism, unspecified: Secondary | ICD-10-CM | POA: Diagnosis not present

## 2017-04-09 DIAGNOSIS — M7061 Trochanteric bursitis, right hip: Secondary | ICD-10-CM

## 2017-04-09 MED ORDER — METHYLPREDNISOLONE ACETATE 40 MG/ML IJ SUSP
40.0000 mg | Freq: Once | INTRAMUSCULAR | Status: AC
  Administered 2017-04-09: 40 mg via INTRAMUSCULAR

## 2017-04-09 NOTE — Patient Instructions (Addendum)
Continue your sliding scale of sugar - 140 and divide by 8 (1 u per 8 of glucose). This will be in addition to a baseline of 5 units.  Keep exercising.   Keep the diet clean.  Ice/cold pack over area for 10-15 min twice daily.  OK to take Tylenol 1000 mg (2 extra strength tabs) or 975 mg (3 regular strength tabs) every 6 hours as needed.  Bring sugars to next appt also.  Let us know if you need anything.

## 2017-04-09 NOTE — Progress Notes (Signed)
Pre visit review using our clinic review tool, if applicable. No additional management support is needed unless otherwise documented below in the visit note. 

## 2017-04-09 NOTE — Progress Notes (Signed)
Subjective:   Chief Complaint  Patient presents with  . Follow-up    Cristina Burns is a 57 y.o. female here for follow-up of diabetes.   Cristina Burns's self monitored glucose range is high 100's to 300's on average.  Patient denies hypoglycemic reactions. She checks her glucose levels 4 times per day. Patient does require insulin.  toujeo 68 units twice daily, Humalog 1 unit for every 8 points over 140 her glucose is, typically around 20 u per meal Medications include: Metformin 1000 mg BID, Amaryl 4 mg bid Patient exercises 2-3 days per week on average.   She does take an aspirin daily. Statin? Yes ACEi/ARB? Yes  Hypothyroidism Patient presents for follow-up of hypothyroidism.  Reports compliance with medication. Current symptoms include: fatigue, weight gain and feeling slow Denies: constipation, swelling, losing hair, anxiousness and feeling excessive energy She believes her dose should be increased  R hip is starting to flare again.  No injury or change in activity.  She got an injection in the past that was helpful.  She would like another one today.  No numbness or tingling.   Past Medical History:  Diagnosis Date  . ADHD (attention deficit hyperactivity disorder)   . Allergic rhinitis   . Allergy   . Diabetes mellitus without complication ([place])   . Diabetic neuropathy ([place])   . GERD (gastroesophageal reflux disease)   . Hypertension   . Hypothyroidism   . RLS (restless legs syndrome)   . Stomach ulcer "it's been a long time"    Past Surgical History:  Procedure Laterality Date  . ABDOMINAL HYSTERECTOMY  2002  . ACHILLES TENDON REPAIR  2009  . CHOLECYSTECTOMY  1982  . ROTATOR CUFF REPAIR Right 2011    Social History   Socioeconomic History  . Marital status: Single   Current Outpatient Medications on File Prior to Visit  Medication Sig Dispense Refill  . amLODipine (NORVASC) 5 MG tablet TAKE 1 TABLET BY MOUTH ONCE DAILY 30 tablet 5  . atorvastatin (LIPITOR)  40 MG tablet Take 1 tablet (40 mg total) by mouth daily. 30 tablet 5  . cetirizine (ZYRTEC) 10 MG tablet Take 10 mg by mouth daily.    . cholecalciferol (VITAMIN D) 1000 units tablet Take 1 tablet (1,000 Units total) by mouth daily. 30 tablet 0  . cyclobenzaprine (FLEXERIL) 10 MG tablet Take 1 tablet (10 mg total) by mouth 3 (three) times daily as needed for muscle spasms. 20 tablet 0  . FREESTYLE LITE test strip USE TO TEST BLOOD GLUCOSE FOUR TIMES DAILY DX: E11.42 200 each 3  . gabapentin (NEURONTIN) 600 MG tablet TAKE 1 TABLET BY MOUTH THREE TIMES DAILY 270 tablet 0  . gabapentin (NEURONTIN) 600 MG tablet TAKE 1 TABLET BY MOUTH THREE TIMES DAILY 270 tablet 0  . glimepiride (AMARYL) 4 MG tablet TAKE 1 TABLET BY MOUTH TWICE DAILY BEFORE MEAL(S) 60 tablet 5  . Insulin Glargine (TOUJEO SOLOSTAR) 300 UNIT/ML SOPN Inject 68 Units into the skin 2 (two) times daily. 15 pen 3  . insulin lispro (HUMALOG) 100 UNIT/ML KiwkPen 5 units with every meal plus sliding scale 1 u per 10 g carbs. 15 mL 2  . levothyroxine (SYNTHROID, LEVOTHROID) 150 MCG tablet Take 1 tablet (150 mcg total) by mouth daily before breakfast. 90 tablet 0  . losartan (COZAAR) 25 MG tablet Take 1 tablet (25 mg total) by mouth daily. 45 tablet 0  . metFORMIN (GLUCOPHAGE-XR) 500 MG 24 hr tablet Take 2 tablets (1,000 mg  total) by mouth 2 (two) times daily. 360 tablet 0  . methylphenidate (RITALIN LA) 20 MG 24 hr capsule Take 1 capsule (20 mg total) by mouth every morning. 30 capsule 0  . [START ON 04/24/2017] methylphenidate (RITALIN) 10 MG tablet Take 10 mg daily with lunch. 30 tablet 0  . methylphenidate (RITALIN) 10 MG tablet Take 10 mg daily with lunch. 30 tablet 0  . methylphenidate (RITALIN) 10 MG tablet Take 1 tablet (10 mg total) by mouth daily with lunch. 30 tablet 0  . Methylphenidate HCl ER, XR, 20 MG CP24 Take 20 mg by mouth daily. 30 capsule 0  . multivitamin-iron-minerals-folic acid (CENTRUM) chewable tablet Chew 1 tablet by mouth  daily.    . pantoprazole (PROTONIX) 40 MG tablet TAKE 1 TABLET BY MOUTH TWICE DAILY 60 tablet 1  . rOPINIRole (REQUIP) 0.5 MG tablet TAKE ONE TABLET BY MOUTH IN THE MORNING, ONE TABLET IN THE AFTERNOON, AND TWO TABLETS ONCE AT BEDTIME 120 tablet 5  . triamcinolone (NASACORT ALLERGY 24HR) 55 MCG/ACT AERO nasal inhaler Place 1 spray into the nose daily.    Marland Kitchen triamterene-hydrochlorothiazide (MAXZIDE-25) 37.5-25 MG tablet Take 1 tablet daily by mouth. 30 tablet 1  . Methylphenidate HCl ER, XR, 20 MG CP24 Take 20 mg by mouth daily. 30 capsule 0   Related testing: Date of retinal exam: 12/2016  Done by:  Dr. Len Childs Pneumovax: not done Flu Shot: done  Review of Systems: Eye:  No recent significant change in vision Pulmonary:  No SOB Cardiovascular:  No chest pain, no palpitations Skin/Integumentary ROS:  No abnormal skin lesions reported Neurologic:  No numbness, tingling  Objective:  BP 138/72 (BP Location: Left Arm, Patient Position: Sitting, Cuff Size: Normal)   Pulse 84   Temp 98.1 F (36.7 C) (Oral)   Ht 5\' 2"  (1.575 m)   Wt 206 lb (93.4 kg)   SpO2 95%   BMI 37.68 kg/m  General:  Well developed, well nourished, in no apparent distress Skin:  Warm, no pallor or diaphoresis Head:  Normocephalic, atraumatic Eyes:  Pupils equal and round, sclera anicteric without injection  Nose:  External nares without trauma, no discharge Throat/Pharynx:  Lips and gingiva without lesion Neck: Neck supple.  No obvious thyromegaly or masses.  No bruits Lungs:  clear to auscultation, breath sounds equal bilaterally, no wheezes, rales, or stridor Cardio:  regular rate and rhythm without murmurs, no bruits, no LE edema Psych: Age appropriate judgment and insight  Procedure note: Greater trochanteric bursa injection, R Verbal consent obtained. The area of interest was palpated and demarcated with an otoscope speculum. It was cleaned with an alcohol swab. Freeze spray was used. A 27 g needle was  inserted at a perpendicular angle through the area of interested. The plunger was withdrawn to ensure our placement was not in a vessel. 2 mL of 1% lidocaine without epi and 40 mg of Depo was injected. A bandaid was placed. The patient tolerated the procedure well.  There were no complications noted.    Assessment:   Type 2 diabetes mellitus with hyperglycemia, with long-term current use of insulin (HCC) - Plan: Insulin Glargine (TOUJEO SOLOSTAR) 300 UNIT/ML SOPN, Hemoglobin A1c  Greater trochanteric bursitis of right hip - Plan: methylPREDNISolone acetate (DEPO-MEDROL) injection 40 mg, PR DRAIN/INJECT LARGE JOINT/BURSA  Hypothyroidism, unspecified type - Plan: TSH   Plan:   Orders as above. Increase Toujeo to 72 units twice daily, we will add a base of 5 units plus her current sliding scale.  Counseled on diet and exercise. Ice, injection today.  Offered physical therapy but she refused at this time. Check TSH. F/u in 3 mo. The patient voiced understanding and agreement to the plan.  [place], DO 04/09/17 4:52 PM

## 2017-04-10 LAB — TSH: TSH: 0.35 u[IU]/mL (ref 0.35–4.50)

## 2017-04-10 LAB — HEMOGLOBIN A1C: HEMOGLOBIN A1C: 9.2 % — AB (ref 4.6–6.5)

## 2017-04-12 ENCOUNTER — Other Ambulatory Visit: Payer: Self-pay | Admitting: Family Medicine

## 2017-04-13 ENCOUNTER — Encounter: Payer: Self-pay | Admitting: Family Medicine

## 2017-04-30 ENCOUNTER — Telehealth: Payer: Self-pay

## 2017-04-30 NOTE — Telephone Encounter (Signed)
PA approved.   Request Reference Number: [id number]. METHYLPHENID TAB 10MG  is approved through 05/01/2018. For further questions, call [id number].

## 2017-04-30 NOTE — Telephone Encounter (Signed)
PA initiated via Covermymeds; KEY: [id number]. Awaiting determination.

## 2017-05-15 ENCOUNTER — Other Ambulatory Visit: Payer: Self-pay | Admitting: Family Medicine

## 2017-05-15 ENCOUNTER — Encounter: Payer: Self-pay | Admitting: Family Medicine

## 2017-05-15 MED ORDER — LEVOTHYROXINE SODIUM 150 MCG PO TABS: 150.0000 ug | ORAL_TABLET | Freq: Every day | ORAL | 0 refills | Status: DC

## 2017-05-19 ENCOUNTER — Other Ambulatory Visit: Payer: Self-pay | Admitting: Family Medicine

## 2017-05-21 ENCOUNTER — Encounter: Payer: Self-pay | Admitting: Family Medicine

## 2017-05-21 ENCOUNTER — Ambulatory Visit (INDEPENDENT_AMBULATORY_CARE_PROVIDER_SITE_OTHER): Payer: PRIVATE HEALTH INSURANCE | Admitting: Family Medicine

## 2017-05-21 VITALS — BP 132/68 | HR 105 | Temp 98.0°F | Resp 16 | Ht 62.0 in | Wt 208.4 lb

## 2017-05-21 DIAGNOSIS — M7061 Trochanteric bursitis, right hip: Secondary | ICD-10-CM | POA: Diagnosis not present

## 2017-05-21 DIAGNOSIS — Z2821 Immunization not carried out because of patient refusal: Secondary | ICD-10-CM | POA: Diagnosis not present

## 2017-05-21 DIAGNOSIS — E1165 Type 2 diabetes mellitus with hyperglycemia: Secondary | ICD-10-CM | POA: Diagnosis not present

## 2017-05-21 DIAGNOSIS — Z5320 Procedure and treatment not carried out because of patient's decision for unspecified reasons: Secondary | ICD-10-CM

## 2017-05-21 DIAGNOSIS — Z794 Long term (current) use of insulin: Secondary | ICD-10-CM

## 2017-05-21 DIAGNOSIS — I10 Essential (primary) hypertension: Secondary | ICD-10-CM

## 2017-05-21 MED ORDER — EMPAGLIFLOZIN 10 MG PO TABS: 10.0000 mg | ORAL_TABLET | Freq: Every day | ORAL | 2 refills | Status: DC

## 2017-05-21 NOTE — Patient Instructions (Addendum)
Try to keep the diet clean. Continue to check sugars. Let me know if the medicine is not covered.   Stay as active as your hip will allow. Ice/cold pack over area for 10-15 min twice daily. Send me a MyChart message in 4 weeks if you are not getting better. We will try physical therapy.  Gluteus Medius Syndrome Rehab It is normal to feel mild stretching, pulling, tightness, or discomfort as you do these exercises, but you should stop right away if you feel sudden pain or your pain gets worse.   Stretching and range of motion exercise This exercise warms up your muscles and joints and improves the movement and flexibility of your hip and pelvis. This exercise also helps to relieve pain and stiffness. Exercise A: Lunge (hip flexor stretch)     1. Kneel on the floor on your left / right knee. Bend your other knee so it is directly over your ankle. 2. Keep good posture with your head over your shoulders. Tuck your tailbone underneath you. This will prevent your back from arching too much. 3. You should feel a gentle stretch in the front of your thigh or hip. If you do not feel a stretch, slowly lunge forward with your chest up. 4. Hold this position for 30 seconds. 5. Slowly return to the starting position. Repeat 2 times. Complete this exercise 3 times per week. Strengthening exercises These exercises build strength and endurance in your hip and pelvis. Endurance is the ability to use your muscles for a long time, even after they get tired. Exercise B: Bridge (hip extensors)    1. Lie on your back on a firm surface with your knees bent and your feet flat on the floor. 2. Tighten your buttocks muscles and lift your bottom off the floor until the trunk of your body is level with your thighs. ? You should feel the muscles working in your buttocks and the back of your thighs. If this exercise is too easy, cross your arms over your chest or lift one leg while your bottom is up off the floor. ? Do  not arch your back. 3. Hold this position for 3 seconds. 4. Slowly lower your hips to the starting position. 5. Let your muscles relax completely between repetitions. Repeat 2 times. Complete this exercise 3 times per week. Exercise C: Straight leg raises (hip abductors)    1. Lie on your side with your left / right leg in the top position. Lie so your head, shoulder, knee, and hip line up. Bend your bottom knee to help you balance. 2. Lift your top leg up 4-6 inches (10-15 cm), keeping your toes pointed straight ahead. 3. Hold this position for 2 seconds. 4. Slowly lower your leg to the starting position and let your muscles relax completely. Repeat for a total of 10 repetitions. Repeat 2 times. Complete this exercise 3 times per week. Exercise D: Hip abductors and external rotators, quadruped 1. Get on your hands and knees on a firm, lightly padded surface. Your hands should be directly below your shoulders, and your knees should be directly below your hips. 2. Lift your left / right knee out to the side. Keep your knee bent. Do not twist your body. 3. Hold this position for 3 seconds. 4. Slowly lower your leg. Repeat for a total of 10 repetitions.  Repeat 2 times. Complete this exercise 3 times per week. Exercise E: Single leg stand 1. Stand near a counter or door frame  to hold onto as needed. It is helpful to look in a mirror for this exercise so you can watch your hip. 2. Squeeze your left / right buttock muscles then lift up your other foot. Do not let your left / righthip push out to the side. 3. Hold this position for 3 seconds. Repeat for a total of 10 repetitions. Repeat 2 times. Complete this exercise 3 times per week. Make sure you discuss any questions you have with your health care provider. Document Released: 01/06/2005 Document Revised: 09/13/2015 Document Reviewed: 12/19/2014 Elsevier Interactive Patient Education  [employer].

## 2017-05-21 NOTE — Progress Notes (Signed)
Subjective:   Chief Complaint  Patient presents with  . Diabetes    6 week follow up, humalog    Cristina Burns is a 57 y.o. female here for follow-up of diabetes.   Cristina Burns's self monitored glucose range is   Patient denies hypoglycemic reactions. She checks her glucose levels 4 times per day. Patient does require insulin. Toujeo 68 u bid, humalog 5 u w meals +sliding scale 1 u per 10 g of carbs Medications include: Amaryl 4 mg bid, metformin 1000 mg xr bid.  Reports diet is healthy overall.  Patient exercises 0 days per week on average.    Hypertension Patient presents for hypertension follow up. She is compliant with medications- Norvasc 5 mg/d, losartan 25 mg/d, Triamterene 37.5-25 mg/d. Patient has these side effects of medication: none She is not adhering to a healthy diet overall. Exercise: none  Continues to have R hip pain despite steroid injections. No recent injury, this does make it difficult to walk/exercise.  Past Medical History:  Diagnosis Date  . ADHD (attention deficit hyperactivity disorder)   . Allergic rhinitis   . Allergy   . Diabetes mellitus without complication ([place])   . Diabetic neuropathy ([place])   . GERD (gastroesophageal reflux disease)   . Hypertension   . Hypothyroidism   . RLS (restless legs syndrome)   . Stomach ulcer "it's been a long time"    Past Surgical History:  Procedure Laterality Date  . ABDOMINAL HYSTERECTOMY  2002  . ACHILLES TENDON REPAIR  2009  . CHOLECYSTECTOMY  1982  . ROTATOR CUFF REPAIR Right 2011    Family History  Problem Relation Age of Onset  . Heart disease Father   . Heart attack Father   . Heart disease Brother        CABG  . Diabetes Brother   . Kidney disease Brother   . Cirrhosis Brother   . Hypertension Brother   . Heart attack Brother   . Heart disease Brother   . Diabetes Brother   . Heart attack Brother   . Diabetes Mother   . Breast cancer Mother   . Stroke Mother     Current Outpatient  Medications on File Prior to Visit  Medication Sig Dispense Refill  . amLODipine (NORVASC) 5 MG tablet TAKE 1 TABLET BY MOUTH ONCE DAILY 30 tablet 5  . atorvastatin (LIPITOR) 40 MG tablet Take 1 tablet (40 mg total) by mouth daily. 30 tablet 5  . cetirizine (ZYRTEC) 10 MG tablet Take 10 mg by mouth daily.    . cholecalciferol (VITAMIN D) 1000 units tablet Take 1 tablet (1,000 Units total) by mouth daily. 30 tablet 0  . FREESTYLE LITE test strip USE TO TEST BLOOD GLUCOSE FOUR TIMES DAILY DX: E11.42 200 each 3  . gabapentin (NEURONTIN) 600 MG tablet TAKE 1 TABLET BY MOUTH THREE TIMES DAILY 270 tablet 0  . gabapentin (NEURONTIN) 600 MG tablet TAKE 1 TABLET BY MOUTH THREE TIMES DAILY 270 tablet 0  . glimepiride (AMARYL) 4 MG tablet TAKE 1 TABLET BY MOUTH TWICE DAILY BEFORE MEAL(S) 60 tablet 5  . Insulin Glargine (TOUJEO SOLOSTAR) 300 UNIT/ML SOPN Inject 72 Units into the skin 2 (two) times daily. 15 pen 3  . insulin lispro (HUMALOG KWIKPEN) 100 UNIT/ML KiwkPen INJECT 5 UNITS SUBCUTANEOUSLY THREE TIMES DAILY WITH MEALS PLUS SLIDING SCALE 1 UNIT PER 10 GRAMS OF CARBS 15 mL 2  . insulin lispro (HUMALOG) 100 UNIT/ML KiwkPen 5 units with every meal plus sliding  scale as detailed in your AVS. 15 mL 2  . levothyroxine (SYNTHROID, LEVOTHROID) 150 MCG tablet TAKE 1 TABLET BY MOUTH ONCE DAILY BEFORE BREAKFAST 90 tablet 0  . levothyroxine (SYNTHROID, LEVOTHROID) 150 MCG tablet Take 1 tablet (150 mcg total) by mouth daily before breakfast. 90 tablet 0  . losartan (COZAAR) 25 MG tablet TAKE 1 TABLET BY MOUTH ONCE DAILY 45 tablet 0  . metFORMIN (GLUCOPHAGE-XR) 500 MG 24 hr tablet Take 2 tablets (1,000 mg total) by mouth 2 (two) times daily. 360 tablet 0  . methylphenidate (RITALIN LA) 20 MG 24 hr capsule Take 1 capsule (20 mg total) by mouth every morning. 30 capsule 0  . methylphenidate (RITALIN) 10 MG tablet Take 10 mg daily with lunch. 30 tablet 0  . methylphenidate (RITALIN) 10 MG tablet Take 10 mg daily with  lunch. 30 tablet 0  . methylphenidate (RITALIN) 10 MG tablet Take 1 tablet (10 mg total) by mouth daily with lunch. 30 tablet 0  . Methylphenidate HCl ER, XR, 20 MG CP24 Take 20 mg by mouth daily. 30 capsule 0  . Methylphenidate HCl ER, XR, 20 MG CP24 Take 20 mg by mouth daily. 30 capsule 0  . multivitamin-iron-minerals-folic acid (CENTRUM) chewable tablet Chew 1 tablet by mouth daily.    . pantoprazole (PROTONIX) 40 MG tablet TAKE 1 TABLET BY MOUTH TWICE DAILY 60 tablet 1  . rOPINIRole (REQUIP) 0.5 MG tablet TAKE ONE TABLET BY MOUTH IN THE MORNING, ONE TABLET IN THE AFTERNOON, AND TWO TABLETS ONCE AT BEDTIME 120 tablet 5  . triamcinolone (NASACORT ALLERGY 24HR) 55 MCG/ACT AERO nasal inhaler Place 1 spray into the nose daily.    Marland Kitchen triamterene-hydrochlorothiazide (MAXZIDE-25) 37.5-25 MG tablet Take 1 tablet daily by mouth. 30 tablet 1  . triamterene-hydrochlorothiazide (MAXZIDE-25) 37.5-25 MG tablet TAKE 1 TABLET BY MOUTH ONCE DAILY **PATIENT NEEDS APPOINTMENT FOR MORE REFILLS** 90 tablet 0   Review of Systems: Pulmonary:  No SOB Cardiovascular:  No chest pain  Objective:  BP 132/68 (BP Location: Left Arm, Patient Position: Sitting, Cuff Size: Large)   Pulse (!) 105   Temp 98 F (36.7 C) (Oral)   Resp 16   Ht 5\' 2"  (1.575 m)   Wt 208 lb 6.4 oz (94.5 kg)   SpO2 98%   BMI 38.12 kg/m  General:  Well developed, well nourished, in no apparent distress Lungs:  C no access msc use Psych: Age appropriate judgment and insight  Assessment:   Type 2 diabetes mellitus with hyperglycemia, with long-term current use of insulin ([place]) - Plan: Insulin Glargine (TOUJEO SOLOSTAR) 300 UNIT/ML SOPN  Essential hypertension  Greater trochanteric bursitis of right hip  Refuses tetanus, diphtheria, and acellular pertussis (Tdap) vaccination  Refused pneumococcal vaccination  Colonoscopy refused   Plan:   Increase Toujeo to 75 u bid. Change base humalog to 10 u with meals + sliding scale, 1 u for  every 8 points over 140. We will also try SGLT2 blocker. Payment cards given.  Cont meds for HTN.  Declines tdap and PCV23. Discussed colonoscopy, she seems somewhat willing, will let us know in future. Eye exam coming up.  Counseled on diet and exercise. She really needs to do better with this.  Ice, home stretches/exercises, if no improvement in next 4 weeks will let us know and we will try PT.  F/u in 2 mo. The patient voiced understanding and agreement to the plan.  Gold[place], DO 05/21/17 4:40 PM

## 2017-06-01 ENCOUNTER — Other Ambulatory Visit: Payer: Self-pay | Admitting: Family Medicine

## 2017-06-04 ENCOUNTER — Encounter: Payer: Self-pay | Admitting: Family Medicine

## 2017-06-08 ENCOUNTER — Encounter: Payer: Self-pay | Admitting: Family Medicine

## 2017-06-08 MED ORDER — PANTOPRAZOLE SODIUM 40 MG PO TBEC: 40.0000 mg | DELAYED_RELEASE_TABLET | Freq: Two times a day (BID) | ORAL | 2 refills | Status: DC

## 2017-06-08 NOTE — Telephone Encounter (Signed)
Melissa -- please advise in PCP's absence if ok to send refills of pantoprazole and methylphenidate.  Last methylphenidate RX: 04/24/17 10mg  daily w/lunch, #30. Filled 05/01/17         Methylphenidate LA 20mg  once a day written on 02/07/17 and filled 04/29/17.  Last OV: 05/21/17 Next OV: 07/30/17 UDS: not on file CSC: not on file CSR: Please see [facility]AR[facility] report in red folder.

## 2017-06-08 NOTE — Telephone Encounter (Signed)
Dr. Nani Ravens, please see request below.

## 2017-06-09 ENCOUNTER — Other Ambulatory Visit: Payer: Self-pay | Admitting: Family Medicine

## 2017-06-09 DIAGNOSIS — F988 Other specified behavioral and emotional disorders with onset usually occurring in childhood and adolescence: Secondary | ICD-10-CM

## 2017-06-09 MED ORDER — METHYLPHENIDATE HCL 10 MG PO TABS: ORAL_TABLET | ORAL | 0 refills | Status: DC

## 2017-06-09 MED ORDER — METHYLPHENIDATE HCL ER (LA) 20 MG PO CP24: 20.0000 mg | ORAL_CAPSULE | ORAL | 0 refills | Status: DC

## 2017-06-09 MED ORDER — METHYLPHENIDATE HCL 10 MG PO TABS: 10.0000 mg | ORAL_TABLET | Freq: Two times a day (BID) | ORAL | 0 refills | Status: DC

## 2017-06-16 ENCOUNTER — Encounter: Payer: Self-pay | Admitting: Family Medicine

## 2017-06-17 ENCOUNTER — Other Ambulatory Visit: Payer: Self-pay | Admitting: Family Medicine

## 2017-06-17 ENCOUNTER — Telehealth: Payer: Self-pay | Admitting: Family Medicine

## 2017-06-17 NOTE — Telephone Encounter (Signed)
Copied from [place] [id number]. Topic: Quick Communication - Rx Refill/Question >> Jun 17, 2017  4:09 PM Oliver Pila B wrote: Medication: insulin lispro (HUMALOG KWIKPEN) 100 UNIT/ML Mayer Masker [[id number]]   [facility] called about the Rx above and is needing to know what is the daily max dose for the medication above, they are needing specifics due to insurance, call pharmacy

## 2017-06-18 NOTE — Telephone Encounter (Signed)
Called the pharmacy and gave them the provider sliding scale specific instructions

## 2017-06-18 NOTE — Telephone Encounter (Signed)
Pharmacy is needing a max daily dose on the kwikpen due to insurance- can someone from the office call them.

## 2017-06-23 ENCOUNTER — Other Ambulatory Visit: Payer: Self-pay | Admitting: Family Medicine

## 2017-06-23 DIAGNOSIS — E1165 Type 2 diabetes mellitus with hyperglycemia: Secondary | ICD-10-CM

## 2017-06-23 DIAGNOSIS — Z794 Long term (current) use of insulin: Secondary | ICD-10-CM

## 2017-07-16 ENCOUNTER — Other Ambulatory Visit: Payer: Self-pay | Admitting: Family Medicine

## 2017-07-30 ENCOUNTER — Other Ambulatory Visit: Payer: Self-pay | Admitting: Family Medicine

## 2017-07-30 ENCOUNTER — Ambulatory Visit (INDEPENDENT_AMBULATORY_CARE_PROVIDER_SITE_OTHER): Payer: PRIVATE HEALTH INSURANCE | Admitting: Family Medicine

## 2017-07-30 ENCOUNTER — Encounter: Payer: Self-pay | Admitting: Family Medicine

## 2017-07-30 VITALS — BP 139/74 | HR 104 | Temp 98.3°F | Ht 62.0 in | Wt 217.8 lb

## 2017-07-30 DIAGNOSIS — M7989 Other specified soft tissue disorders: Secondary | ICD-10-CM | POA: Diagnosis not present

## 2017-07-30 DIAGNOSIS — M25551 Pain in right hip: Secondary | ICD-10-CM

## 2017-07-30 DIAGNOSIS — E1165 Type 2 diabetes mellitus with hyperglycemia: Secondary | ICD-10-CM

## 2017-07-30 DIAGNOSIS — Z794 Long term (current) use of insulin: Secondary | ICD-10-CM | POA: Diagnosis not present

## 2017-07-30 MED ORDER — MONTELUKAST SODIUM 10 MG PO TABS: 10.0000 mg | ORAL_TABLET | Freq: Every day | ORAL | 3 refills | Status: DC

## 2017-07-30 MED ORDER — ATORVASTATIN CALCIUM 40 MG PO TABS
40.0000 mg | ORAL_TABLET | Freq: Every day | ORAL | 5 refills | Status: DC
Start: 2017-07-30 — End: 2018-02-03

## 2017-07-30 NOTE — Patient Instructions (Addendum)
If you do not hear anything about your referral/imaging study in the next 1-2 weeks, call our office and ask for an update.  We will be in touch regarding your results.

## 2017-07-30 NOTE — Progress Notes (Signed)
Subjective:   Chief Complaint  Patient presents with  . Follow-up    Pt here for 2 month f/u visit    Cristina Burns is a 57 y.o. female here for follow-up of diabetes.   Mammie's self monitored glucose range is 80-500 Patient denies hypoglycemic reactions. She checks her glucose levels 4 times per day. Patient does require insulin.   Medications include: Amaryl and metformin. Did not tolerate SGLT-2. Reports diet is unhealthy overall. Patient exercises 0 days per week on average.    +LE edema over past mo, b/l, no pain. No change in PO intake, injury, recent travel, med changes. +associated sob. Denies fevers, chest pain.  +continued R hip pain. Failed injections and noncompliant with HEP. Ready for PT at this point .  Past Medical History:  Diagnosis Date  . ADHD (attention deficit hyperactivity disorder)   . Allergic rhinitis   . Allergy   . Diabetes mellitus without complication ([place])   . Diabetic neuropathy ([place])   . GERD (gastroesophageal reflux disease)   . Hypertension   . Hypothyroidism   . RLS (restless legs syndrome)   . Stomach ulcer "it's been a long time"    Past Surgical History:  Procedure Laterality Date  . ABDOMINAL HYSTERECTOMY  2002  . ACHILLES TENDON REPAIR  2009  . CHOLECYSTECTOMY  1982  . ROTATOR CUFF REPAIR Right 2011    Family History  Problem Relation Age of Onset  . Heart disease Father   . Heart attack Father   . Heart disease Brother        CABG  . Diabetes Brother   . Kidney disease Brother   . Cirrhosis Brother   . Hypertension Brother   . Heart attack Brother   . Heart disease Brother   . Diabetes Brother   . Heart attack Brother   . Diabetes Mother   . Breast cancer Mother   . Stroke Mother    Current Outpatient Medications on File Prior to Visit  Medication Sig Dispense Refill  . amLODipine (NORVASC) 5 MG tablet TAKE 1 TABLET BY MOUTH ONCE DAILY 30 tablet 5  . atorvastatin (LIPITOR) 40 MG tablet Take 1 tablet (40 mg  total) by mouth daily. 30 tablet 5  . cetirizine (ZYRTEC) 10 MG tablet Take 10 mg by mouth daily.    . cholecalciferol (VITAMIN D) 1000 units tablet Take 1 tablet (1,000 Units total) by mouth daily. 30 tablet 0  . FREESTYLE LITE test strip USE TO TEST BLOOD GLUCOSE FOUR TIMES DAILY DX: E11.42 200 each 3  . gabapentin (NEURONTIN) 600 MG tablet TAKE 1 TABLET BY MOUTH THREE TIMES DAILY 270 tablet 0  . glimepiride (AMARYL) 4 MG tablet TAKE 1 TABLET BY MOUTH TWICE DAILY BEFORE MEAL(S) 60 tablet 5  . Insulin Glargine (TOUJEO SOLOSTAR) 300 UNIT/ML SOPN Inject 75 Units into the skin 2 (two) times daily. 15 pen 3  . insulin lispro (HUMALOG KWIKPEN) 100 UNIT/ML KiwkPen INJECT 10 UNITS SUBCUTANEOUSLY THREE TIMES DAILY WITH MEALS PLUS SLIDING SCALE 1 UNIT PER 8 GRAMS OF CARBS 15 mL 2  . insulin lispro (HUMALOG) 100 UNIT/ML KiwkPen 5 units with every meal plus sliding scale as detailed in your AVS. 15 mL 2  . levothyroxine (SYNTHROID, LEVOTHROID) 150 MCG tablet TAKE 1 TABLET BY MOUTH ONCE DAILY BEFORE BREAKFAST 90 tablet 0  . loratadine (CLARITIN) 10 MG tablet Take 10 mg by mouth daily.    Marland Kitchen losartan (COZAAR) 25 MG tablet TAKE 1 TABLET BY MOUTH  ONCE DAILY 45 tablet 0  . metFORMIN (GLUCOPHAGE-XR) 500 MG 24 hr tablet Take 2 tablets (1,000 mg total) by mouth 2 (two) times daily. 360 tablet 0  . metFORMIN (GLUCOPHAGE-XR) 500 MG 24 hr tablet TAKE 2 TABLETS BY MOUTH TWICE DAILY 360 tablet 1  . methylphenidate (RITALIN LA) 20 MG 24 hr capsule Take 1 capsule (20 mg total) by mouth every morning. 30 capsule 0  . methylphenidate (RITALIN LA) 20 MG 24 hr capsule Take 1 capsule (20 mg total) by mouth every morning. 30 capsule 0  . [START ON 08/08/2017] methylphenidate (RITALIN LA) 20 MG 24 hr capsule Take 1 capsule (20 mg total) by mouth every morning. 30 capsule 0  . methylphenidate (RITALIN) 10 MG tablet Take 10 mg daily with lunch. 30 tablet 0  . methylphenidate (RITALIN) 10 MG tablet Take 1 tablet (10 mg total) by mouth  2 (two) times daily. 30 tablet 0  . [START ON 08/08/2017] methylphenidate (RITALIN) 10 MG tablet Take 1 tablet (10 mg total) by mouth 2 (two) times daily. 30 tablet 0  . multivitamin-iron-minerals-folic acid (CENTRUM) chewable tablet Chew 1 tablet by mouth daily.    . pantoprazole (PROTONIX) 40 MG tablet Take 1 tablet (40 mg total) by mouth 2 (two) times daily. 60 tablet 2  . rOPINIRole (REQUIP) 0.5 MG tablet TAKE ONE TABLET BY MOUTH IN THE MORNING, ONE TABLET IN THE AFTERNOON, AND TWO TABLETS ONCE AT BEDTIME 120 tablet 5  . triamcinolone (NASACORT ALLERGY 24HR) 55 MCG/ACT AERO nasal inhaler Place 1 spray into the nose daily.    Marland Kitchen triamterene-hydrochlorothiazide (MAXZIDE-25) 37.5-25 MG tablet TAKE 1 TABLET BY MOUTH ONCE DAILY **PATIENT NEEDS APPOINTMENT FOR MORE REFILLS** 90 tablet 0   Related testing: Date of retinal exam: 12/2016   Pneumovax: refuses  Review of Systems: Pulmonary:  No SOB Cardiovascular:  No chest pain  Objective:  BP 139/74 (BP Location: Left Arm, Patient Position: Sitting, Cuff Size: Normal)   Pulse (!) 104   Temp 98.3 F (36.8 C) (Oral)   Ht 5\' 2"  (1.575 m)   Wt 217 lb 12.8 oz (98.8 kg)   SpO2 99%   BMI 39.84 kg/m  General:  Well developed, well nourished, in no apparent distress Skin:  Warm, no pallor or diaphoresis Head:  Normocephalic, atraumatic Eyes:  Pupils equal and round, sclera anicteric without injection  Lungs:  CTAB, no access msc use Cardio:  RRR, no bruits, 3+ pitting b/l LE edema Psych: Age appropriate judgment and insight  Assessment:   Type 2 diabetes mellitus with hyperglycemia, with long-term current use of insulin ([facility]) - Plan: Hemoglobin A1c  Localized swelling of both lower extremities - Plan: Comprehensive metabolic panel, ECHOCARDIOGRAM COMPLETE  Greater trochanteric pain syndrome of right lower extremity - Plan: Ambulatory referral to Physical Therapy   Plan:   Orders as above. If her A1c does not improve, I think I need to  send her to endo. Her sugars are fluctuating where I don't think it would be safe to alter her insulin much more. Could try Actos or GLP-1 if insurance will cover. Counseled on diet and exercise. Need to get in with PT to help with hip. If no improvement, will consider referral to sports med.  Ck Echo given breathing complaints and LE edema. Elevate legs and mind salt intake. Rec'd she wear her comp stockings. Add Singulair to allergy regimen. F/u in 6 weeks to recheck allergies. The patient voiced understanding and agreement to the plan.  Crosby Oyster [place],  DO 07/30/17 5:01 PM

## 2017-07-31 ENCOUNTER — Other Ambulatory Visit: Payer: Self-pay | Admitting: Family Medicine

## 2017-07-31 LAB — COMPREHENSIVE METABOLIC PANEL
ALT: 24 U/L (ref 0–35)
AST: 21 U/L (ref 0–37)
Albumin: 4.4 g/dL (ref 3.5–5.2)
Alkaline Phosphatase: 135 U/L — ABNORMAL HIGH (ref 39–117)
BUN: 24 mg/dL — ABNORMAL HIGH (ref 6–23)
CALCIUM: 9.8 mg/dL (ref 8.4–10.5)
CHLORIDE: 104 meq/L (ref 96–112)
CO2: 27 meq/L (ref 19–32)
Creatinine, Ser: 1.04 mg/dL (ref 0.40–1.20)
GFR: 58.05 mL/min — AB (ref >60.00)
Glucose, Bld: 164 mg/dL — ABNORMAL HIGH (ref 70–99)
POTASSIUM: 5.1 meq/L (ref 3.5–5.1)
Sodium: 145 mEq/L (ref 135–145)
Total Bilirubin: 0.3 mg/dL (ref 0.2–1.2)
Total Protein: 7.5 g/dL (ref 6.0–8.3)

## 2017-07-31 LAB — HEMOGLOBIN A1C: Hgb A1c MFr Bld: 8.5 % — ABNORMAL HIGH (ref 4.6–6.5)

## 2017-07-31 MED ORDER — PIOGLITAZONE HCL 30 MG PO TABS: 30.0000 mg | ORAL_TABLET | Freq: Every day | ORAL | 3 refills | Status: DC

## 2017-08-11 ENCOUNTER — Other Ambulatory Visit: Payer: Self-pay | Admitting: Family Medicine

## 2017-08-11 MED ORDER — TRIAMTERENE-HCTZ 37.5-25 MG PO TABS: 1.0000 | ORAL_TABLET | Freq: Every day | ORAL | 0 refills | Status: DC

## 2017-08-11 MED ORDER — LEVOTHYROXINE SODIUM 150 MCG PO TABS: 150.0000 ug | ORAL_TABLET | Freq: Every day | ORAL | 0 refills | Status: DC

## 2017-08-18 ENCOUNTER — Ambulatory Visit (HOSPITAL_BASED_OUTPATIENT_CLINIC_OR_DEPARTMENT_OTHER)
Admission: RE | Admit: 2017-08-18 | Discharge: 2017-08-18 | Disposition: A | Discharge status: Home / Self Care | Payer: PRIVATE HEALTH INSURANCE | Source: Ambulatory Visit | Attending: Family Medicine | Admitting: Family Medicine

## 2017-08-18 DIAGNOSIS — M7989 Other specified soft tissue disorders: Secondary | ICD-10-CM | POA: Insufficient documentation

## 2017-08-18 DIAGNOSIS — E119 Type 2 diabetes mellitus without complications: Secondary | ICD-10-CM | POA: Diagnosis not present

## 2017-08-18 DIAGNOSIS — I10 Essential (primary) hypertension: Secondary | ICD-10-CM | POA: Insufficient documentation

## 2017-08-18 DIAGNOSIS — R600 Localized edema: Secondary | ICD-10-CM

## 2017-08-18 NOTE — Progress Notes (Signed)
  Echocardiogram 2D Echocardiogram has been performed.  Kayra Crowell T Takelia Urieta 08/18/2017, 3:52 PM

## 2017-08-19 ENCOUNTER — Encounter: Payer: Self-pay | Admitting: Family Medicine

## 2017-08-27 ENCOUNTER — Other Ambulatory Visit: Payer: Self-pay | Admitting: Family Medicine

## 2017-08-27 DIAGNOSIS — E1165 Type 2 diabetes mellitus with hyperglycemia: Secondary | ICD-10-CM

## 2017-08-27 DIAGNOSIS — Z794 Long term (current) use of insulin: Secondary | ICD-10-CM

## 2017-08-27 DIAGNOSIS — F988 Other specified behavioral and emotional disorders with onset usually occurring in childhood and adolescence: Secondary | ICD-10-CM

## 2017-08-27 MED ORDER — GABAPENTIN 600 MG PO TABS: 600.0000 mg | ORAL_TABLET | Freq: Three times a day (TID) | ORAL | 0 refills | Status: DC

## 2017-08-28 MED ORDER — METHYLPHENIDATE HCL ER (LA) 20 MG PO CP24: 20.0000 mg | ORAL_CAPSULE | ORAL | 0 refills | Status: DC

## 2017-08-28 MED ORDER — LEVOTHYROXINE SODIUM 150 MCG PO TABS: 150.0000 ug | ORAL_TABLET | Freq: Every day | ORAL | 0 refills | Status: DC

## 2017-08-28 MED ORDER — METHYLPHENIDATE HCL 10 MG PO TABS: ORAL_TABLET | ORAL | 0 refills | Status: DC

## 2017-08-28 MED ORDER — LOSARTAN POTASSIUM 25 MG PO TABS
25.0000 mg | ORAL_TABLET | Freq: Every day | ORAL | 0 refills | Status: DC
Start: 2017-08-28 — End: 2017-12-01

## 2017-08-28 MED ORDER — TRIAMTERENE-HCTZ 37.5-25 MG PO TABS: 1.0000 | ORAL_TABLET | Freq: Every day | ORAL | 0 refills | Status: DC

## 2017-08-28 MED ORDER — METFORMIN HCL ER 500 MG PO TB24: 1000.0000 mg | ORAL_TABLET | Freq: Two times a day (BID) | ORAL | 0 refills | Status: DC

## 2017-08-28 NOTE — Telephone Encounter (Signed)
Requesting: methylphenidate 20mg  24hr qday Contract: none found UDS: none Last OV: 07/30/17 Next Ov: 09/10/17 Last refill: 08/08/17, #30, 0RF Database: no discrepancies   Requesting:methylphenidate 10mg  bid Contract: none UDS: none Last OV: 07/30/17 Next Ov: 09/10/17 Last refill: 08/08/17, #30, 0RF Database: no discrepancies  Please advise.  Pt. Also requesting metformin refill, but last refill appears to be 06/23/17 for 360 tabs, and 1 refill. Cozaar already refilled 8/8 by Dr. Nani Ravens. Levothyroxine and maxzide refilled 7/23 for 3 month supply. All of the above refills refused.

## 2017-09-04 ENCOUNTER — Other Ambulatory Visit: Payer: Self-pay | Admitting: Family Medicine

## 2017-09-04 MED ORDER — AMLODIPINE BESYLATE 5 MG PO TABS: 5.0000 mg | ORAL_TABLET | Freq: Every day | ORAL | 1 refills | Status: DC

## 2017-09-10 ENCOUNTER — Encounter: Payer: Self-pay | Admitting: Family Medicine

## 2017-09-10 ENCOUNTER — Ambulatory Visit (INDEPENDENT_AMBULATORY_CARE_PROVIDER_SITE_OTHER): Payer: PRIVATE HEALTH INSURANCE | Admitting: Family Medicine

## 2017-09-10 VITALS — BP 138/62 | HR 106 | Temp 98.5°F | Ht 62.0 in | Wt 221.1 lb

## 2017-09-10 DIAGNOSIS — M7989 Other specified soft tissue disorders: Secondary | ICD-10-CM

## 2017-09-10 MED ORDER — PANTOPRAZOLE SODIUM 40 MG PO TBEC: 40.0000 mg | DELAYED_RELEASE_TABLET | Freq: Two times a day (BID) | ORAL | 1 refills | Status: DC

## 2017-09-10 MED ORDER — TRIAMCINOLONE ACETONIDE 55 MCG/ACT NA AERO: 2.0000 | INHALATION_SPRAY | Freq: Every day | NASAL | 2 refills | Status: AC

## 2017-09-10 MED ORDER — FUROSEMIDE 40 MG PO TABS: 40.0000 mg | ORAL_TABLET | Freq: Every day | ORAL | 3 refills | Status: DC

## 2017-09-10 NOTE — Patient Instructions (Addendum)
Wear your compression stockings, mind salt intake, keep active, elevate legs.   Call [facility] at [facility] at [phone] for an appointment.  They are located at [address], [place], [place], [address] (right across the hall from our office).  Let us know if you need anything.

## 2017-09-10 NOTE — Progress Notes (Signed)
Chief Complaint  Patient presents with  . Follow-up    Subjective: Patient is a 57 y.o. female here for f.u allergies.  Doing better on Singulair. The nasal congestion/pressure has resolved.   Continuing to have LE edema. Compression stockings are worn intermittently. Stopped Actos with some improvement. Still taking gabapentin. Not physically active. Some elevation with legs, not particularly helpful. Denies CP or SOB.    ROS: Heart: Denies chest pain  Lungs: Denies SOB   Past Medical History:  Diagnosis Date  . ADHD (attention deficit hyperactivity disorder)   . Allergic rhinitis   . Allergy   . Diabetes mellitus without complication ([place])   . Diabetic neuropathy ([place])   . GERD (gastroesophageal reflux disease)   . Hypertension   . Hypothyroidism   . RLS (restless legs syndrome)   . Stomach ulcer "it's been a long time"    Objective: BP 138/62 (BP Location: Left Arm, Patient Position: Sitting, Cuff Size: Normal)   Pulse (!) 106   Temp 98.5 F (36.9 C) (Oral)   Ht 5\' 2"  (1.575 m)   Wt 221 lb 2 oz (100.3 kg)   SpO2 96%   BMI 40.44 kg/m  General: Awake, appears stated age HEENT: MMM, EOMi Heart: RRR, 2+ pitting edema that tapers but does extend beyond knees b/l Lungs: CTAB, no rales, wheezes or rhonchi. No accessory muscle use Psych: Age appropriate judgment and insight, normal affect and mood  Assessment and Plan: Localized swelling of both lower extremities - Plan: furosemide (LASIX) 40 MG tablet, Basic metabolic panel  Orders as above. Start Lasix. No K supp given recent K level and concurrent use of K sparing diuretic. Recheck BMP in 1 week. F/u in 6 weeks with me. The patient voiced understanding and agreement to the plan.  [place], DO 09/10/17  4:42 PM

## 2017-09-10 NOTE — Progress Notes (Signed)
Pre visit review using our clinic review tool, if applicable. No additional management support is needed unless otherwise documented below in the visit note. 

## 2017-09-15 ENCOUNTER — Other Ambulatory Visit: Payer: Self-pay

## 2017-09-15 ENCOUNTER — Encounter: Payer: Self-pay | Admitting: Physical Therapy

## 2017-09-15 ENCOUNTER — Ambulatory Visit: Payer: PRIVATE HEALTH INSURANCE | Attending: Family Medicine | Admitting: Physical Therapy

## 2017-09-15 DIAGNOSIS — R262 Difficulty in walking, not elsewhere classified: Secondary | ICD-10-CM | POA: Diagnosis present

## 2017-09-15 DIAGNOSIS — M25651 Stiffness of right hip, not elsewhere classified: Secondary | ICD-10-CM | POA: Insufficient documentation

## 2017-09-15 DIAGNOSIS — M25551 Pain in right hip: Secondary | ICD-10-CM

## 2017-09-15 DIAGNOSIS — M6281 Muscle weakness (generalized): Secondary | ICD-10-CM | POA: Diagnosis present

## 2017-09-15 NOTE — Therapy (Addendum)
[facility] [address]  [place], [place], [address] Phone: [phone]   Fax:  [phone]  Physical Therapy Evaluation  Patient Details  Name: Cristina Burns MRN: [id number] Date of Birth: Aug 27, 1960 Referring Provider: Shelda Pal, DO   Encounter Date: 09/15/2017  PT End of Session - 09/15/17 1632    Visit Number  1    Number of Visits  17    Date for PT Re-Evaluation  11/10/17    Authorization Type  [employer]; VL 30    Authorization - Visit Number  1    Authorization - Number of Visits  30    PT Start Time  1532    PT Stop Time  1622    PT Time Calculation (min)  50 min    Activity Tolerance  Patient tolerated treatment well;Patient limited by pain    Behavior During Therapy  [facility] for tasks assessed/performed       Past Medical History:  Diagnosis Date  . ADHD (attention deficit hyperactivity disorder)   . Allergic rhinitis   . Allergy   . Diabetes mellitus without complication ([place])   . Diabetic neuropathy ([place])   . GERD (gastroesophageal reflux disease)   . Hypertension   . Hypothyroidism   . RLS (restless legs syndrome)   . Stomach ulcer "it's been a long time"    Past Surgical History:  Procedure Laterality Date  . ABDOMINAL HYSTERECTOMY  2002  . ACHILLES TENDON REPAIR  2009  . CHOLECYSTECTOMY  1982  . ROTATOR CUFF REPAIR Right 2011    There were no vitals filed for this visit.   Subjective Assessment - 09/15/17 1534    Subjective  Patient reports she took BG earlier today and it was 220. Patient reports she has had R hip pain for the past ~6 months. Had 2 injections to R hip with very temporary relief. Current symptoms include: R hip pain over greater trochanter which recently started radiating across LB and to L hip. Denies N/T. Aggravating factors include: stairs, walking slowly, walking or standing for a prolonged period of time, leaning forward over sink when brushing teeth.  Easing factors include: Aleve, sitting down to rest. Feels worst at the end of the day. Pain at best:  1-2/10; at worst: 10/10. Reports MD put her on Lasix this Thursday secondary to swelling in her LEs.     Pertinent History  stomach ulcer, RLS, hypothyroidism, HTN, GERD, diabetic neuropathy, DM, ADHD,R RTC repair 2011, achilles tendon repair 2009     Limitations  Standing;Walking;House hold activities    How long can you sit comfortably?  2.5-3 hours    How long can you stand comfortably?  couple of minutes    How long can you walk comfortably?  couple of minutes    Diagnostic tests  none     Patient Stated Goals  get rid of pain, be able to walk in order to lose weight    Currently in Pain?  Yes    Pain Score  5     Pain Location  Hip    Pain Orientation  Right    Pain Descriptors / Indicators  Aching    Pain Type  Chronic pain    Aggravating Factors   Aggravating factors include: stairs, walking slowly, walking or standing for a prolonged period of time, leaning forward over sink when brushing teeth    Pain Relieving Factors  Easing factors include: aleve, sitting down  to rest.          [facility] PT Assessment - 09/15/17 1553      Assessment   Medical Diagnosis  Greater trochanteric pain syndrome of R LE    Referring Provider  Shelda Pal, DO    Onset Date/Surgical Date  03/18/17    Next MD Visit  10/22/17    Prior Therapy  Yes      Precautions   Precautions  None      Restrictions   Weight Bearing Restrictions  No      Balance Screen   Has the patient fallen in the past 6 months  No    Has the patient had a decrease in activity level because of a fear of falling?   No    Is the patient reluctant to leave their home because of a fear of falling?   No      Home Environment   Living Environment  Private residence    Living Arrangements  Alone    Type of [place] to enter    Entrance Stairs-Number of Steps  8    Entrance Stairs-Rails   [place]  One level      Prior Function   Level of Independence  Independent    Vocation  Full time employment    Vocation Requirements  sitting and typing    Leisure  walking, bowling      Cognition   Overall Cognitive Status  Within Functional Limits for tasks assessed      Observation/Other Assessments   Observations  B lower legs extremely firm and edematous    Focus on Therapeutic Outcomes (FOTO)   Hip: 53 (47% limited, 37% predicted)      Sensation   Light Touch  Appears Intact   reports extreme tenderness d/t neuropathy in calves     Coordination   Gross Motor Movements are Fluid and Coordinated  Yes      Posture/Postural Control   Posture/Postural Control  Postural limitations    Postural Limitations  Rounded Shoulders;Weight shift left      ROM / Strength   AROM / PROM / Strength  AROM;Strength      AROM   AROM Assessment Site  Lumbar;Hip    Right/Left Hip  Right;Left    Right Hip Flexion  78   significant pain with PROM SKTC stretch   Right Hip External Rotation   30    Right Hip Internal Rotation   25   pain in R hip   Right Hip ABduction  27    Left Hip Flexion  90    Left Hip External Rotation   32    Left Hip Internal Rotation   33    Left Hip ABduction  20    Lumbar Flexion  mid shin    Lumbar Extension  WFL   discomfort in R hip   Lumbar - Right Side Bend  jt line    Lumbar - Left Side Bend  jt line    Lumbar - Right Rotation  WFL    Lumbar - Left Rotation  [facility]      Strength   Strength Assessment Site  Hip;Knee;Ankle    Right/Left Hip  Right;Left    Right Hip Flexion  4/5    Right Hip ABduction  4/5    Right Hip ADduction  4/5    Left Hip Flexion  4+/5    Left Hip ABduction  4/5    Left Hip ADduction  4/5    Right/Left Knee  Right;Left    Right Knee Flexion  4+/5    Right Knee Extension  4+/5    Left Knee Flexion  4+/5    Left Knee Extension  4+/5    Right/Left Ankle  Right;Left    Right Ankle Dorsiflexion  4+/5     Right Ankle Plantar Flexion  4/5   pain in R hip   Left Ankle Dorsiflexion  4+/5    Left Ankle Plantar Flexion  4/5      Flexibility   Soft Tissue Assessment /Muscle Length  yes    ITB  R moderately tight      Palpation   Palpation comment  R posterolateral hip with soft tissue restriction and TTP      Ambulation/Gait   Gait Pattern  Step-through pattern;Decreased stance time - right;Decreased hip/knee flexion - right;Decreased weight shift to left;Antalgic    Ambulation Surface  Level;Indoor    Gait velocity  slightly decreased                Objective measurements completed on examination: See above findings.              PT Education - 09/15/17 1632    Education Details  prognosis, POC, HEP    Person(s) Educated  Patient    Methods  Explanation;Demonstration;Tactile cues;Handout;Verbal cues    Comprehension  Returned demonstration;Verbalized understanding       PT Short Term Goals - 09/15/17 1640      PT SHORT TERM GOAL #1   Title  Patient to be independent with initial HEP.    Time  4    Period  Weeks    Status  New    Target Date  10/13/17        PT Long Term Goals - 09/15/17 1640      PT LONG TERM GOAL #1   Title  Patient to be independent with advanced HEP.    Time  8    Period  Weeks    Status  New    Target Date  11/10/17      PT LONG TERM GOAL #2   Title  Patient to demonstrate >=4+/5 strength in B LEs.    Time  8    Period  Weeks    Status  New    Target Date  11/10/17      PT LONG TERM GOAL #3   Title  Patient to demonstrate [facility] and pain-free R hip and lumbar AROM.     Time  8    Period  Weeks    Status  New    Target Date  11/10/17      PT LONG TERM GOAL #4   Title  Patient to demonstrate stair climbing up/down 13 steps with 1 handrail and no c/o pain.    Time  8    Period  Weeks    Status  New    Target Date  11/10/17      PT LONG TERM GOAL #5   Title  Patient to report tolerance of 30 min of walking/standing  without c/o pain.    Time  8    Period  Weeks    Status  New    Target Date  11/10/17             Plan - 09/15/17 1633  Clinical Impression Statement  Patient is a 57y/o F presenting to OPPT with c/o R posterolateral hip pain of 6 months duration. Reports recently this pain has started radiating across LB and into L hip as well. Denies N/T. Aggravating factors include: stairs, walking slowly, walking or standing for a prolonged period of time, leaning forward over sink when brushing teeth. Patient today with painful and limited R hip AROM, slightly painful and limited lumbar AROM, decreased B proximal hip strength, decreased flexibility, and tenderness and soft tissue restriction to R posterolateral hip. Patient educated and received handout on gentle stretching and strengthening HEP. Advised not to push into pain. Patient reported understanding. Would benefit from skilled PT services 2x/week for 8 weeks to address aforementioned impairments.      Clinical Presentation  Stable    Clinical Decision Making  Low    Rehab Potential  Good    Clinical Impairments Affecting Rehab Potential  stomach ulcer, RLS, hypothyroidism, HTN, GERD, diabetic neuropathy, DM, ADHD,R RTC repair 2011, achilles tendon repair 2009     PT Frequency  2x / week    PT Duration  8 weeks    PT Treatment/Interventions  ADLs/Self Care Home Management;Cryotherapy;Electrical Stimulation;Moist Heat;Ultrasound;DME Instruction;Stair training;Functional mobility training;Therapeutic activities;Therapeutic exercise;Manual techniques;Orthotic Fit/Training;Gait training;Patient/family education;Neuromuscular re-education;Balance training;Passive range of motion;Dry needling;Energy conservation;Splinting;Taping    PT Next Visit Plan  reassess HEP    Consulted and Agree with Plan of Care  Patient       Patient will benefit from skilled therapeutic intervention in order to improve the following deficits and impairments:  Decreased  activity tolerance, Decreased strength, Pain, Difficulty walking, Decreased range of motion, Impaired flexibility  Visit Diagnosis: Pain in right hip  Stiffness of right hip, not elsewhere classified  Difficulty in walking, not elsewhere classified  Muscle weakness (generalized)     Problem List Patient Active Problem List   Diagnosis Date Noted  . Localized swelling of both lower extremities 09/10/2017  . Greater trochanteric bursitis of right hip 05/21/2017  . Type 2 diabetes mellitus with hyperglycemia ([place]) 08/21/2016  . Attention deficit disorder 05/29/2016  . Essential hypertension 03/18/2015  . Hypothyroidism 03/18/2015    Janene Harvey, PT, DPT 09/15/17 4:45 PM   [facility] [address]  [place], [place], [address] Phone: [phone]   Fax:  [phone]  Name: Cristina Burns MRN: [id number] Date of Birth: August 25, 1960   PHYSICAL THERAPY DISCHARGE SUMMARY  Visits from Start of Care: 1  Current functional level related to goals / functional outcomes: Unable to assess as patient did not return after inital eval   Remaining deficits: Unable to assess   Education / Equipment: HEP  Plan: Patient agrees to discharge.  Patient goals were not met. Patient is being discharged due to not returning since the last visit.  ?????     Janene Harvey, PT, DPT 10/19/17 2:27 PM

## 2017-09-17 ENCOUNTER — Other Ambulatory Visit (INDEPENDENT_AMBULATORY_CARE_PROVIDER_SITE_OTHER): Payer: PRIVATE HEALTH INSURANCE

## 2017-09-17 DIAGNOSIS — M7989 Other specified soft tissue disorders: Secondary | ICD-10-CM

## 2017-09-18 LAB — BASIC METABOLIC PANEL
BUN: 21 mg/dL (ref 6–23)
CALCIUM: 10.1 mg/dL (ref 8.4–10.5)
CO2: 30 mEq/L (ref 19–32)
CREATININE: 1.2 mg/dL (ref 0.40–1.20)
Chloride: 101 mEq/L (ref 96–112)
GFR: 49.19 mL/min — AB (ref >60.00)
Glucose, Bld: 138 mg/dL — ABNORMAL HIGH (ref 70–99)
Potassium: 4.5 mEq/L (ref 3.5–5.1)
Sodium: 142 mEq/L (ref 135–145)

## 2017-09-23 ENCOUNTER — Other Ambulatory Visit: Payer: Self-pay | Admitting: Family Medicine

## 2017-09-23 DIAGNOSIS — Z794 Long term (current) use of insulin: Secondary | ICD-10-CM

## 2017-09-23 DIAGNOSIS — E1165 Type 2 diabetes mellitus with hyperglycemia: Secondary | ICD-10-CM

## 2017-10-01 NOTE — Telephone Encounter (Signed)
Pt returned [place] call. Pt says that she is averaging 23-30 units of Humalog 3 times a day. Pt says that she is on her last pin.    Okay to call pt back for further concerns.

## 2017-10-02 NOTE — Telephone Encounter (Signed)
Pharmacy informed of correct amount

## 2017-10-04 ENCOUNTER — Encounter: Payer: Self-pay | Admitting: Family Medicine

## 2017-10-04 ENCOUNTER — Other Ambulatory Visit: Payer: Self-pay | Admitting: Family Medicine

## 2017-10-15 ENCOUNTER — Other Ambulatory Visit: Payer: Self-pay | Admitting: Family Medicine

## 2017-10-15 DIAGNOSIS — E1165 Type 2 diabetes mellitus with hyperglycemia: Secondary | ICD-10-CM

## 2017-10-15 DIAGNOSIS — Z794 Long term (current) use of insulin: Secondary | ICD-10-CM

## 2017-10-22 ENCOUNTER — Ambulatory Visit (INDEPENDENT_AMBULATORY_CARE_PROVIDER_SITE_OTHER): Payer: PRIVATE HEALTH INSURANCE | Admitting: Family Medicine

## 2017-10-22 ENCOUNTER — Encounter: Payer: Self-pay | Admitting: Family Medicine

## 2017-10-22 VITALS — BP 130/68 | HR 108 | Temp 98.3°F | Ht 62.0 in | Wt 215.0 lb

## 2017-10-22 DIAGNOSIS — I10 Essential (primary) hypertension: Secondary | ICD-10-CM | POA: Diagnosis not present

## 2017-10-22 DIAGNOSIS — Z23 Encounter for immunization: Secondary | ICD-10-CM | POA: Diagnosis not present

## 2017-10-22 DIAGNOSIS — K219 Gastro-esophageal reflux disease without esophagitis: Secondary | ICD-10-CM

## 2017-10-22 DIAGNOSIS — M25551 Pain in right hip: Secondary | ICD-10-CM

## 2017-10-22 DIAGNOSIS — G2581 Restless legs syndrome: Secondary | ICD-10-CM

## 2017-10-22 DIAGNOSIS — M7989 Other specified soft tissue disorders: Secondary | ICD-10-CM

## 2017-10-22 DIAGNOSIS — F988 Other specified behavioral and emotional disorders with onset usually occurring in childhood and adolescence: Secondary | ICD-10-CM | POA: Diagnosis not present

## 2017-10-22 MED ORDER — METHYLPHENIDATE HCL 20 MG PO TABS: ORAL_TABLET | ORAL | 0 refills | Status: DC

## 2017-10-22 MED ORDER — MELOXICAM 7.5 MG PO TABS: 7.5000 mg | ORAL_TABLET | Freq: Every day | ORAL | 0 refills | Status: DC

## 2017-10-22 MED ORDER — FUROSEMIDE 40 MG PO TABS: 40.0000 mg | ORAL_TABLET | Freq: Two times a day (BID) | ORAL | 3 refills | Status: DC

## 2017-10-22 MED ORDER — ROPINIROLE HCL 2 MG PO TABS: 2.0000 mg | ORAL_TABLET | Freq: Every day | ORAL | 5 refills | Status: DC

## 2017-10-22 NOTE — Progress Notes (Signed)
Chief Complaint  Patient presents with  . Follow-up    Subjective: Patient is a 57 y.o. female here for LE swelling.  Started on Lasix. Working with massage there also that is helpful. 15-20 min before it starts working. Lasts around 4 hrs. She notices good benefit and would like to take in afternoon also. No AE's. While she is not on supp K, her K is on higher end of normal and she is also on a K sparing diuretic. No cp or sob.  Hx of RLS on Requip. Somewhat helpful, takes at night. No AE's. Interested in increasing the dose.  Hx of GERD, currently on Protonix bid. Interested in losing weight as she knows this will help. Not interested in seeing a specialist at this time.   ADHD, currently on Ritalin LA 20 mg/d and 10 mg short acting in afternoon. Tolerating well overall, but feels her dose is insufficient in the afternoon. Feels AM dose is sufficient. No facial tics, palpitations, unintentional wt loss, insomnia.   Hx of hip, back and leg pain. Went to PT, but stopped after 1 session due to time constraints. Has failed injections x2. Knows wt loss will aid with this. Nsaids have been helpful in the past.   Hx of HTN, compliant with meds. Diet is poor, does not exercise.   Interested in ZOX09 as she is now certain that her insurance will cover it. Already had flu shot. Hx of DM.   ROS: 10 pt ros neg unless otherwise stated in HPI  Past Medical History:  Diagnosis Date  . ADHD (attention deficit hyperactivity disorder)   . Allergic rhinitis   . Allergy   . Diabetes mellitus without complication ([place])   . Diabetic neuropathy ([place])   . GERD (gastroesophageal reflux disease)   . Hypertension   . Hypothyroidism   . RLS (restless legs syndrome)   . Stomach ulcer "it's been a long time"    Objective: BP 130/68 (BP Location: Left Arm, Patient Position: Sitting, Cuff Size: Large)   Pulse (!) 108   Temp 98.3 F (36.8 C) (Oral)   Ht 5\' 2"  (1.575 m)   Wt 215 lb (97.5 kg)   SpO2  95%   BMI 39.32 kg/m  General: Awake, appears stated age HEENT: MMM, EOMi Heart: RRR (HR around 84), 1+ pitting edema up to knees b/l- much improved from last visit, no bruits Abd: BS+, soft, NT, ND, no masses or organomegaly Neuro: No facial tics, no cerebellar signs Lungs: CTAB, no rales, wheezes or rhonchi. No accessory muscle use Psych: Age appropriate judgment and insight, flat affect and normal mood  Assessment and Plan: Localized swelling of both lower extremities - Plan: furosemide (LASIX) 40 MG tablet  Greater trochanteric pain syndrome of right lower extremity - Plan: meloxicam (MOBIC) 7.5 MG tablet  Attention deficit disorder, unspecified hyperactivity presence - Plan: rOPINIRole (REQUIP) 2 MG tablet  Essential hypertension  RLS (restless legs syndrome) - Plan: meloxicam (MOBIC) 7.5 MG tablet  Gastroesophageal reflux disease, esophagitis presence not specified  Need for influenza vaccination - Plan: Flu Vaccine QUAD 6+ mos PF IM (Fluarix Quad PF)  Need for vaccination against Streptococcus pneumoniae - Plan: Pneumococcal polysaccharide vaccine 23-valent greater than or equal to 2yo subcutaneous/IM  Orders as above. Increase to twice daily dosing. Lower concern for HypoK given hx of K readings and on K sparing diuretic. Encouraged elevation, compression, mind salt and activity. Offered referral to sports med vs ortho, but she would like to try to  lose weight first. Low dose Mobic to help with pain and not affect her stomach as much. Keep LA Ritalin, increase dose of short acting in afternoon. Maintain BP regimen.  Increase dose of Requip. Counseled on reflux precautions. Keep Protonix the same. If no improvement, will refer to GI. Imms updated. F/u in 1 mo.  The patient voiced understanding and agreement to the plan.  [place], DO 10/22/17  7:46 PM

## 2017-10-22 NOTE — Patient Instructions (Signed)
Continue taking Lasix, just twice daily.  Take the new dose of your Requip for the RLS.  The only lifestyle changes that have data behind them are weight loss for the overweight/obese and elevating the head of the bed. Finding out which foods/positions are triggers is important.  Take 2 tabs of the afternoon dose until you run out. Start the new dose when you run out.  Let me know if you want to see a specialist for your hip/back/legs.  Let us know if you need anything.

## 2017-10-22 NOTE — Progress Notes (Signed)
Pre visit review using our clinic review tool, if applicable. No additional management support is needed unless otherwise documented below in the visit note. 

## 2017-11-19 ENCOUNTER — Other Ambulatory Visit: Payer: Self-pay | Admitting: Family Medicine

## 2017-11-19 DIAGNOSIS — M25551 Pain in right hip: Secondary | ICD-10-CM

## 2017-11-19 DIAGNOSIS — G2581 Restless legs syndrome: Secondary | ICD-10-CM

## 2017-11-20 ENCOUNTER — Encounter: Payer: Self-pay | Admitting: Family Medicine

## 2017-11-20 DIAGNOSIS — F988 Other specified behavioral and emotional disorders with onset usually occurring in childhood and adolescence: Secondary | ICD-10-CM

## 2017-11-20 MED ORDER — MELOXICAM 15 MG PO TABS: 15.0000 mg | ORAL_TABLET | Freq: Every day | ORAL | 0 refills | Status: DC

## 2017-11-20 MED ORDER — METHYLPHENIDATE HCL ER (LA) 20 MG PO CP24: 20.0000 mg | ORAL_CAPSULE | ORAL | 0 refills | Status: DC

## 2017-11-20 MED ORDER — METHYLPHENIDATE HCL 20 MG PO TABS: ORAL_TABLET | ORAL | 0 refills | Status: DC

## 2017-11-23 ENCOUNTER — Telehealth: Payer: Self-pay

## 2017-11-23 NOTE — Telephone Encounter (Signed)
PA approved.   Request Reference Number: [id number]. METHYLPHENID CAP 20MG  ER is approved through 11/24/2018. For further questions, call [phone].

## 2017-11-23 NOTE — Telephone Encounter (Signed)
PA initiated via Covermymeds; KEY: [id number]. Awaiting determination.

## 2017-11-26 ENCOUNTER — Encounter: Payer: Self-pay | Admitting: Family Medicine

## 2017-11-26 ENCOUNTER — Ambulatory Visit (INDEPENDENT_AMBULATORY_CARE_PROVIDER_SITE_OTHER): Payer: PRIVATE HEALTH INSURANCE | Admitting: Family Medicine

## 2017-11-26 VITALS — BP 130/72 | HR 116 | Temp 98.5°F | Ht 62.0 in | Wt 215.2 lb

## 2017-11-26 DIAGNOSIS — Z1239 Encounter for other screening for malignant neoplasm of breast: Secondary | ICD-10-CM | POA: Diagnosis not present

## 2017-11-26 DIAGNOSIS — K219 Gastro-esophageal reflux disease without esophagitis: Secondary | ICD-10-CM | POA: Diagnosis not present

## 2017-11-26 DIAGNOSIS — F988 Other specified behavioral and emotional disorders with onset usually occurring in childhood and adolescence: Secondary | ICD-10-CM | POA: Diagnosis not present

## 2017-11-26 DIAGNOSIS — R635 Abnormal weight gain: Secondary | ICD-10-CM | POA: Diagnosis not present

## 2017-11-26 DIAGNOSIS — G2581 Restless legs syndrome: Secondary | ICD-10-CM

## 2017-11-26 MED ORDER — ROPINIROLE HCL 2 MG PO TABS: ORAL_TABLET | ORAL | 5 refills | Status: DC

## 2017-11-26 NOTE — Progress Notes (Signed)
Chief Complaint  Patient presents with  . Follow-up    Cristina Burns is 57 y.o. female here for ADHD follow up.  Her afternoon dose was recently increased.  Due to insurance coverage, she has not been able to pick this up yet.  No side effects with current medication dose.  She is also following up for lower extreme the swelling.  She believes the Lasix is helping, they are not bad today.  They seem to wax and wane.  Following up for increased dose of Requip.  She finds if he takes it twice nightly, it helps much more.  She is tolerating the new dose well.  She is still taking Protonix for her reflux.  She tried adhering to the reflux precautions that we discussed but is not noticing any difference.  She would like to have her thyroid checked and if normal, she is willing to see a gastroenterologist.  ROS:  Heart- denies chest pain or palpitations Psych- as noted in HPI  Past Medical History:  Diagnosis Date  . ADHD (attention deficit hyperactivity disorder)   . Allergic rhinitis   . Allergy   . Diabetes mellitus without complication ([place])   . Diabetic neuropathy ([place])   . GERD (gastroesophageal reflux disease)   . Hypertension   . Hypothyroidism   . RLS (restless legs syndrome)   . Stomach ulcer "it's been a long time"    BP 130/72 (BP Location: Left Arm, Patient Position: Sitting, Cuff Size: Large)   Pulse (!) 116   Temp 98.5 F (36.9 C) (Oral)   Ht 5\' 2"  (1.575 m)   Wt 215 lb 4 oz (97.6 kg)   SpO2 97%   BMI 39.37 kg/m  Gen- awake, alert, appearing stated age 62- PERRLA, MMM Heart- RRR, 2+ pitting lower extremity edema bilaterally up to proximal third of tibia, no bruits Lungs- CTAB, no accessory muscle use Abd- soft, NT, ND, no masses or organomegaly Neuro- no facial tics Psych- age appropriate judgment and insight, normal mood and affect  Attention deficit disorder, unspecified hyperactivity presence  Gastroesophageal reflux disease, esophagitis presence not  specified  Weight gain - Plan: TSH, T4, free  Screening for malignant neoplasm of breast - Plan: MM DIGITAL SCREENING BILATERAL  RLS (restless legs syndrome) - Plan: rOPINIRole (REQUIP) 2 MG tablet  #1-We will see how she does as the PA was just approved. #2-if the thyroid level is normal, she is agreeable to see a gastroenterologist. #3-check thyroid level #4-order mammogram #5-increase Requip to twice daily, continue current dosage F/u in 1 month for a physical.  She is checking with her insurance to make sure it is a physical per calendar year rather than 1 per every 365 days. Pt voiced understanding and agreement to the plan.  [place], DO 11/26/17 4:34 PM

## 2017-11-26 NOTE — Progress Notes (Signed)
Pre visit review using our clinic review tool, if applicable. No additional management support is needed unless otherwise documented below in the visit note. 

## 2017-11-26 NOTE — Patient Instructions (Addendum)
Double check to make sure you get 1 physical covered per calendar year. If so, keep appt.   Keep the diet clean and stay active.  If thyroid is normal, we will send you to a specialist for your reflux.   Let us know if you need anything.

## 2017-11-27 ENCOUNTER — Other Ambulatory Visit: Payer: Self-pay | Admitting: Family Medicine

## 2017-11-27 DIAGNOSIS — K219 Gastro-esophageal reflux disease without esophagitis: Secondary | ICD-10-CM

## 2017-11-27 LAB — TSH: TSH: 0.52 u[IU]/mL (ref 0.35–4.50)

## 2017-11-27 LAB — T4, FREE: FREE T4: 1.32 ng/dL (ref 0.60–1.60)

## 2017-11-27 NOTE — Progress Notes (Signed)
Referral to GI placed

## 2017-12-01 ENCOUNTER — Encounter: Payer: Self-pay | Admitting: Family Medicine

## 2017-12-01 ENCOUNTER — Other Ambulatory Visit: Payer: Self-pay | Admitting: Family Medicine

## 2017-12-01 MED ORDER — LOSARTAN POTASSIUM 25 MG PO TABS: 25.0000 mg | ORAL_TABLET | Freq: Every day | ORAL | 1 refills | Status: DC

## 2017-12-01 MED ORDER — MONTELUKAST SODIUM 10 MG PO TABS: 10.0000 mg | ORAL_TABLET | Freq: Every day | ORAL | 3 refills | Status: DC

## 2017-12-07 ENCOUNTER — Telehealth: Payer: Self-pay

## 2017-12-07 NOTE — Telephone Encounter (Signed)
PA approved.  Request Reference Number: [id number]. Pantoprazole Tab 40mg  is approved through 12/08/2018. For further questions, call [phone].

## 2017-12-07 NOTE — Telephone Encounter (Signed)
PA initiated via Covermymeds; KEY: [id number]. Awaiting determination.

## 2017-12-22 ENCOUNTER — Encounter: Payer: Self-pay | Admitting: Family Medicine

## 2017-12-22 DIAGNOSIS — Z794 Long term (current) use of insulin: Secondary | ICD-10-CM

## 2017-12-22 DIAGNOSIS — E1165 Type 2 diabetes mellitus with hyperglycemia: Secondary | ICD-10-CM

## 2017-12-22 MED ORDER — METFORMIN HCL ER 500 MG PO TB24: 1000.0000 mg | ORAL_TABLET | Freq: Two times a day (BID) | ORAL | 1 refills | Status: DC

## 2017-12-28 ENCOUNTER — Encounter: Payer: Self-pay | Admitting: Family Medicine

## 2017-12-28 ENCOUNTER — Ambulatory Visit (INDEPENDENT_AMBULATORY_CARE_PROVIDER_SITE_OTHER): Payer: PRIVATE HEALTH INSURANCE | Admitting: Family Medicine

## 2017-12-28 VITALS — BP 118/82 | HR 107 | Temp 98.9°F | Ht 62.0 in | Wt 214.1 lb

## 2017-12-28 DIAGNOSIS — Z23 Encounter for immunization: Secondary | ICD-10-CM

## 2017-12-28 DIAGNOSIS — E1165 Type 2 diabetes mellitus with hyperglycemia: Secondary | ICD-10-CM

## 2017-12-28 DIAGNOSIS — Z0000 Encounter for general adult medical examination without abnormal findings: Secondary | ICD-10-CM

## 2017-12-28 DIAGNOSIS — Z794 Long term (current) use of insulin: Secondary | ICD-10-CM | POA: Diagnosis not present

## 2017-12-28 NOTE — Progress Notes (Signed)
Pre visit review using our clinic review tool, if applicable. No additional management support is needed unless otherwise documented below in the visit note. 

## 2017-12-28 NOTE — Progress Notes (Signed)
Chief Complaint  Patient presents with  . Annual Exam     Well Woman Cristina Burns is here for a complete physical.   Her last physical was >1 year ago.  Current diet: in general, an OK diet. Current exercise: none. Weight is stable and she denies daytime fatigue. No LMP recorded. Patient has had a hysterectomy. Seatbelt? Yes  Health Maintenance Mammogram- Yes Tetanus- No - refuses Hep C screening- Yes HIV screening- Yes  Past Medical History:  Diagnosis Date  . ADHD (attention deficit hyperactivity disorder)   . Allergic rhinitis   . Allergy   . Diabetes mellitus without complication ([place])   . Diabetic neuropathy ([place])   . GERD (gastroesophageal reflux disease)   . Hypertension   . Hypothyroidism   . RLS (restless legs syndrome)   . Stomach ulcer "it's been a long time"     Past Surgical History:  Procedure Laterality Date  . ABDOMINAL HYSTERECTOMY  2002  . ACHILLES TENDON REPAIR  2009  . CHOLECYSTECTOMY  1982  . ROTATOR CUFF REPAIR Right 2011    Medications  Current Outpatient Medications on File Prior to Visit  Medication Sig Dispense Refill  . amLODipine (NORVASC) 5 MG tablet Take 1 tablet (5 mg total) by mouth daily. 90 tablet 1  . atorvastatin (LIPITOR) 40 MG tablet Take 1 tablet (40 mg total) by mouth daily. 30 tablet 5  . cetirizine (ZYRTEC) 10 MG tablet Take 10 mg by mouth daily.    . cholecalciferol (VITAMIN D) 1000 units tablet Take 1 tablet (1,000 Units total) by mouth daily. 30 tablet 0  . FREESTYLE LITE test strip USE TO TEST BLOOD GLUCOSE FOUR TIMES DAILY DX: E11.42 200 each 3  . furosemide (LASIX) 40 MG tablet Take 1 tablet (40 mg total) by mouth 2 (two) times daily. 60 tablet 3  . gabapentin (NEURONTIN) 600 MG tablet Take 1 tablet (600 mg total) by mouth 3 (three) times daily. 270 tablet 0  . glimepiride (AMARYL) 4 MG tablet TAKE 1 TABLET BY MOUTH TWICE DAILY BEFORE MEAL(S) 60 tablet 5  . Insulin Glargine (TOUJEO SOLOSTAR) 300 UNIT/ML SOPN  Inject 75 Units into the skin 2 (two) times daily. 15 pen 3  . insulin lispro (HUMALOG KWIKPEN) 100 UNIT/ML KiwkPen INJECT 10 UNITS SUBCUTANEOUSLY THREE TIMES DAILY WITH MEALS PLUS SLIDING SCALE 1 UNIT PER 8 POINTS of sugar 15 mL 2  . insulin lispro (HUMALOG) 100 UNIT/ML KiwkPen 5 units with every meal plus sliding scale as detailed in your AVS. 15 mL 2  . levothyroxine (SYNTHROID, LEVOTHROID) 150 MCG tablet Take 1 tablet (150 mcg total) by mouth daily before breakfast. 90 tablet 0  . loratadine (CLARITIN) 10 MG tablet Take 10 mg by mouth daily.    [place] Kitchen losartan (COZAAR) 25 MG tablet TAKE 1 TABLET BY MOUTH ONCE DAILY 45 tablet 0  . losartan (COZAAR) 25 MG tablet Take 1 tablet (25 mg total) by mouth daily. 90 tablet 1  . meloxicam (MOBIC) 15 MG tablet Take 1 tablet (15 mg total) by mouth daily. 30 tablet 0  . metFORMIN (GLUCOPHAGE-XR) 500 MG 24 hr tablet Take 2 tablets (1,000 mg total) by mouth 2 (two) times daily. 360 tablet 1  . methylphenidate (RITALIN LA) 20 MG 24 hr capsule Take 1 capsule (20 mg total) by mouth every morning. 30 capsule 0  . methylphenidate (RITALIN LA) 20 MG 24 hr capsule Take 1 capsule (20 mg total) by mouth every morning. 30 capsule 0  . [START  ON 01/19/2018] methylphenidate (RITALIN LA) 20 MG 24 hr capsule Take 1 capsule (20 mg total) by mouth every morning. 30 capsule 0  . methylphenidate (RITALIN) 20 MG tablet Daily after lunch. 30 tablet 0  . methylphenidate (RITALIN) 20 MG tablet Daily after lunch. 30 tablet 0  . [START ON 01/19/2018] methylphenidate (RITALIN) 20 MG tablet Take daily after lunch. 30 tablet 0  . montelukast (SINGULAIR) 10 MG tablet TAKE 1 TABLET BY MOUTH AT BEDTIME 30 tablet 3  . montelukast (SINGULAIR) 10 MG tablet Take 1 tablet (10 mg total) by mouth at bedtime. 90 tablet 3  . multivitamin-iron-minerals-folic acid (CENTRUM) chewable tablet Chew 1 tablet by mouth daily.    . pantoprazole (PROTONIX) 40 MG tablet Take 1 tablet (40 mg total) by mouth 2 (two)  times daily. 180 tablet 1  . rOPINIRole (REQUIP) 2 MG tablet Take 1 tab mid afternoon and one tab before bed. 60 tablet 5  . TOUJEO SOLOSTAR 300 UNIT/ML SOPN INJECT 68 UNITS SUBCUTANEOUSLY TWICE DAILY 18 mL 3  . triamcinolone (NASACORT ALLERGY 24HR) 55 MCG/ACT AERO nasal inhaler Place 2 sprays into the nose daily. 1 Inhaler 2  . triamterene-hydrochlorothiazide (MAXZIDE-25) 37.5-25 MG tablet Take 1 tablet by mouth daily. 90 tablet 0   Allergies Allergies  Allergen Reactions  . Codeine Hives  . Darvon [Propoxyphene] Hives    "Darvocet"  . Doxycycline     Double Vision  . Erythromycin     Stomach pain  . Keflex [Cephalexin] Hives    Review of Systems: Constitutional:  no unexpected weight changes Eye:  no recent significant change in vision Ear/Nose/Mouth/Throat:  Ears:  no tinnitus or vertigo and no recent change in hearing Nose/Mouth/Throat:  no complaints of nasal congestion, no sore throat Cardiovascular: no chest pain Respiratory:  no cough and no shortness of breath Gastrointestinal:  no abdominal pain, no change in bowel habits GU:  Female: negative for dysuria or pelvic pain Musculoskeletal/Extremities:  no pain of the joints Integumentary (Skin/Breast): + Skin lesion on scalp; otherwise no abnormal skin lesions reported Neurologic:  no headaches Endocrine:  denies fatigue Hematologic/Lymphatic:  No areas of easy bleeding  Exam BP 118/82 (BP Location: Left Arm, Patient Position: Sitting, Cuff Size: Large)   Pulse (!) 107   Temp 98.9 F (37.2 C) (Oral)   Ht 5\' 2"  (1.575 m)   Wt 214 lb 2 oz (97.1 kg)   SpO2 97%   BMI 39.16 kg/m  General:  well developed, well nourished, in no apparent distress Skin: See below; otherwise no significant moles, warts, or growths Head:  no masses, lesions, or tenderness Eyes:  pupils equal and round, sclera anicteric without injection Ears:  canals without lesions, TMs shiny without retraction, no obvious effusion, no erythema Nose:   nares patent, septum midline, mucosa normal, and no drainage or sinus tenderness Throat/Pharynx:  lips and gingiva without lesion; tongue and uvula midline; non-inflamed pharynx; no exudates or postnasal drainage Neck: neck supple without adenopathy, thyromegaly, or masses Lungs:  clear to auscultation, breath sounds equal bilaterally, no respiratory distress Cardio:  regular rate and rhythm, no bruits, 2+ pitting bilateral LE edema Abdomen:  abdomen soft, nontender; bowel sounds normal; no masses or organomegaly Genital: Defer to GYN Musculoskeletal:  symmetrical muscle groups noted without atrophy or deformity Extremities:  no clubbing, cyanosis, or edema, no deformities, no skin discoloration Neuro:  gait normal; deep tendon reflexes normal and symmetric; sensation of bilateral feet intact to pinprick Psych: well oriented with normal range of  affect and appropriate judgment/insight   Right anterior scalp   Assessment and Plan  Well adult exam  Type 2 diabetes mellitus with hyperglycemia, with long-term current use of insulin ([place]) - Plan: HM DIABETES FOOT EXAM  Need for tetanus booster - Plan: Tdap vaccine greater than or equal to 15yo IM   Well 57 y.o. female. Counseled on diet and exercise. Monitor skin lesion, looks like inflamed seborrheic keratosis.  If anything changes, she is to let us know.  I did offer to shave it or send her to dermatology. Other orders as above. Follow up 3 months. The patient voiced understanding and agreement to the plan.  [place], DO 12/28/17 3:13 PM

## 2017-12-28 NOTE — Patient Instructions (Addendum)
Keep the diet clean and stay active.  Send me a message after you have your eye exam.   Let us know if you need anything.

## 2017-12-29 LAB — HM DIABETES EYE EXAM

## 2017-12-30 ENCOUNTER — Encounter: Payer: Self-pay | Admitting: Family Medicine

## 2017-12-30 MED ORDER — GABAPENTIN 600 MG PO TABS: 600.0000 mg | ORAL_TABLET | Freq: Three times a day (TID) | ORAL | 2 refills | Status: DC

## 2018-01-14 ENCOUNTER — Other Ambulatory Visit: Payer: Self-pay | Admitting: Family Medicine

## 2018-01-14 DIAGNOSIS — E1165 Type 2 diabetes mellitus with hyperglycemia: Secondary | ICD-10-CM

## 2018-01-14 DIAGNOSIS — Z794 Long term (current) use of insulin: Secondary | ICD-10-CM

## 2018-01-15 ENCOUNTER — Other Ambulatory Visit: Payer: Self-pay | Admitting: Family Medicine

## 2018-01-15 MED ORDER — INSULIN LISPRO (1 UNIT DIAL) 100 UNIT/ML (KWIKPEN): PEN_INJECTOR | SUBCUTANEOUS | 3 refills | Status: DC

## 2018-01-26 ENCOUNTER — Encounter: Payer: Self-pay | Admitting: Family Medicine

## 2018-01-26 DIAGNOSIS — E1165 Type 2 diabetes mellitus with hyperglycemia: Secondary | ICD-10-CM

## 2018-01-26 DIAGNOSIS — Z794 Long term (current) use of insulin: Secondary | ICD-10-CM

## 2018-01-27 ENCOUNTER — Other Ambulatory Visit: Payer: Self-pay | Admitting: Family Medicine

## 2018-01-27 DIAGNOSIS — Z794 Long term (current) use of insulin: Secondary | ICD-10-CM

## 2018-01-27 DIAGNOSIS — E1165 Type 2 diabetes mellitus with hyperglycemia: Secondary | ICD-10-CM

## 2018-01-27 MED ORDER — INSULIN GLARGINE 300 UNIT/ML ~~LOC~~ SOPN: 75.0000 [IU] | PEN_INJECTOR | Freq: Two times a day (BID) | SUBCUTANEOUS | 3 refills | Status: DC

## 2018-02-03 ENCOUNTER — Encounter: Payer: Self-pay | Admitting: Family Medicine

## 2018-02-03 ENCOUNTER — Other Ambulatory Visit: Payer: Self-pay | Admitting: Family Medicine

## 2018-02-03 MED ORDER — ATORVASTATIN CALCIUM 40 MG PO TABS: 40.0000 mg | ORAL_TABLET | Freq: Every day | ORAL | 2 refills | Status: DC

## 2018-02-15 ENCOUNTER — Other Ambulatory Visit: Payer: Self-pay | Admitting: Family Medicine

## 2018-02-15 MED ORDER — LEVOTHYROXINE SODIUM 150 MCG PO TABS: 150.0000 ug | ORAL_TABLET | Freq: Every day | ORAL | 0 refills | Status: DC

## 2018-02-18 ENCOUNTER — Other Ambulatory Visit: Payer: Self-pay | Admitting: Family Medicine

## 2018-02-18 DIAGNOSIS — F988 Other specified behavioral and emotional disorders with onset usually occurring in childhood and adolescence: Secondary | ICD-10-CM

## 2018-02-18 MED ORDER — METHYLPHENIDATE HCL ER (LA) 20 MG PO CP24: 20.0000 mg | ORAL_CAPSULE | ORAL | 0 refills | Status: DC

## 2018-02-18 MED ORDER — METHYLPHENIDATE HCL 20 MG PO TABS: ORAL_TABLET | ORAL | 0 refills | Status: DC

## 2018-03-03 ENCOUNTER — Encounter: Payer: Self-pay | Admitting: Family Medicine

## 2018-03-09 ENCOUNTER — Other Ambulatory Visit: Payer: Self-pay | Admitting: Family Medicine

## 2018-03-09 MED ORDER — AMLODIPINE BESYLATE 5 MG PO TABS: 5.0000 mg | ORAL_TABLET | Freq: Every day | ORAL | 1 refills | Status: DC

## 2018-03-09 MED ORDER — TRIAMTERENE-HCTZ 37.5-25 MG PO TABS: 1.0000 | ORAL_TABLET | Freq: Every day | ORAL | 1 refills | Status: DC

## 2018-03-15 ENCOUNTER — Other Ambulatory Visit: Payer: Self-pay | Admitting: Family Medicine

## 2018-04-01 ENCOUNTER — Other Ambulatory Visit: Payer: Self-pay

## 2018-04-01 ENCOUNTER — Encounter: Payer: Self-pay | Admitting: Family Medicine

## 2018-04-01 ENCOUNTER — Ambulatory Visit (INDEPENDENT_AMBULATORY_CARE_PROVIDER_SITE_OTHER): Payer: PRIVATE HEALTH INSURANCE | Admitting: Family Medicine

## 2018-04-01 VITALS — BP 122/72 | HR 102 | Temp 98.8°F | Ht 62.0 in | Wt 211.1 lb

## 2018-04-01 DIAGNOSIS — M25551 Pain in right hip: Secondary | ICD-10-CM

## 2018-04-01 DIAGNOSIS — H9191 Unspecified hearing loss, right ear: Secondary | ICD-10-CM | POA: Diagnosis not present

## 2018-04-01 MED ORDER — PREDNISONE 20 MG PO TABS: 40.0000 mg | ORAL_TABLET | Freq: Every day | ORAL | 0 refills | Status: AC

## 2018-04-01 NOTE — Progress Notes (Signed)
Chief Complaint  Patient presents with  . Follow-up    Subjective  Patient is a 58 y.o. female here for hip f/u and ear fullness.  Over the past couple days, she has been having fullness/muffled hearing in her right ear.  She does have a history of wax without.  She does not use Q-tips.  No recent injury to the ear or drainage.  She is not having any pain and denies any fevers or recent upper respiratory infections.  She has a history of right hip pain that was refractory to injections.  Home stretches and exercises were helpful but she has not been doing them routinely.  No recent injury or change in activity.  ROS: HEENT: +hearing loss on R  Past Medical History:  Diagnosis Date  . ADHD (attention deficit hyperactivity disorder)   . Allergic rhinitis   . Allergy   . Diabetes mellitus without complication ([place])   . Diabetic neuropathy ([place])   . GERD (gastroesophageal reflux disease)   . Hypertension   . Hypothyroidism   . RLS (restless legs syndrome)   . Stomach ulcer "it's been a long time"    Objective: BP 122/72 (BP Location: Left Arm, Patient Position: Sitting, Cuff Size: Normal)   Pulse (!) 102   Temp 98.8 F (37.1 C) (Oral)   Ht 5\' 2"  (1.575 m)   Wt 211 lb 2 oz (95.8 kg)   SpO2 94%   BMI 38.62 kg/m  General: Awake, appears stated age HEENT: MMM, EOMi, tympanic membranes with sclerosis bilaterally, no effusion, canals are patent bilaterally Heart: RRR Lungs: CTAB, no rales, wheezes or rhonchi. No accessory muscle use Psych: Age appropriate judgment and insight, normal affect and mood  Assessment and Plan: Hearing loss of right ear, unspecified hearing loss type - Plan: predniSONE (DELTASONE) 20 MG tablet  Right hip pain  Prednisone burst for possible eustachian tube dysfunction contributing to hearing loss.  If no improvement, will need an audiology appointment.  She does not currently have insurance she is tells me.  Do the stretches and exercises for the hip  pain.  If no improvement, will consider referral versus physical therapy. Follow-up when she gets insurance coverage. The patient voiced understanding and agreement to the plan.  [place], DO 04/01/18  4:47 PM

## 2018-04-01 NOTE — Patient Instructions (Signed)
If no improvement in hearing, I would consider seeing an audiologist/ENT provider. This may have to wait until you get insurance coverage.   Keep the diet clean and stay active.  Go back to doing the stretches/exercises for the hip.   Ice/cold pack over area for 10-15 min twice daily.  Let us know if you need anything.

## 2018-04-06 ENCOUNTER — Encounter: Payer: Self-pay | Admitting: Family Medicine

## 2018-04-13 ENCOUNTER — Encounter: Payer: Self-pay | Admitting: Family Medicine

## 2018-04-19 ENCOUNTER — Encounter: Payer: Self-pay | Admitting: Family Medicine

## 2018-04-20 ENCOUNTER — Encounter: Payer: Self-pay | Admitting: Family Medicine

## 2018-04-20 DIAGNOSIS — Z794 Long term (current) use of insulin: Secondary | ICD-10-CM

## 2018-04-20 DIAGNOSIS — E1165 Type 2 diabetes mellitus with hyperglycemia: Secondary | ICD-10-CM

## 2018-04-21 MED ORDER — METFORMIN HCL ER 500 MG PO TB24: 1000.0000 mg | ORAL_TABLET | Freq: Two times a day (BID) | ORAL | 1 refills | Status: DC

## 2018-04-21 MED ORDER — GLIMEPIRIDE 4 MG PO TABS: ORAL_TABLET | ORAL | 5 refills | Status: DC

## 2018-04-30 ENCOUNTER — Encounter: Payer: Self-pay | Admitting: Family Medicine

## 2018-05-03 MED ORDER — LOSARTAN POTASSIUM 25 MG PO TABS: 25.0000 mg | ORAL_TABLET | Freq: Every day | ORAL | 1 refills | Status: DC

## 2018-05-14 ENCOUNTER — Other Ambulatory Visit: Payer: Self-pay | Admitting: Family Medicine

## 2018-05-16 ENCOUNTER — Other Ambulatory Visit: Payer: Self-pay | Admitting: Family Medicine

## 2018-05-16 ENCOUNTER — Encounter: Payer: Self-pay | Admitting: Family Medicine

## 2018-05-17 ENCOUNTER — Other Ambulatory Visit: Payer: Self-pay | Admitting: Family Medicine

## 2018-05-17 MED ORDER — INSULIN LISPRO (1 UNIT DIAL) 100 UNIT/ML (KWIKPEN): PEN_INJECTOR | SUBCUTANEOUS | 0 refills | Status: DC

## 2018-05-17 MED ORDER — LEVOTHYROXINE SODIUM 150 MCG PO TABS: 150.0000 ug | ORAL_TABLET | Freq: Every day | ORAL | 0 refills | Status: DC

## 2018-05-20 ENCOUNTER — Encounter: Payer: Self-pay | Admitting: Family Medicine

## 2018-05-24 ENCOUNTER — Other Ambulatory Visit: Payer: Self-pay | Admitting: Family Medicine

## 2018-05-24 MED ORDER — INSULIN LISPRO (1 UNIT DIAL) 100 UNIT/ML (KWIKPEN): PEN_INJECTOR | SUBCUTANEOUS | 0 refills | Status: DC

## 2018-05-25 ENCOUNTER — Other Ambulatory Visit: Payer: Self-pay | Admitting: Family Medicine

## 2018-05-25 ENCOUNTER — Telehealth: Payer: Self-pay | Admitting: Family Medicine

## 2018-05-25 MED ORDER — INSULIN NPH ISOPHANE & REGULAR (70-30) 100 UNIT/ML ~~LOC~~ SUSP: 50.0000 [IU] | Freq: Two times a day (BID) | SUBCUTANEOUS | 11 refills | Status: DC

## 2018-05-25 NOTE — Telephone Encounter (Signed)
Pharmacist informed of instructions.

## 2018-05-25 NOTE — Progress Notes (Signed)
Please send in needles/syringes if she needs them. Ty.

## 2018-05-25 NOTE — Telephone Encounter (Signed)
Copied from [place] [id number]. Topic: General - Other >> May 25, 2018  2:43 PM Celene Kras A wrote: Reason for CRM: Pharmacy called requesting to know the maxium dosage pt will be using for the insulin NPH-regular Human (NOVOLIN 70/30 RELION) (70-30) 100 UNIT/ML injection. Pharmacist states she needs to know in order to fill it today. Please advise.  [facility] [address] [place], [place] - [address] [place] [address] Phone: [phone] Fax: [phone] Not a 24 hour pharmacy; exact hours not known.

## 2018-05-25 NOTE — Telephone Encounter (Signed)
Please advise 

## 2018-05-25 NOTE — Telephone Encounter (Signed)
I don't know the max right nowbecause we are changing her from [place]. For now, no more than 75 u bid.

## 2018-06-04 NOTE — Telephone Encounter (Signed)
Pt wants to have the insulin changed to the pens.  She says she does not know anything about vials.  Also she needs to know if this replace tejao and humalog. Please call to confirm  cb is [phone]

## 2018-06-07 NOTE — Telephone Encounter (Signed)
Pt called and stated that she needs a pens sent instead of vials. Please advise

## 2018-06-07 NOTE — Telephone Encounter (Signed)
OK to change. TY .

## 2018-06-08 MED ORDER — INSULIN ISOPHANE & REGULAR (HUMAN 70-30)100 UNIT/ML KWIKPEN: PEN_INJECTOR | SUBCUTANEOUS | 3 refills | Status: DC

## 2018-06-08 MED ORDER — INSULIN PEN NEEDLE 31G X 8 MM MISC: 3 refills | Status: DC

## 2018-06-08 NOTE — Addendum Note (Signed)
Addended by: Sharon Seller B on: 06/08/2018 08:30 AM   Modules accepted: Orders

## 2018-06-08 NOTE — Telephone Encounter (Signed)
Called the patient informed of change

## 2018-06-08 NOTE — Telephone Encounter (Signed)
Changed and PCP aware

## 2018-06-19 ENCOUNTER — Encounter: Payer: Self-pay | Admitting: Family Medicine

## 2018-06-20 ENCOUNTER — Other Ambulatory Visit: Payer: Self-pay | Admitting: Family Medicine

## 2018-06-20 DIAGNOSIS — G2581 Restless legs syndrome: Secondary | ICD-10-CM

## 2018-07-01 ENCOUNTER — Encounter: Payer: Self-pay | Admitting: Family Medicine

## 2018-07-16 ENCOUNTER — Other Ambulatory Visit: Payer: Self-pay | Admitting: Family Medicine

## 2018-07-16 DIAGNOSIS — G2581 Restless legs syndrome: Secondary | ICD-10-CM

## 2018-07-28 ENCOUNTER — Encounter: Payer: Self-pay | Admitting: Family Medicine

## 2018-07-28 ENCOUNTER — Other Ambulatory Visit: Payer: Self-pay | Admitting: Family Medicine

## 2018-07-29 ENCOUNTER — Other Ambulatory Visit: Payer: Self-pay | Admitting: Family Medicine

## 2018-07-29 MED ORDER — NOVOLIN 70/30 FLEXPEN RELION (70-30) 100 UNIT/ML ~~LOC~~ SUPN: PEN_INJECTOR | SUBCUTANEOUS | 3 refills | Status: DC

## 2018-07-31 ENCOUNTER — Encounter: Payer: Self-pay | Admitting: Family Medicine

## 2018-08-02 ENCOUNTER — Other Ambulatory Visit: Payer: Self-pay | Admitting: Family Medicine

## 2018-08-02 MED ORDER — LOSARTAN POTASSIUM 25 MG PO TABS: 25.0000 mg | ORAL_TABLET | Freq: Every day | ORAL | 1 refills | Status: DC

## 2018-08-14 ENCOUNTER — Other Ambulatory Visit: Payer: Self-pay | Admitting: Family Medicine

## 2018-08-14 DIAGNOSIS — G2581 Restless legs syndrome: Secondary | ICD-10-CM

## 2018-09-03 ENCOUNTER — Other Ambulatory Visit: Payer: Self-pay | Admitting: Family Medicine

## 2018-09-03 ENCOUNTER — Encounter: Payer: Self-pay | Admitting: Family Medicine

## 2018-09-06 MED ORDER — AMLODIPINE BESYLATE 5 MG PO TABS: 5.0000 mg | ORAL_TABLET | Freq: Every day | ORAL | 1 refills | Status: DC

## 2018-09-06 MED ORDER — TRIAMTERENE-HCTZ 37.5-25 MG PO TABS: 1.0000 | ORAL_TABLET | Freq: Every day | ORAL | 1 refills | Status: DC

## 2018-09-14 ENCOUNTER — Other Ambulatory Visit: Payer: Self-pay | Admitting: Family Medicine

## 2018-09-14 DIAGNOSIS — G2581 Restless legs syndrome: Secondary | ICD-10-CM

## 2018-09-29 ENCOUNTER — Other Ambulatory Visit: Payer: Self-pay | Admitting: Family Medicine

## 2018-10-16 ENCOUNTER — Other Ambulatory Visit: Payer: Self-pay | Admitting: Family Medicine

## 2018-10-16 DIAGNOSIS — G2581 Restless legs syndrome: Secondary | ICD-10-CM

## 2018-10-23 ENCOUNTER — Other Ambulatory Visit: Payer: Self-pay | Admitting: Family Medicine

## 2018-10-23 DIAGNOSIS — E1165 Type 2 diabetes mellitus with hyperglycemia: Secondary | ICD-10-CM

## 2018-10-23 DIAGNOSIS — Z794 Long term (current) use of insulin: Secondary | ICD-10-CM

## 2018-11-18 ENCOUNTER — Other Ambulatory Visit: Payer: Self-pay | Admitting: Family Medicine

## 2018-11-18 DIAGNOSIS — G2581 Restless legs syndrome: Secondary | ICD-10-CM

## 2018-11-27 ENCOUNTER — Other Ambulatory Visit: Payer: Self-pay | Admitting: Family Medicine

## 2018-11-27 DIAGNOSIS — E1165 Type 2 diabetes mellitus with hyperglycemia: Secondary | ICD-10-CM

## 2018-11-27 DIAGNOSIS — Z794 Long term (current) use of insulin: Secondary | ICD-10-CM

## 2018-12-14 ENCOUNTER — Other Ambulatory Visit: Payer: Self-pay | Admitting: Family Medicine

## 2018-12-14 DIAGNOSIS — Z794 Long term (current) use of insulin: Secondary | ICD-10-CM

## 2018-12-14 DIAGNOSIS — E1165 Type 2 diabetes mellitus with hyperglycemia: Secondary | ICD-10-CM

## 2018-12-14 DIAGNOSIS — G2581 Restless legs syndrome: Secondary | ICD-10-CM

## 2018-12-21 ENCOUNTER — Other Ambulatory Visit: Payer: Self-pay | Admitting: Family Medicine

## 2018-12-23 ENCOUNTER — Other Ambulatory Visit: Payer: Self-pay | Admitting: Family Medicine

## 2018-12-23 DIAGNOSIS — Z794 Long term (current) use of insulin: Secondary | ICD-10-CM

## 2018-12-23 DIAGNOSIS — M7989 Other specified soft tissue disorders: Secondary | ICD-10-CM

## 2019-01-05 ENCOUNTER — Other Ambulatory Visit: Payer: Self-pay | Admitting: Family Medicine

## 2019-01-06 NOTE — Telephone Encounter (Signed)
Last OV 04/01/18 Last refill 02/03/18 # 90/2 Next OV not scheduled

## 2019-01-18 ENCOUNTER — Other Ambulatory Visit: Payer: Self-pay | Admitting: Family Medicine

## 2019-01-18 DIAGNOSIS — G2581 Restless legs syndrome: Secondary | ICD-10-CM

## 2019-01-23 ENCOUNTER — Other Ambulatory Visit: Payer: Self-pay | Admitting: Family Medicine

## 2019-01-26 ENCOUNTER — Other Ambulatory Visit: Payer: Self-pay | Admitting: Family Medicine

## 2019-01-26 DIAGNOSIS — Z794 Long term (current) use of insulin: Secondary | ICD-10-CM

## 2019-01-26 DIAGNOSIS — E1165 Type 2 diabetes mellitus with hyperglycemia: Secondary | ICD-10-CM

## 2019-01-27 NOTE — Telephone Encounter (Signed)
Last OV 04/01/18 Last refill 12/24/18 #60//0 Next OV not scheduled

## 2019-02-12 ENCOUNTER — Other Ambulatory Visit: Payer: Self-pay | Admitting: Family Medicine

## 2019-02-17 ENCOUNTER — Other Ambulatory Visit: Payer: Self-pay | Admitting: Family Medicine

## 2019-02-17 DIAGNOSIS — G2581 Restless legs syndrome: Secondary | ICD-10-CM

## 2019-03-07 ENCOUNTER — Other Ambulatory Visit: Payer: Self-pay | Admitting: Family Medicine

## 2019-03-09 ENCOUNTER — Ambulatory Visit (INDEPENDENT_AMBULATORY_CARE_PROVIDER_SITE_OTHER): Payer: Self-pay | Admitting: Family Medicine

## 2019-03-09 ENCOUNTER — Encounter: Payer: Self-pay | Admitting: Family Medicine

## 2019-03-09 ENCOUNTER — Other Ambulatory Visit: Payer: Self-pay

## 2019-03-09 VITALS — BP 120/62 | HR 102 | Temp 97.8°F | Ht 62.0 in | Wt 214.0 lb

## 2019-03-09 DIAGNOSIS — I10 Essential (primary) hypertension: Secondary | ICD-10-CM

## 2019-03-09 DIAGNOSIS — E1165 Type 2 diabetes mellitus with hyperglycemia: Secondary | ICD-10-CM

## 2019-03-09 DIAGNOSIS — Z794 Long term (current) use of insulin: Secondary | ICD-10-CM

## 2019-03-09 MED ORDER — CELECOXIB 200 MG PO CAPS: 200.0000 mg | ORAL_CAPSULE | Freq: Two times a day (BID) | ORAL | 0 refills | Status: DC

## 2019-03-09 MED ORDER — ATORVASTATIN CALCIUM 40 MG PO TABS: 40.0000 mg | ORAL_TABLET | Freq: Every day | ORAL | 2 refills | Status: DC

## 2019-03-09 MED ORDER — NOVOLIN 70/30 FLEXPEN RELION (70-30) 100 UNIT/ML ~~LOC~~ SUPN: PEN_INJECTOR | SUBCUTANEOUS | 0 refills | Status: DC

## 2019-03-09 MED ORDER — AMLODIPINE BESYLATE 5 MG PO TABS: 5.0000 mg | ORAL_TABLET | Freq: Every day | ORAL | 1 refills | Status: DC

## 2019-03-09 MED ORDER — LOSARTAN POTASSIUM 25 MG PO TABS: 25.0000 mg | ORAL_TABLET | Freq: Every day | ORAL | 1 refills | Status: DC

## 2019-03-09 NOTE — Progress Notes (Signed)
Subjective:   Chief Complaint  Patient presents with  . Follow-up    Cristina Burns is a 59 y.o. female here for follow-up of diabetes.    [name]'s self monitored glucose range is 200-300's. Had dropped to mid 100's after losing wt. Patient has been having some hypoglycemic reactions. Patient does require insulin.  75 u bid 70/30 Novolog.  Medications include: Metformin, Amaryl Diet is healthy Exercise: none 2/2 hip  Hypertension Patient presents for hypertension follow up. She does not routinely monitor home blood pressures. She is compliant with medications-Norvasc 5 mg daily, losartan 25 mg daily, Maxide 37.5-25 mg daily. Patient has these side effects of medication: none Diet and exercise as above.  Past Medical History:  Diagnosis Date  . ADHD (attention deficit hyperactivity disorder)   . Allergic rhinitis   . Allergy   . Diabetes mellitus without complication ([place])   . Diabetic neuropathy ([place])   . GERD (gastroesophageal reflux disease)   . Hypertension   . Hypothyroidism   . RLS (restless legs syndrome)   . Stomach ulcer "it's been a long time"     Related testing: Date of retinal exam: Due Pneumovax: done Flu Shot: done  Review of Systems: Pulmonary:  No SOB Cardiovascular:  No chest pain  Objective:  BP 120/62 (BP Location: Left Arm, Patient Position: Sitting, Cuff Size: Normal)   Pulse (!) 102   Temp 97.8 F (36.6 C) (Temporal)   Ht 5\' 2"  (1.575 m)   Wt 214 lb (97.1 kg)   SpO2 95%   BMI 39.14 kg/m  General:  Well developed, well nourished, in no apparent distress Skin:  Warm, no pallor or diaphoresis Head:  Normocephalic, atraumatic Eyes:  Pupils equal and round, sclera anicteric without injection  Lungs:  CTAB, no access msc use Cardio:  RRR, no bruits, 1+ pitting b/l LE edema tapering mid tibia Psych: Age appropriate judgment and insight  Assessment:   Type 2 diabetes mellitus with hyperglycemia, with long-term current use of insulin  (HCC) - Plan: Hemoglobin A1c  Essential hypertension   Plan:   It sounds like she is on a new eating plan that is helping with her sugars.  Ideally we would go back to basal bolus insulin once she has insurance again.  We will stick with 70/30 until that time comes. Counseled on diet and exercise. Her blood pressure is under excellent control.  No changes. F/u in 3 mo. The patient voiced understanding and agreement to the plan.  [place], DO 03/09/19 8:07 PM

## 2019-03-09 NOTE — Patient Instructions (Addendum)
Give [place] 2-3 business days to get the results of your labs back.   Keep the diet clean and stay active as your hip will let you.  Let us know if you need anything.

## 2019-03-10 LAB — HEMOGLOBIN A1C
Hgb A1c MFr Bld: 8.6 % of total Hgb — ABNORMAL HIGH (ref <5.7)
Mean Plasma Glucose: 200 (calc)
eAG (mmol/L): 11.1 (calc)

## 2019-03-12 ENCOUNTER — Other Ambulatory Visit: Payer: Self-pay | Admitting: Family Medicine

## 2019-03-16 ENCOUNTER — Encounter: Payer: Self-pay | Admitting: Family Medicine

## 2019-03-16 ENCOUNTER — Other Ambulatory Visit: Payer: Self-pay | Admitting: Family Medicine

## 2019-03-19 ENCOUNTER — Other Ambulatory Visit: Payer: Self-pay | Admitting: Family Medicine

## 2019-03-19 DIAGNOSIS — G2581 Restless legs syndrome: Secondary | ICD-10-CM

## 2019-03-19 DIAGNOSIS — E1165 Type 2 diabetes mellitus with hyperglycemia: Secondary | ICD-10-CM

## 2019-03-19 DIAGNOSIS — Z794 Long term (current) use of insulin: Secondary | ICD-10-CM

## 2019-03-27 ENCOUNTER — Other Ambulatory Visit: Payer: Self-pay | Admitting: Family Medicine

## 2019-03-27 DIAGNOSIS — Z794 Long term (current) use of insulin: Secondary | ICD-10-CM

## 2019-03-27 DIAGNOSIS — E1165 Type 2 diabetes mellitus with hyperglycemia: Secondary | ICD-10-CM

## 2019-04-02 ENCOUNTER — Other Ambulatory Visit: Payer: Self-pay | Admitting: Family Medicine

## 2019-04-02 DIAGNOSIS — G2581 Restless legs syndrome: Secondary | ICD-10-CM

## 2019-04-14 ENCOUNTER — Other Ambulatory Visit: Payer: Self-pay | Admitting: Family Medicine

## 2019-04-15 ENCOUNTER — Other Ambulatory Visit: Payer: Self-pay | Admitting: Family Medicine

## 2019-04-15 DIAGNOSIS — G2581 Restless legs syndrome: Secondary | ICD-10-CM

## 2019-04-15 MED ORDER — ROPINIROLE HCL 2 MG PO TABS: ORAL_TABLET | ORAL | 5 refills | Status: DC

## 2019-04-15 MED ORDER — METHYLPHENIDATE HCL 20 MG PO TABS: ORAL_TABLET | ORAL | 0 refills | Status: DC

## 2019-04-15 MED ORDER — METHYLPHENIDATE HCL ER (LA) 20 MG PO CP24: 20.0000 mg | ORAL_CAPSULE | ORAL | 0 refills | Status: DC

## 2019-04-15 NOTE — Telephone Encounter (Signed)
Last written:  Last ov: 03/09/19 Next ov: 06/06/19 Contract: 04/01/18 UDS: none

## 2019-04-26 ENCOUNTER — Other Ambulatory Visit: Payer: Self-pay | Admitting: Family Medicine

## 2019-04-26 DIAGNOSIS — Z794 Long term (current) use of insulin: Secondary | ICD-10-CM

## 2019-04-26 DIAGNOSIS — E1165 Type 2 diabetes mellitus with hyperglycemia: Secondary | ICD-10-CM

## 2019-05-02 ENCOUNTER — Other Ambulatory Visit: Payer: Self-pay | Admitting: *Deleted

## 2019-05-02 DIAGNOSIS — G2581 Restless legs syndrome: Secondary | ICD-10-CM

## 2019-05-02 MED ORDER — ROPINIROLE HCL 2 MG PO TABS: ORAL_TABLET | ORAL | 1 refills | Status: DC

## 2019-05-14 ENCOUNTER — Other Ambulatory Visit: Payer: Self-pay | Admitting: Family Medicine

## 2019-05-26 ENCOUNTER — Other Ambulatory Visit: Payer: Self-pay | Admitting: Family Medicine

## 2019-05-29 ENCOUNTER — Other Ambulatory Visit: Payer: Self-pay | Admitting: Family Medicine

## 2019-05-29 DIAGNOSIS — Z794 Long term (current) use of insulin: Secondary | ICD-10-CM

## 2019-05-29 DIAGNOSIS — E1165 Type 2 diabetes mellitus with hyperglycemia: Secondary | ICD-10-CM

## 2019-06-06 ENCOUNTER — Ambulatory Visit: Payer: Self-pay | Admitting: Family Medicine

## 2019-06-13 ENCOUNTER — Other Ambulatory Visit: Payer: Self-pay | Admitting: Family Medicine

## 2019-06-14 ENCOUNTER — Other Ambulatory Visit: Payer: Self-pay | Admitting: Family Medicine

## 2019-06-14 DIAGNOSIS — G2581 Restless legs syndrome: Secondary | ICD-10-CM

## 2019-06-14 MED ORDER — ROPINIROLE HCL 2 MG PO TABS: ORAL_TABLET | ORAL | 1 refills | Status: DC

## 2019-06-17 ENCOUNTER — Ambulatory Visit: Payer: Self-pay | Admitting: Family Medicine

## 2019-06-21 ENCOUNTER — Other Ambulatory Visit: Payer: Self-pay | Admitting: Family Medicine

## 2019-06-21 DIAGNOSIS — Z794 Long term (current) use of insulin: Secondary | ICD-10-CM

## 2019-06-24 ENCOUNTER — Ambulatory Visit (INDEPENDENT_AMBULATORY_CARE_PROVIDER_SITE_OTHER): Payer: Self-pay | Admitting: Family Medicine

## 2019-06-24 ENCOUNTER — Other Ambulatory Visit: Payer: Self-pay

## 2019-06-24 ENCOUNTER — Encounter: Payer: Self-pay | Admitting: Family Medicine

## 2019-06-24 VITALS — BP 132/64 | HR 103 | Temp 97.4°F | Ht 62.0 in | Wt 200.4 lb

## 2019-06-24 DIAGNOSIS — G2581 Restless legs syndrome: Secondary | ICD-10-CM

## 2019-06-24 DIAGNOSIS — F988 Other specified behavioral and emotional disorders with onset usually occurring in childhood and adolescence: Secondary | ICD-10-CM

## 2019-06-24 DIAGNOSIS — E1165 Type 2 diabetes mellitus with hyperglycemia: Secondary | ICD-10-CM

## 2019-06-24 DIAGNOSIS — Z794 Long term (current) use of insulin: Secondary | ICD-10-CM

## 2019-06-24 DIAGNOSIS — L299 Pruritus, unspecified: Secondary | ICD-10-CM

## 2019-06-24 MED ORDER — MONTELUKAST SODIUM 10 MG PO TABS: 10.0000 mg | ORAL_TABLET | Freq: Every day | ORAL | 3 refills | Status: DC

## 2019-06-24 MED ORDER — GABAPENTIN 600 MG PO TABS: 600.0000 mg | ORAL_TABLET | Freq: Three times a day (TID) | ORAL | 5 refills | Status: DC

## 2019-06-24 MED ORDER — METHYLPHENIDATE HCL ER (LA) 20 MG PO CP24: 20.0000 mg | ORAL_CAPSULE | ORAL | 0 refills | Status: DC

## 2019-06-24 MED ORDER — GLIMEPIRIDE 4 MG PO TABS: ORAL_TABLET | ORAL | 5 refills | Status: DC

## 2019-06-24 MED ORDER — LEVOCETIRIZINE DIHYDROCHLORIDE 5 MG PO TABS: 5.0000 mg | ORAL_TABLET | Freq: Every evening | ORAL | 2 refills | Status: DC

## 2019-06-24 MED ORDER — NOVOLIN 70/30 FLEXPEN RELION (70-30) 100 UNIT/ML ~~LOC~~ SUPN: PEN_INJECTOR | SUBCUTANEOUS | 0 refills | Status: DC

## 2019-06-24 MED ORDER — METHYLPHENIDATE HCL 20 MG PO TABS: ORAL_TABLET | ORAL | 0 refills | Status: DC

## 2019-06-24 NOTE — Progress Notes (Signed)
Subjective:   Chief Complaint  Patient presents with  . Follow-up    Cristina Burns is a 59 y.o. female here for follow-up of diabetes.   [place]'s self monitored glucose range is 200's+ in evenings, 80-90's to high 100's in AM.  Patient denies hypoglycemic reactions. She checks her glucose levels 2-4 times per day. Patient does require insulin.   Medications include: Amaryl 4 mg bid, Metformin XR 1000 mg bid,  Diet is excellent during the week, could be much better on the weekend.  Exercise: starting to walk again  Itching that is worse over back. Will wake up at night itching. Uses scented lotions daily. No new topicals. +hx of allergies.   Past Medical History:  Diagnosis Date  . ADHD (attention deficit hyperactivity disorder)   . Allergic rhinitis   . Allergy   . Diabetes mellitus without complication ([place])   . Diabetic neuropathy ([place])   . GERD (gastroesophageal reflux disease)   . Hypertension   . Hypothyroidism   . RLS (restless legs syndrome)   . Stomach ulcer "it's been a long time"     Related testing: Date of retinal exam: Due Pneumovax: done Flu Shot: done  Objective:  BP 132/64 (BP Location: Left Arm, Patient Position: Sitting, Cuff Size: Normal)   Pulse (!) 103   Temp (!) 97.4 F (36.3 C) (Temporal)   Ht 5\' 2"  (1.575 m)   Wt 200 lb 6 oz (90.9 kg)   SpO2 97%   BMI 36.65 kg/m  General:  Well developed, well nourished, in no apparent distress Skin:  Warm, no pallor or diaphoresis Head:  Normocephalic, atraumatic Eyes:  Pupils equal and round, sclera anicteric without injection  Lungs:  CTAB, no access msc use Cardio:  RRR, no bruits, no LE edema Musculoskeletal:  Symmetrical muscle groups noted without atrophy or deformity Neuro:  Sensation intact to pinprick on feet Psych: Age appropriate judgment and insight  Assessment:   Type 2 diabetes mellitus with hyperglycemia, with long-term current use of insulin (HCC) - Plan: Hemoglobin A1c, insulin  isophane & regular human (NOVOLIN 70/30 FLEXPEN RELION) (70-30) 100 UNIT/ML KwikPen, gabapentin (NEURONTIN) 600 MG tablet, glimepiride (AMARYL) 4 MG tablet, CANCELED: Hemoglobin A1c  RLS (restless legs syndrome)  [place] - Plan: levocetirizine (XYZAL) 5 MG tablet, montelukast (SINGULAIR) 10 MG tablet  Attention deficit disorder, unspecified hyperactivity presence - Plan: methylphenidate (RITALIN) 20 MG tablet, methylphenidate (RITALIN LA) 20 MG 24 hr capsule, methylphenidate (RITALIN LA) 20 MG 24 hr capsule, methylphenidate (RITALIN LA) 20 MG 24 hr capsule, methylphenidate (RITALIN) 20 MG tablet, methylphenidate (RITALIN) 20 MG tablet   Plan:   1- Increase insulin to 67 u in AM and 62 u in evening. No more nighttime eating, just drop down insulin and eat normally. Counseled on diet/exercise. She is starting to lose weight. Does not have insurance currently so are holding off on ordering more apropriate labs and getting her specialty appts.  2- Cont Requip. 3- Change Claritin to Levocetirizine. Add back Singulair in 1 week.  4- Cont Ritalin. F/u in 3 mo for DM unless she is able to get insurance and set up with endo.  The patient voiced understanding and agreement to the plan.  [employer], DO 06/24/19 4:20 PM

## 2019-06-24 NOTE — Patient Instructions (Addendum)
Send me a message if/when you get medical insurance.   Change Claritin to Xyzal (levocetirizine) for one week. After that, add back the montelukast.   Give [place] 2-3 business days to get the results of your labs back.   Check sugars in the morning before eating and 1-2 hours after meals on average.   Avoid scented products. Use lotions at least twice daily. The new medicine should help.  Keep the diet clean and stay active.  Let me know when your insurance kicks in.  Use a heating pad on your back before stretching.  Let us know if you need anything.

## 2019-06-25 LAB — HEMOGLOBIN A1C
Hgb A1c MFr Bld: 7.2 % of total Hgb — ABNORMAL HIGH (ref <5.7)
Mean Plasma Glucose: 160 (calc)
eAG (mmol/L): 8.9 (calc)

## 2019-07-13 ENCOUNTER — Other Ambulatory Visit: Payer: Self-pay | Admitting: Family Medicine

## 2019-07-13 DIAGNOSIS — E1165 Type 2 diabetes mellitus with hyperglycemia: Secondary | ICD-10-CM

## 2019-07-13 DIAGNOSIS — Z794 Long term (current) use of insulin: Secondary | ICD-10-CM

## 2019-07-20 ENCOUNTER — Other Ambulatory Visit: Payer: Self-pay | Admitting: Family Medicine

## 2019-08-02 ENCOUNTER — Telehealth: Payer: Self-pay | Admitting: Family Medicine

## 2019-08-02 NOTE — Telephone Encounter (Signed)
Called the drug rep. That supplies [place] per PCP instruction to do.. Discussed with PCP tresiba. They left  of #4 boxes of Tresiba 200 unit  for this patient. Called the patient to pickup at her convenience. Per PCP instructions to injection 50 units twice daily. Called the patient to come by the office to pickup. Is in the frig in nurses station. Instructions on all boxes how to take The patient verbalized understanding.

## 2019-08-04 ENCOUNTER — Other Ambulatory Visit: Payer: Self-pay | Admitting: Family Medicine

## 2019-08-04 DIAGNOSIS — Z794 Long term (current) use of insulin: Secondary | ICD-10-CM

## 2019-09-06 ENCOUNTER — Other Ambulatory Visit: Payer: Self-pay | Admitting: Family Medicine

## 2019-09-06 DIAGNOSIS — Z794 Long term (current) use of insulin: Secondary | ICD-10-CM

## 2019-09-06 DIAGNOSIS — E1165 Type 2 diabetes mellitus with hyperglycemia: Secondary | ICD-10-CM

## 2019-09-06 MED ORDER — NOVOLIN 70/30 FLEXPEN RELION (70-30) 100 UNIT/ML ~~LOC~~ SUPN: PEN_INJECTOR | SUBCUTANEOUS | 3 refills | Status: DC

## 2019-09-08 ENCOUNTER — Other Ambulatory Visit: Payer: Self-pay | Admitting: Family Medicine

## 2019-09-08 DIAGNOSIS — Z794 Long term (current) use of insulin: Secondary | ICD-10-CM

## 2019-09-08 DIAGNOSIS — E1165 Type 2 diabetes mellitus with hyperglycemia: Secondary | ICD-10-CM

## 2019-09-09 IMAGING — CT CT RENAL STONE PROTOCOL
2 of 4 series · 17 of 46 positions shown, 19 images · non-contrast
Comparison: 05/31/2015 and prior CTs

CLINICAL DATA: 56-year-old female with left flank pain for 2-3
months, increased today with nausea.

EXAM:
CT ABDOMEN AND PELVIS WITHOUT CONTRAST
TECHNIQUE: Multidetector CT imaging of the abdomen and pelvis was performed
following the standard protocol without IV contrast.

[Series 2: axial st · axial · 0.85mm/px · z∈[-490,-30]mm · 14 of 102 slices shown, 16 images]
[im 5/102  soft-tissue]
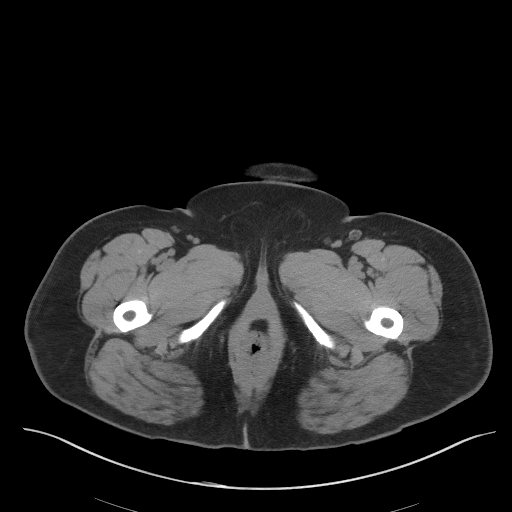
[im 5/102  bone]
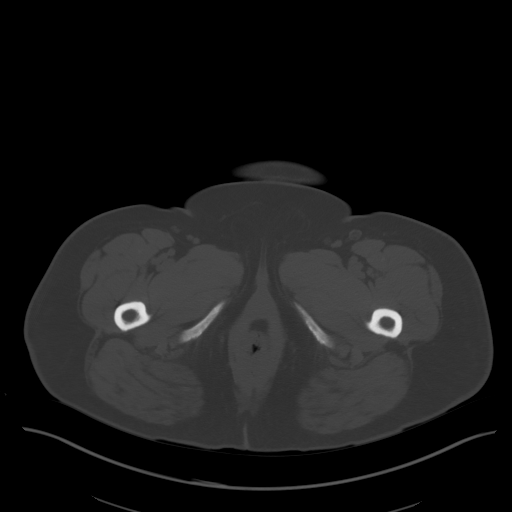
[im 14/102  soft-tissue]
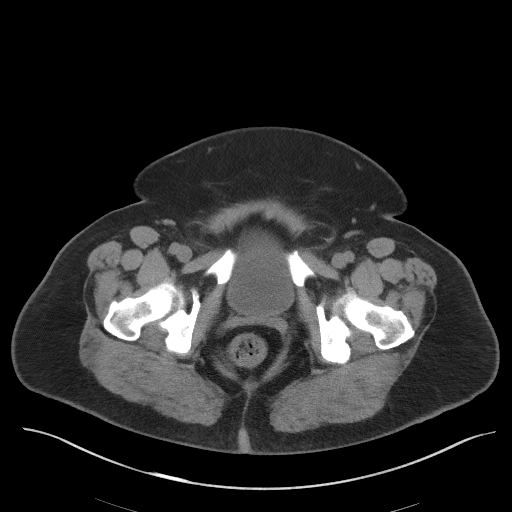
[im 19/102  soft-tissue]
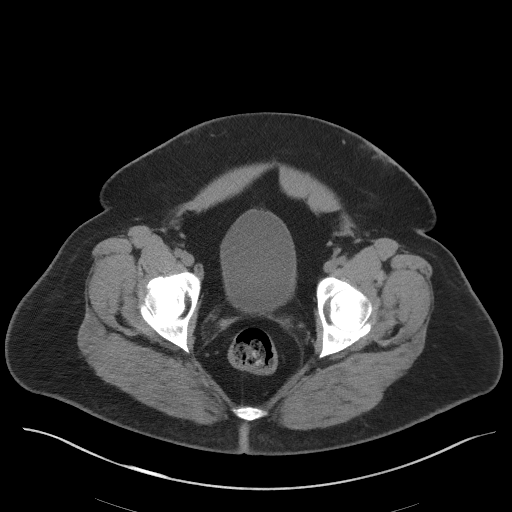
[im 28/102  soft-tissue]
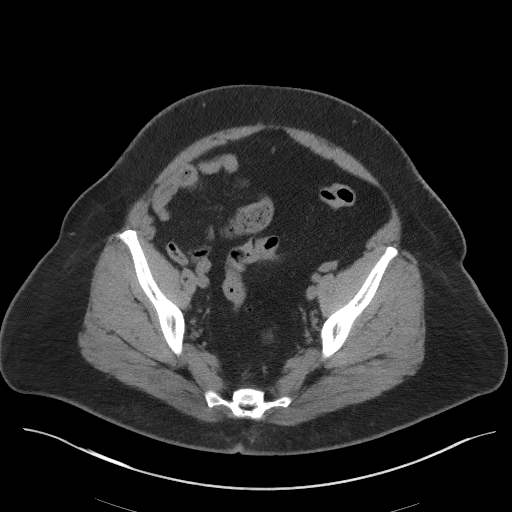
[im 33/102  soft-tissue]
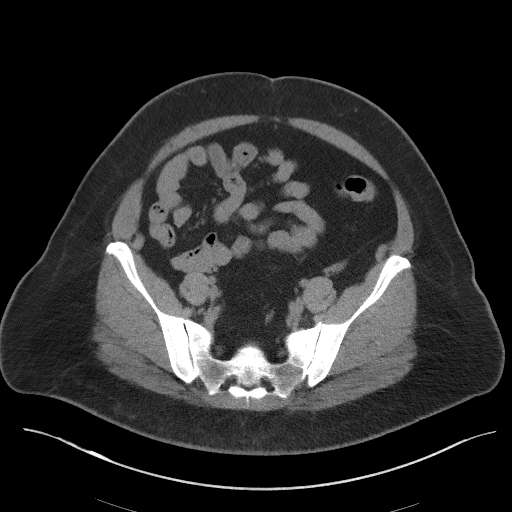
[im 42/102  soft-tissue]
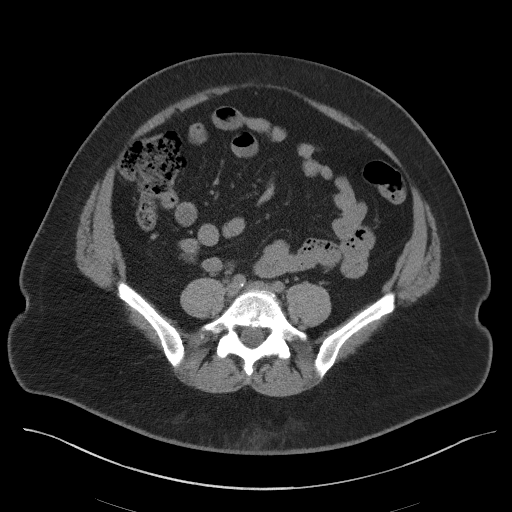
[im 46/102  soft-tissue]
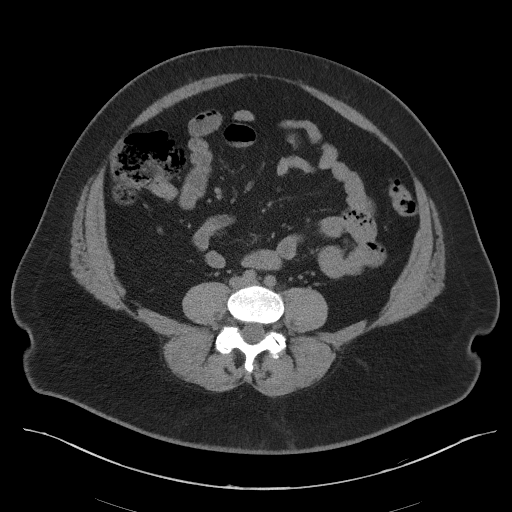
[im 56/102  soft-tissue]
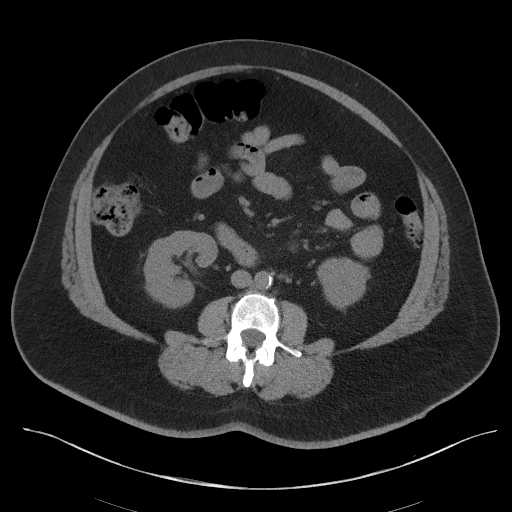
[im 60/102  soft-tissue]
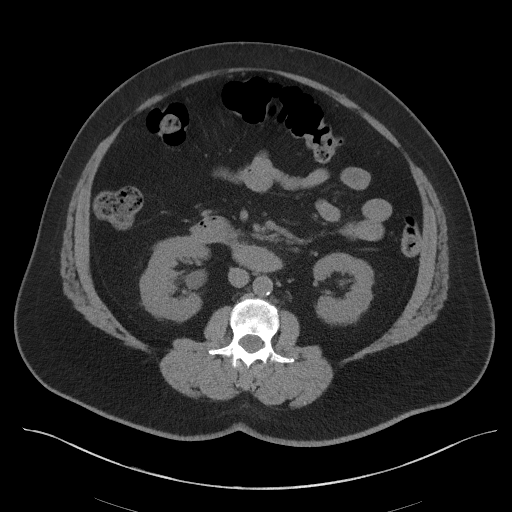
[im 60/102  bone]
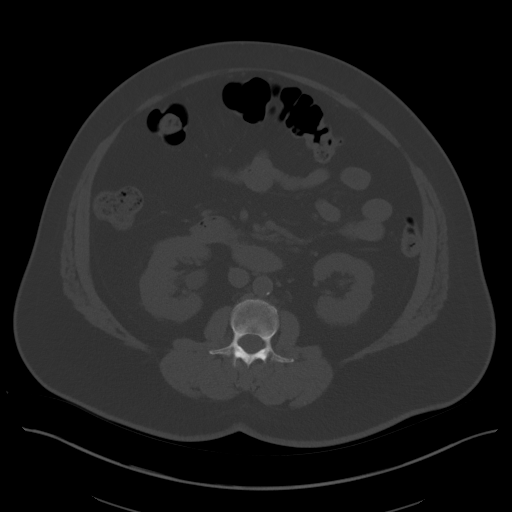
[im 69/102  soft-tissue]
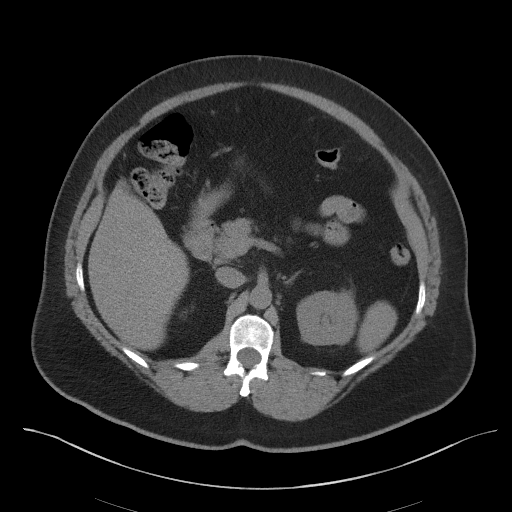
[im 74/102  soft-tissue]
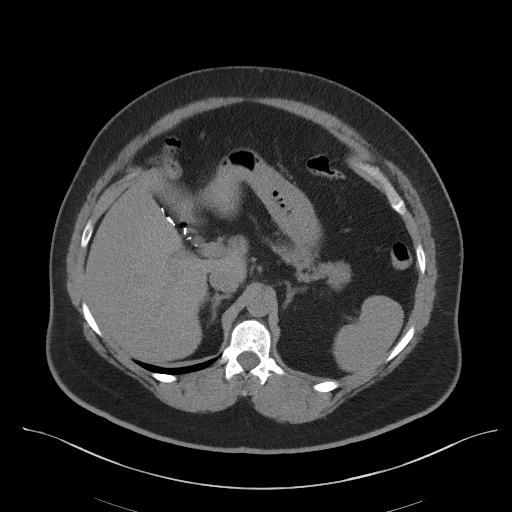
[im 83/102  soft-tissue]
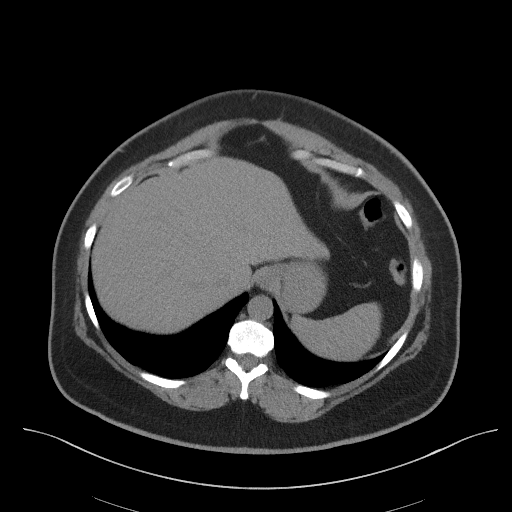
[im 88/102  soft-tissue]
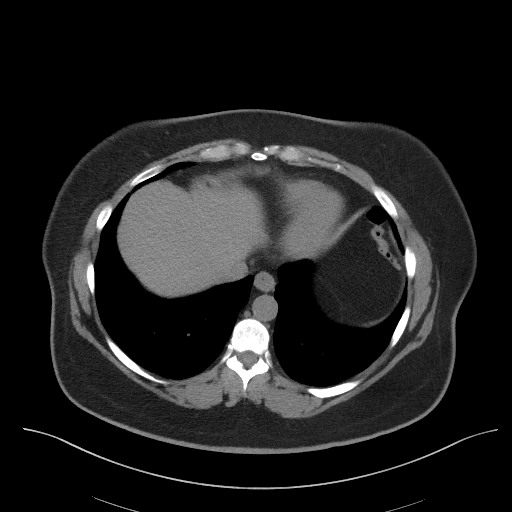
[im 97/102  soft-tissue]
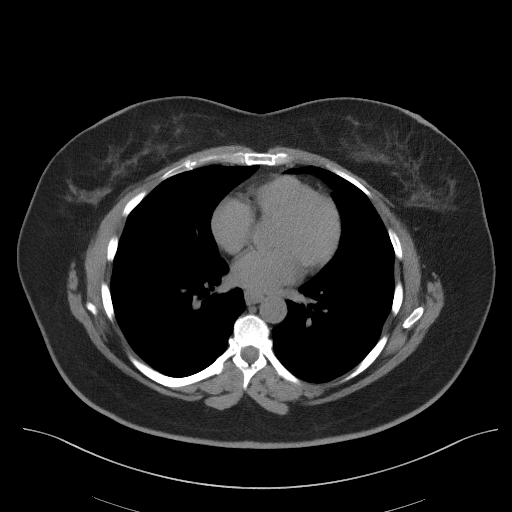

[Series 5: coronal st · coronal · 0.87mm/px · 3 of 109 slices shown]
[im 37/109  soft-tissue]
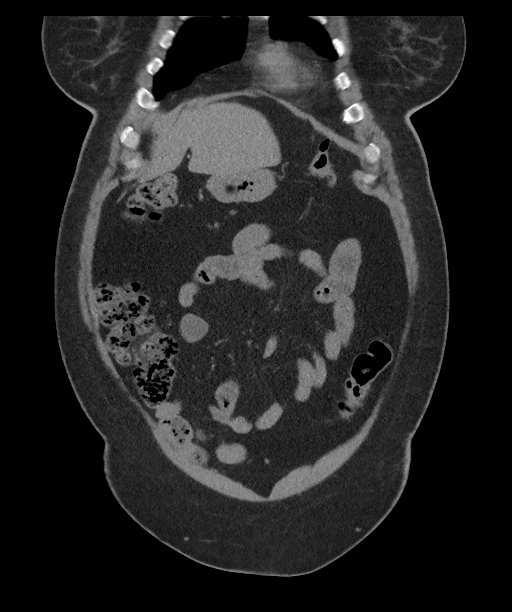
[im 49/109  soft-tissue]
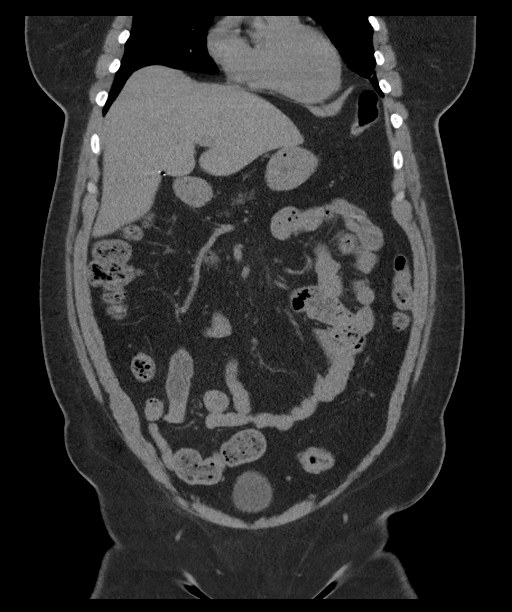
[im 61/109  soft-tissue]
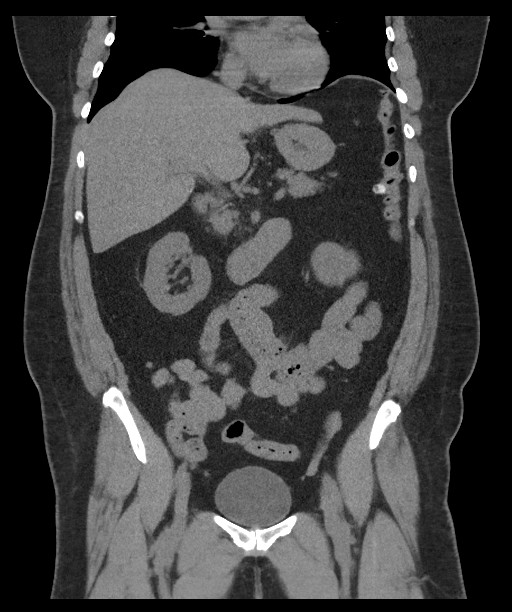

[17 of 46 positions shown; findings below may reference images not displayed]

FINDINGS: Please note that parenchymal abnormalities may be missed without
intravenous contrast.

Lower chest: No acute abnormality.

Hepatobiliary: The liver is unremarkable. The patient is status post
cholecystectomy. No biliary dilatation.

Pancreas: Unremarkable

Spleen: Unremarkable

Adrenals/Urinary Tract: The kidneys, adrenal glands and bladder are
unremarkable.

Stomach/Bowel: Stomach is within normal limits. Appendix appears
normal. No evidence of bowel wall thickening, distention, or
inflammatory changes.

Vascular/Lymphatic: Aortic atherosclerosis. No enlarged abdominal or
pelvic lymph nodes.

Reproductive: Status post hysterectomy. No adnexal masses.

Other: No ascites, focal collection or pneumoperitoneum. No
abdominal wall hernia identified.

Musculoskeletal: No acute or significant osseous findings.
IMPRESSION: 1. No acute abnormality or findings to suggest a cause for this
patient's left flank pain.
2.  Aortic Atherosclerosis ([id number]).

## 2019-09-12 ENCOUNTER — Other Ambulatory Visit: Payer: Self-pay | Admitting: Family Medicine

## 2019-09-12 DIAGNOSIS — Z794 Long term (current) use of insulin: Secondary | ICD-10-CM

## 2019-09-12 MED ORDER — NOVOLIN 70/30 FLEXPEN RELION (70-30) 100 UNIT/ML ~~LOC~~ SUPN: PEN_INJECTOR | SUBCUTANEOUS | 3 refills | Status: DC

## 2019-10-05 ENCOUNTER — Ambulatory Visit: Payer: Medicaid Other | Admitting: Family Medicine

## 2019-10-21 ENCOUNTER — Ambulatory Visit: Payer: Medicaid Other | Admitting: Family Medicine

## 2019-10-26 ENCOUNTER — Other Ambulatory Visit: Payer: Self-pay | Admitting: Family Medicine

## 2019-11-01 ENCOUNTER — Other Ambulatory Visit: Payer: Self-pay

## 2019-11-01 ENCOUNTER — Encounter: Payer: Self-pay | Admitting: Family Medicine

## 2019-11-01 ENCOUNTER — Ambulatory Visit (INDEPENDENT_AMBULATORY_CARE_PROVIDER_SITE_OTHER): Payer: Self-pay | Admitting: Family Medicine

## 2019-11-01 VITALS — BP 154/70 | HR 127 | Temp 98.4°F | Ht 62.0 in | Wt 198.0 lb

## 2019-11-01 DIAGNOSIS — E1165 Type 2 diabetes mellitus with hyperglycemia: Secondary | ICD-10-CM

## 2019-11-01 DIAGNOSIS — Z1231 Encounter for screening mammogram for malignant neoplasm of breast: Secondary | ICD-10-CM

## 2019-11-01 DIAGNOSIS — R053 Chronic cough: Secondary | ICD-10-CM | POA: Insufficient documentation

## 2019-11-01 DIAGNOSIS — E039 Hypothyroidism, unspecified: Secondary | ICD-10-CM

## 2019-11-01 DIAGNOSIS — R110 Nausea: Secondary | ICD-10-CM

## 2019-11-01 DIAGNOSIS — I10 Essential (primary) hypertension: Secondary | ICD-10-CM

## 2019-11-01 DIAGNOSIS — Z794 Long term (current) use of insulin: Secondary | ICD-10-CM

## 2019-11-01 DIAGNOSIS — R109 Unspecified abdominal pain: Secondary | ICD-10-CM

## 2019-11-01 DIAGNOSIS — F988 Other specified behavioral and emotional disorders with onset usually occurring in childhood and adolescence: Secondary | ICD-10-CM

## 2019-11-01 MED ORDER — INSULIN GLARGINE 100 UNIT/ML ~~LOC~~ SOLN: 68.0000 [IU] | Freq: Two times a day (BID) | SUBCUTANEOUS | 11 refills | Status: DC

## 2019-11-01 MED ORDER — INSULIN LISPRO (1 UNIT DIAL) 100 UNIT/ML (KWIKPEN): PEN_INJECTOR | SUBCUTANEOUS | 0 refills | Status: DC

## 2019-11-01 MED ORDER — METHYLPHENIDATE HCL 20 MG PO TABS: ORAL_TABLET | ORAL | 0 refills | Status: DC

## 2019-11-01 MED ORDER — DICYCLOMINE HCL 10 MG PO CAPS: ORAL_CAPSULE | ORAL | 0 refills | Status: DC

## 2019-11-01 MED ORDER — METHYLPHENIDATE HCL ER (LA) 20 MG PO CP24: 20.0000 mg | ORAL_CAPSULE | ORAL | 0 refills | Status: DC

## 2019-11-01 MED ORDER — AZELASTINE HCL 0.1 % NA SOLN: 2.0000 | Freq: Two times a day (BID) | NASAL | 12 refills | Status: DC

## 2019-11-01 MED ORDER — LOSARTAN POTASSIUM 50 MG PO TABS: 50.0000 mg | ORAL_TABLET | Freq: Every day | ORAL | 3 refills | Status: DC

## 2019-11-01 MED ORDER — ALBUTEROL SULFATE HFA 108 (90 BASE) MCG/ACT IN AERS: 2.0000 | INHALATION_SPRAY | Freq: Four times a day (QID) | RESPIRATORY_TRACT | 0 refills | Status: DC | PRN

## 2019-11-01 NOTE — Patient Instructions (Addendum)
Be fasting for your future labs.   If you do not hear anything about your referral in the next 1-2 weeks, call our office and ask for an update.  Keep the diet clean and stay active.  Let us know if you need anything.

## 2019-11-01 NOTE — Progress Notes (Signed)
Subjective:   Chief Complaint  Patient presents with  . Follow-up    diabetes    Cristina Burns is a 59 y.o. female here for follow-up of diabetes.   Joda's self monitored glucose range is 200's. It has been like this for the past month.  Patient denies hypoglycemic reactions. She checks her glucose levels 2 times per day. Patient does require insulin.   Medications include: Amaryl 4 m bid, metformin XR 1000 mg bid Diet is healthy, increased carbs  Exercise: no scheduled exercise  Hypertension Patient presents for hypertension follow up. She does not routinely monitor home blood pressures. She is compliant with medications. Patient has these side effects of medication: none Diet/exercise as above.   Chronic cough Cough has been bothering for a couple mo. she tried to stop taking losartan but not notices significant improvement.  She does have a history of allergies and nasal congestion, worse on the left.  She is compliant with montelukast 10 mg daily, Nasonex, and Xyzal.  She is not having shortness of breath or wheezing.  Patient has a history of ADHD.  She is on Ritalin 20 mg long-acting in the morning and 20 mg short acting in the afternoon.  She is tolerating this well with no side effects.  She reports compliance.  She is not having side effects.  The patient has been having abdominal pain for the last 3 weeks.  It is diffuse and she feels that stool is getting stuck in her outpouchings.  She is not have any diarrhea or bleeding.  She is having nausea without vomiting.  She is compliant with her Protonix.  She tried Mylanta without relief.  No unexplained weight loss.  This is not waking her up at night.  It does not seem to be associated with certain types of foods.  Past Medical History:  Diagnosis Date  . ADHD (attention deficit hyperactivity disorder)   . Allergic rhinitis   . Allergy   . Diabetes mellitus without complication ([place])   . Diabetic neuropathy ([place])    . GERD (gastroesophageal reflux disease)   . Hypertension   . Hypothyroidism   . RLS (restless legs syndrome)   . Stomach ulcer "it's been a long time"     Related testing: Retinal exam: Due Pneumovax: done  Objective:  BP (!) 154/70 (BP Location: Left Arm, Patient Position: Sitting, Cuff Size: Normal)   Pulse (!) 127   Temp 98.4 F (36.9 C) (Oral)   Ht 5\' 2"  (1.575 m)   Wt 198 lb (89.8 kg)   SpO2 97%   BMI 36.21 kg/m  General:  Well developed, well nourished, in no apparent distress Nose: Septal deviation to the left, nares are patent without discharge Head:  Normocephalic, atraumatic Lungs:  CTAB, no access msc use Cardio:  RRR, no bruits, 2+ pitting bilateral LE edema Abdomen: Mildly distended, bowel sounds present, soft, nontender Psych: Age appropriate judgment and insight  Assessment:   Type 2 diabetes mellitus with hyperglycemia, with long-term current use of insulin (HCC) - Plan: insulin lispro (HUMALOG) 100 UNIT/ML KwikPen, insulin glargine (LANTUS) 100 UNIT/ML injection, Ambulatory referral to Ophthalmology, Microalbumin / creatinine urine ratio, Lipid panel, Hemoglobin A1c, Comprehensive metabolic panel  Essential hypertension - Plan: losartan (COZAAR) 50 MG tablet  Attention deficit disorder, unspecified hyperactivity presence - Plan: methylphenidate (RITALIN LA) 20 MG 24 hr capsule, methylphenidate (RITALIN LA) 20 MG 24 hr capsule, methylphenidate (RITALIN LA) 20 MG 24 hr capsule, methylphenidate (RITALIN) 20 MG tablet, methylphenidate (  RITALIN) 20 MG tablet, methylphenidate (RITALIN) 20 MG tablet  Nausea  Abdominal pain, unspecified abdominal location - Plan: dicyclomine (BENTYL) 10 MG capsule  Hypothyroidism, unspecified type - Plan: TSH, T4, free  Chronic cough - Plan: albuterol (VENTOLIN HFA) 108 (90 Base) MCG/ACT inhaler, azelastine (ASTELIN) 0.1 % nasal spray  Encounter for screening mammogram for malignant neoplasm of breast - Plan: MM DIGITAL  SCREENING BILATERAL   Plan:   1.  I will try to switch her back to basal bolus insulin.  Lantus 68 units twice daily and Humalog 7 units plus a sliding scale 1 unit for every 8 g of carbohydrate.  Counseled on diet and exercise.  Continue to monitor sugars. 2.  Go back on losartan, will prescribe 50 mg dosage. 3.  Stable, refill Ritalin. 4/5.  Trial Bentyl 10 mg 4 times daily as needed.  This could be an acute event.  I will see her shortly and if she is still having issues I will refer her to the gastroenterology team.  She will continue Protonix 40 mg twice daily. 6.  Check thyroid studies 7.  I think she can go back on her ARB.  Trial albuterol.  Start Astelin for the nasal congestion.  This may help if there is any postnasal drip, though I do not believe that is the case. F/u in 2-3 weeks. The patient voiced understanding and agreement to the plan.  Greater than 45 minutes were spent face to face with the patient and reviewing her chart.    [place], DO 11/01/19 5:05 PM

## 2019-11-28 ENCOUNTER — Other Ambulatory Visit: Payer: Self-pay | Admitting: Family Medicine

## 2019-11-28 DIAGNOSIS — Z794 Long term (current) use of insulin: Secondary | ICD-10-CM

## 2019-11-28 DIAGNOSIS — E1165 Type 2 diabetes mellitus with hyperglycemia: Secondary | ICD-10-CM

## 2019-12-21 ENCOUNTER — Other Ambulatory Visit: Payer: Self-pay | Admitting: Family Medicine

## 2019-12-25 ENCOUNTER — Other Ambulatory Visit: Payer: Self-pay | Admitting: Family Medicine

## 2019-12-25 DIAGNOSIS — Z794 Long term (current) use of insulin: Secondary | ICD-10-CM

## 2019-12-25 DIAGNOSIS — G2581 Restless legs syndrome: Secondary | ICD-10-CM

## 2019-12-25 DIAGNOSIS — E1165 Type 2 diabetes mellitus with hyperglycemia: Secondary | ICD-10-CM

## 2019-12-27 ENCOUNTER — Other Ambulatory Visit: Payer: Self-pay | Admitting: Family Medicine

## 2019-12-27 DIAGNOSIS — R109 Unspecified abdominal pain: Secondary | ICD-10-CM

## 2020-01-05 ENCOUNTER — Other Ambulatory Visit: Payer: Self-pay | Admitting: Family Medicine

## 2020-01-09 ENCOUNTER — Other Ambulatory Visit: Payer: Self-pay | Admitting: Family Medicine

## 2020-01-09 DIAGNOSIS — L299 Pruritus, unspecified: Secondary | ICD-10-CM

## 2020-01-09 DIAGNOSIS — R109 Unspecified abdominal pain: Secondary | ICD-10-CM

## 2020-01-26 ENCOUNTER — Other Ambulatory Visit: Payer: Self-pay | Admitting: Family Medicine

## 2020-01-26 DIAGNOSIS — L299 Pruritus, unspecified: Secondary | ICD-10-CM

## 2020-01-26 DIAGNOSIS — E1165 Type 2 diabetes mellitus with hyperglycemia: Secondary | ICD-10-CM

## 2020-01-26 DIAGNOSIS — Z794 Long term (current) use of insulin: Secondary | ICD-10-CM

## 2020-01-26 DIAGNOSIS — G2581 Restless legs syndrome: Secondary | ICD-10-CM

## 2020-01-26 DIAGNOSIS — R109 Unspecified abdominal pain: Secondary | ICD-10-CM

## 2020-01-29 DIAGNOSIS — E1165 Type 2 diabetes mellitus with hyperglycemia: Secondary | ICD-10-CM

## 2020-01-29 DIAGNOSIS — G2581 Restless legs syndrome: Secondary | ICD-10-CM

## 2020-01-29 DIAGNOSIS — Z794 Long term (current) use of insulin: Secondary | ICD-10-CM

## 2020-01-30 MED ORDER — ROPINIROLE HCL 2 MG PO TABS: ORAL_TABLET | ORAL | 1 refills | Status: DC

## 2020-01-30 MED ORDER — METFORMIN HCL ER 500 MG PO TB24: 1000.0000 mg | ORAL_TABLET | Freq: Two times a day (BID) | ORAL | 1 refills | Status: DC

## 2020-02-01 ENCOUNTER — Encounter: Payer: Self-pay | Admitting: Family Medicine

## 2020-02-01 ENCOUNTER — Ambulatory Visit (INDEPENDENT_AMBULATORY_CARE_PROVIDER_SITE_OTHER): Payer: PRIVATE HEALTH INSURANCE | Admitting: Family Medicine

## 2020-02-01 ENCOUNTER — Other Ambulatory Visit: Payer: Self-pay

## 2020-02-01 VITALS — BP 122/62 | HR 95 | Temp 98.0°F | Ht 62.0 in | Wt 210.1 lb

## 2020-02-01 DIAGNOSIS — E1165 Type 2 diabetes mellitus with hyperglycemia: Secondary | ICD-10-CM

## 2020-02-01 DIAGNOSIS — Z23 Encounter for immunization: Secondary | ICD-10-CM | POA: Diagnosis not present

## 2020-02-01 DIAGNOSIS — Z79899 Other long term (current) drug therapy: Secondary | ICD-10-CM

## 2020-02-01 DIAGNOSIS — Z794 Long term (current) use of insulin: Secondary | ICD-10-CM

## 2020-02-01 DIAGNOSIS — E039 Hypothyroidism, unspecified: Secondary | ICD-10-CM

## 2020-02-01 DIAGNOSIS — F988 Other specified behavioral and emotional disorders with onset usually occurring in childhood and adolescence: Secondary | ICD-10-CM

## 2020-02-01 MED ORDER — HUMALOG KWIKPEN 100 UNIT/ML ~~LOC~~ SOPN
PEN_INJECTOR | SUBCUTANEOUS | 0 refills | Status: DC
Start: 2020-02-01 — End: 2020-03-05

## 2020-02-01 MED ORDER — METHYLPHENIDATE HCL ER (LA) 20 MG PO CP24
20.0000 mg | ORAL_CAPSULE | ORAL | 0 refills | Status: DC
Start: 2020-02-01 — End: 2020-08-15

## 2020-02-01 MED ORDER — METHYLPHENIDATE HCL ER (LA) 20 MG PO CP24
20.0000 mg | ORAL_CAPSULE | ORAL | 0 refills | Status: DC
Start: 2020-04-01 — End: 2020-03-07

## 2020-02-01 MED ORDER — METHYLPHENIDATE HCL 20 MG PO TABS: ORAL_TABLET | ORAL | 0 refills | Status: DC

## 2020-02-01 MED ORDER — METHYLPHENIDATE HCL 20 MG PO TABS
ORAL_TABLET | ORAL | 0 refills | Status: DC
Start: 2020-02-01 — End: 2020-08-15

## 2020-02-01 MED ORDER — METHYLPHENIDATE HCL ER (LA) 20 MG PO CP24
20.0000 mg | ORAL_CAPSULE | ORAL | 0 refills | Status: DC
Start: 2020-03-02 — End: 2020-03-07

## 2020-02-01 NOTE — Patient Instructions (Addendum)
Give [place] 2-3 business days to get the results of your labs back.   Someone will send you a kit in the mail for your Cologard in the next few weeks.  Keep the diet clean and stay active.  Let us know if you need anything.

## 2020-02-01 NOTE — Progress Notes (Signed)
Subjective:   Chief Complaint  Patient presents with  . Follow-up    Cristina Burns is a 61 y.o. female here for follow-up of diabetes.   Cristina Burns's self monitored glucose range is 200's.  Patient denies hypoglycemic reactions. She checks her glucose levels 1 time(s) per day. Patient does require insulin. Lantus 68 u BID, Humalog 7 u +SS w meals TID   Medications include: Amaryl 4 mg bid, Metformin XR 1000 mg bid Diet is sometimes healthy.  Exercise: none No chest pain or shortness of breath.  Hypothyroidism Patient presents for follow-up of hypothyroidism.  Reports compliance with medication. Current symptoms include: denies fatigue, weight changes, heat/cold intolerance, bowel/skin changes or CVS symptoms She believes her dose should be unchanged  ADHD The patient has a history of ADHD for which he takes Ritalin 20 mg long-acting in the morning and 20 mg after lunch.  She reports no adverse effects and reports compliance.  She feels her dose is effective.  Denies unexplained weight loss, decreased appetite, twitching, insomnia, or palpitations.  Past Medical History:  Diagnosis Date  . ADHD (attention deficit hyperactivity disorder)   . Allergic rhinitis   . Allergy   . Diabetes mellitus without complication ([place])   . Diabetic neuropathy ([place])   . GERD (gastroesophageal reflux disease)   . Hypertension   . Hypothyroidism   . RLS (restless legs syndrome)   . Stomach ulcer "it's been a long time"     Related testing: Retinal exam: Done Pneumovax: done  Objective:  BP 122/62 (BP Location: Left Arm, Patient Position: Sitting, Cuff Size: Normal)   Pulse 95   Temp 98 F (36.7 C) (Oral)   Ht 5\' 2"  (1.575 m)   Wt 210 lb 2 oz (95.3 kg)   SpO2 98%   BMI 38.43 kg/m  General:  Well developed, well nourished, in no apparent distress Skin:  Warm, no pallor or diaphoresis Lungs:  CTAB, no access msc use Cardio:  RRR, no bruits, 2+ pitting bilateral LE edema Psych: Age  appropriate judgment and insight  Assessment:   Type 2 diabetes mellitus with hyperglycemia, with long-term current use of insulin (HCC) - Plan: CBC, Comprehensive metabolic panel, Lipid panel, Hemoglobin A1c, Microalbumin / creatinine urine ratio, HUMALOG KWIKPEN 100 UNIT/ML KwikPen  RLS (restless legs syndrome) - Plan: TSH, T4, free  Hypothyroidism, unspecified type  Need for influenza vaccination - Plan: Flu Vaccine QUAD 6+ mos PF IM (Fluarix Quad PF)   Plan:   1. Cont Lantus 68 u bid. Increase Humalog to 10 units TID w SS 1 u/8 glucose points >150. Cont Metformin XR 1000 mg bid, Amaryl 4 mg bid. Could not afford GLP-1 and SGLT-2 inhibitors. Actos made her swell. Counseled on diet and exercise. 2. Cont Synthroid, ck labs.  3. Cont Ritalin. CSC updated today.  UDS updated today as well. F/u in 3-6 mo pending above. The patient voiced understanding and agreement to the plan.  [place], DO 02/01/20 3:53 PM

## 2020-02-02 LAB — LIPID PANEL
Cholesterol: 123 mg/dL (ref 0–200)
HDL: 39.9 mg/dL (ref >39.00)
LDL Cholesterol: 45 mg/dL (ref 0–99)
NonHDL: 82.77
Total CHOL/HDL Ratio: 3
Triglycerides: 188 mg/dL — ABNORMAL HIGH (ref 0.0–149.0)
VLDL: 37.6 mg/dL (ref 0.0–40.0)

## 2020-02-02 LAB — MICROALBUMIN / CREATININE URINE RATIO
Creatinine,U: 132.2 mg/dL
Microalb Creat Ratio: 2.7 mg/g (ref 0.0–30.0)
Microalb, Ur: 3.6 mg/dL — ABNORMAL HIGH (ref 0.0–1.9)

## 2020-02-02 LAB — COMPREHENSIVE METABOLIC PANEL
ALT: 17 U/L (ref 0–35)
AST: 18 U/L (ref 0–37)
Albumin: 4.6 g/dL (ref 3.5–5.2)
Alkaline Phosphatase: 131 U/L — ABNORMAL HIGH (ref 39–117)
BUN: 22 mg/dL (ref 6–23)
CO2: 28 mEq/L (ref 19–32)
Calcium: 10.2 mg/dL (ref 8.4–10.5)
Chloride: 104 mEq/L (ref 96–112)
Creatinine, Ser: 0.9 mg/dL (ref 0.40–1.20)
GFR: 69.98 mL/min (ref >60.00)
Glucose, Bld: 80 mg/dL (ref 70–99)
Potassium: 3.8 mEq/L (ref 3.5–5.1)
Sodium: 141 mEq/L (ref 135–145)
Total Bilirubin: 0.4 mg/dL (ref 0.2–1.2)
Total Protein: 7.6 g/dL (ref 6.0–8.3)

## 2020-02-02 LAB — CBC
HCT: 37.2 % (ref 36.0–46.0)
Hemoglobin: 12 g/dL (ref 12.0–15.0)
MCHC: 32.2 g/dL (ref 30.0–36.0)
MCV: 75.1 fl — ABNORMAL LOW (ref 78.0–100.0)
Platelets: 436 10*3/uL — ABNORMAL HIGH (ref 150.0–400.0)
RBC: 4.96 Mil/uL (ref 3.87–5.11)
RDW: 17.1 % — ABNORMAL HIGH (ref 11.5–15.5)
WBC: 7.7 10*3/uL (ref 4.0–10.5)

## 2020-02-02 LAB — TSH: TSH: 1.87 u[IU]/mL (ref 0.35–4.50)

## 2020-02-02 LAB — HEMOGLOBIN A1C: Hgb A1c MFr Bld: 9.5 % — ABNORMAL HIGH (ref 4.6–6.5)

## 2020-02-02 LAB — T4, FREE: Free T4: 1.22 ng/dL (ref 0.60–1.60)

## 2020-02-03 ENCOUNTER — Other Ambulatory Visit (INDEPENDENT_AMBULATORY_CARE_PROVIDER_SITE_OTHER): Payer: PRIVATE HEALTH INSURANCE

## 2020-02-03 DIAGNOSIS — R718 Other abnormality of red blood cells: Secondary | ICD-10-CM | POA: Diagnosis not present

## 2020-02-03 DIAGNOSIS — D75839 Thrombocytosis, unspecified: Secondary | ICD-10-CM | POA: Diagnosis not present

## 2020-02-03 LAB — DRUG MONITORING, PANEL 8 WITH CONFIRMATION, URINE
6 Acetylmorphine: NEGATIVE ng/mL (ref <10)
Alcohol Metabolites: NEGATIVE ng/mL
Amphetamines: NEGATIVE ng/mL (ref <500)
Benzodiazepines: NEGATIVE ng/mL (ref <100)
Buprenorphine, Urine: NEGATIVE ng/mL (ref <5)
Cocaine Metabolite: NEGATIVE ng/mL (ref <150)
Creatinine: 132.8 mg/dL
MDMA: NEGATIVE ng/mL (ref <500)
Marijuana Metabolite: NEGATIVE ng/mL (ref <20)
Opiates: NEGATIVE ng/mL (ref <100)
Oxidant: NEGATIVE ug/mL
Oxycodone: NEGATIVE ng/mL (ref <100)
pH: 5.6 (ref 4.5–9.0)

## 2020-02-03 LAB — IBC + FERRITIN
Ferritin: 5.3 ng/mL — ABNORMAL LOW (ref 10.0–291.0)
Iron: 21 ug/dL — ABNORMAL LOW (ref 42–145)
Saturation Ratios: 3.7 % — ABNORMAL LOW (ref 20.0–50.0)
Transferrin: 407 mg/dL — ABNORMAL HIGH (ref 212.0–360.0)

## 2020-02-03 LAB — DM TEMPLATE

## 2020-02-07 ENCOUNTER — Ambulatory Visit: Payer: PRIVATE HEALTH INSURANCE | Admitting: Family Medicine

## 2020-02-10 ENCOUNTER — Other Ambulatory Visit: Payer: Self-pay | Admitting: Family Medicine

## 2020-02-10 DIAGNOSIS — E1165 Type 2 diabetes mellitus with hyperglycemia: Secondary | ICD-10-CM

## 2020-02-10 DIAGNOSIS — Z794 Long term (current) use of insulin: Secondary | ICD-10-CM

## 2020-02-13 ENCOUNTER — Other Ambulatory Visit: Payer: Self-pay | Admitting: Family Medicine

## 2020-02-13 DIAGNOSIS — R109 Unspecified abdominal pain: Secondary | ICD-10-CM

## 2020-02-29 ENCOUNTER — Other Ambulatory Visit: Payer: Self-pay | Admitting: Family Medicine

## 2020-02-29 DIAGNOSIS — R109 Unspecified abdominal pain: Secondary | ICD-10-CM

## 2020-03-05 ENCOUNTER — Other Ambulatory Visit: Payer: Self-pay | Admitting: Family Medicine

## 2020-03-05 DIAGNOSIS — Z794 Long term (current) use of insulin: Secondary | ICD-10-CM

## 2020-03-05 DIAGNOSIS — E1165 Type 2 diabetes mellitus with hyperglycemia: Secondary | ICD-10-CM

## 2020-03-05 MED ORDER — HUMALOG KWIKPEN 100 UNIT/ML ~~LOC~~ SOPN: PEN_INJECTOR | SUBCUTANEOUS | 3 refills | Status: DC

## 2020-03-07 ENCOUNTER — Other Ambulatory Visit: Payer: Self-pay

## 2020-03-07 ENCOUNTER — Encounter: Payer: Self-pay | Admitting: Family Medicine

## 2020-03-07 ENCOUNTER — Ambulatory Visit (INDEPENDENT_AMBULATORY_CARE_PROVIDER_SITE_OTHER): Payer: PRIVATE HEALTH INSURANCE | Admitting: Family Medicine

## 2020-03-07 VITALS — BP 120/78 | HR 84 | Temp 98.9°F | Ht 62.0 in | Wt 215.2 lb

## 2020-03-07 DIAGNOSIS — G2581 Restless legs syndrome: Secondary | ICD-10-CM | POA: Diagnosis not present

## 2020-03-07 DIAGNOSIS — E611 Iron deficiency: Secondary | ICD-10-CM

## 2020-03-07 DIAGNOSIS — E1165 Type 2 diabetes mellitus with hyperglycemia: Secondary | ICD-10-CM | POA: Diagnosis not present

## 2020-03-07 DIAGNOSIS — Z794 Long term (current) use of insulin: Secondary | ICD-10-CM

## 2020-03-07 MED ORDER — HUMALOG KWIKPEN 100 UNIT/ML ~~LOC~~ SOPN: PEN_INJECTOR | SUBCUTANEOUS | 3 refills | Status: DC

## 2020-03-07 MED ORDER — OZEMPIC (0.25 OR 0.5 MG/DOSE) 2 MG/1.5ML ~~LOC~~ SOPN: PEN_INJECTOR | SUBCUTANEOUS | 1 refills | Status: AC

## 2020-03-07 NOTE — Patient Instructions (Addendum)
If you do not hear anything about your referral in the next 1-2 weeks, call our office and ask for an update.  Keep the diet clean and stay active.  Aim to do some physical exertion for 150 minutes per week. This is typically divided into 5 days per week, 30 minutes per day. The activity should be enough to get your heart rate up. Anything is better than nothing if you have time constraints.  Call your insurance company regarding out of pocket cost for bariatric surgery.   Let us know if you need anything.

## 2020-03-07 NOTE — Progress Notes (Signed)
Chief Complaint  Patient presents with  . Follow-up    Subjective: Patient is a 60 y.o. female here for DM f/u.  Seen around 1 mo ago and A1c was 9.5%, up from 7.2%. She is taking Amaryl 4 mg bid, Lanus 68 u bid, Humalog 10 u TID w sliding scale of 1/8 units per carbs, metformin XR 1000 mg bid. Her diet is poor. She does not exercise routinely. Sugars have been high 200's-low 300's.   Past Medical History:  Diagnosis Date  . ADHD (attention deficit hyperactivity disorder)   . Allergic rhinitis   . Allergy   . Diabetes mellitus without complication ([place])   . Diabetic neuropathy ([place])   . GERD (gastroesophageal reflux disease)   . Hypertension   . Hypothyroidism   . RLS (restless legs syndrome)   . Stomach ulcer "it's been a long time"    Objective: BP 120/78 (BP Location: Left Arm, Patient Position: Sitting, Cuff Size: Normal)   Pulse 84   Temp 98.9 F (37.2 C) (Oral)   Ht 5\' 2"  (1.575 m)   Wt 215 lb 4 oz (97.6 kg)   SpO2 98%   BMI 39.37 kg/m  General: Awake, appears stated age HEENT: MMM, EOMi  Heart: RRR, no LE edema Lungs: CTAB, no rales, wheezes or rhonchi. No accessory muscle use Psych: Age appropriate judgment and insight, normal affect and mood  Assessment and Plan: Type 2 diabetes mellitus with hyperglycemia, with long-term current use of insulin (HCC) - Plan: HUMALOG KWIKPEN 100 UNIT/ML KwikPen, Ambulatory referral to Endocrinology  Iron deficiency  RLS (restless legs syndrome)  1. Refer endo. Needs to contact ins regarding coverage for bariatric surg. Does not want to count carbs/calories so dietician/[facility] team might not be best choice. Counseled on diet/exercise.  2. Rec'd colonoscopy rather than Cologard.  3. Tx as above.  F/u in 1 mo if not following up w endo yet.  The patient voiced understanding and agreement to the plan.  Greater than 40 minutes were spent with the patient in addition to reviewing their chart information on the same day of the  visit.   [place], DO 03/07/20  4:32 PM

## 2020-03-09 ENCOUNTER — Other Ambulatory Visit: Payer: Self-pay | Admitting: Family Medicine

## 2020-03-09 ENCOUNTER — Ambulatory Visit: Payer: PRIVATE HEALTH INSURANCE | Admitting: Family Medicine

## 2020-03-09 DIAGNOSIS — L299 Pruritus, unspecified: Secondary | ICD-10-CM

## 2020-03-20 ENCOUNTER — Other Ambulatory Visit: Payer: Self-pay | Admitting: Family Medicine

## 2020-03-20 DIAGNOSIS — Z794 Long term (current) use of insulin: Secondary | ICD-10-CM

## 2020-03-20 DIAGNOSIS — E1165 Type 2 diabetes mellitus with hyperglycemia: Secondary | ICD-10-CM

## 2020-03-25 ENCOUNTER — Other Ambulatory Visit: Payer: Self-pay | Admitting: Family Medicine

## 2020-03-25 DIAGNOSIS — E1165 Type 2 diabetes mellitus with hyperglycemia: Secondary | ICD-10-CM

## 2020-03-25 DIAGNOSIS — L299 Pruritus, unspecified: Secondary | ICD-10-CM

## 2020-03-25 DIAGNOSIS — Z794 Long term (current) use of insulin: Secondary | ICD-10-CM

## 2020-03-25 DIAGNOSIS — I10 Essential (primary) hypertension: Secondary | ICD-10-CM

## 2020-03-26 ENCOUNTER — Encounter: Payer: Self-pay | Admitting: Internal Medicine

## 2020-04-09 ENCOUNTER — Other Ambulatory Visit: Payer: Self-pay | Admitting: Family Medicine

## 2020-04-09 DIAGNOSIS — I10 Essential (primary) hypertension: Secondary | ICD-10-CM

## 2020-04-13 ENCOUNTER — Other Ambulatory Visit: Payer: Self-pay | Admitting: Family Medicine

## 2020-04-13 DIAGNOSIS — E1165 Type 2 diabetes mellitus with hyperglycemia: Secondary | ICD-10-CM

## 2020-04-13 DIAGNOSIS — Z794 Long term (current) use of insulin: Secondary | ICD-10-CM

## 2020-04-19 ENCOUNTER — Other Ambulatory Visit: Payer: Self-pay | Admitting: Family Medicine

## 2020-04-19 DIAGNOSIS — G2581 Restless legs syndrome: Secondary | ICD-10-CM

## 2020-05-08 ENCOUNTER — Other Ambulatory Visit: Payer: Self-pay

## 2020-05-08 ENCOUNTER — Ambulatory Visit (INDEPENDENT_AMBULATORY_CARE_PROVIDER_SITE_OTHER): Payer: PRIVATE HEALTH INSURANCE | Admitting: Internal Medicine

## 2020-05-08 ENCOUNTER — Encounter: Payer: Self-pay | Admitting: Internal Medicine

## 2020-05-08 VITALS — BP 124/78 | HR 108 | Ht 62.0 in | Wt 213.2 lb

## 2020-05-08 DIAGNOSIS — Z794 Long term (current) use of insulin: Secondary | ICD-10-CM

## 2020-05-08 DIAGNOSIS — E1165 Type 2 diabetes mellitus with hyperglycemia: Secondary | ICD-10-CM

## 2020-05-08 DIAGNOSIS — E1142 Type 2 diabetes mellitus with diabetic polyneuropathy: Secondary | ICD-10-CM

## 2020-05-08 DIAGNOSIS — E785 Hyperlipidemia, unspecified: Secondary | ICD-10-CM

## 2020-05-08 LAB — POCT GLYCOSYLATED HEMOGLOBIN (HGB A1C): Hemoglobin A1C: 7.8 % — AB (ref 4.0–5.6)

## 2020-05-08 LAB — POCT GLUCOSE (DEVICE FOR HOME USE): POC Glucose: 156 mg/dl — AB (ref 70–99)

## 2020-05-08 MED ORDER — OZEMPIC (0.25 OR 0.5 MG/DOSE) 2 MG/1.5ML ~~LOC~~ SOPN: 0.5000 mg | PEN_INJECTOR | SUBCUTANEOUS | 6 refills | Status: DC

## 2020-05-08 MED ORDER — DEXCOM G6 SENSOR MISC: 1.0000 | 3 refills | Status: DC

## 2020-05-08 MED ORDER — DEXCOM G6 TRANSMITTER MISC: 1.0000 | 3 refills | Status: DC

## 2020-05-08 NOTE — Patient Instructions (Signed)
-   STOP Glimepiride - Decrease Lantus  65 units twice a day  - Continue Humalog 10 units with each meal  - Decrease Ozempic 0.25 mg weekly - if nausea intolerable  - Humalog correctional insulin: ADD extra units on insulin to your meal-time Humalog  dose if your blood sugars are higher than 140. Use the scale below to help guide you:   Blood sugar before meal Number of units to inject  Less than 150 0 unit   151 -  160 1 units  161 -  170 2 units  171 -  180 3 units  181 -  190 4 units  191 -  200 5 units  201 -  210 6 units  211 -  220 7 units  221 -  230 8 units    Choose healthy, lower carb lower calorie snacks: toss salad, vegetables, cottage cheese, peanut butter, low fat cheese / string cheese, lower sodium deli meat, tuna salad or chicken salad    HOW TO TREAT LOW BLOOD SUGARS (Blood sugar LESS THAN 70 MG/DL)  Please follow the RULE OF 15 for the treatment of hypoglycemia treatment (when your (blood sugars are less than 70 mg/dL)    STEP 1: Take 15 grams of carbohydrates when your blood sugar is low, which includes:   3-4 GLUCOSE TABS  OR  3-4 OZ OF JUICE OR REGULAR SODA OR  ONE TUBE OF GLUCOSE GEL     STEP 2: RECHECK blood sugar in 15 MINUTES STEP 3: If your blood sugar is still low at the 15 minute recheck --> then, go back to STEP 1 and treat AGAIN with another 15 grams of carbohydrates.

## 2020-05-08 NOTE — Progress Notes (Signed)
Name: Cristina Burns  MRN/ DOB: [id number], 1960-11-17   Age/ Sex: 60 y.o., female    PCP: Shelda Pal, DO   Reason for Endocrinology Evaluation: Type 2 Diabetes Mellitus     Date of Initial Endocrinology Visit: 05/08/2020     PATIENT IDENTIFIER: Cristina Burns is a 60 y.o. female with a past medical history of T2DM , RLS, Hypothyroidism and HTN . The patient presented for initial endocrinology clinic visit on 05/08/2020 for consultative assistance with her diabetes management.    HPI: Cristina Burns was    Diagnosed with DM in 2000 Prior Medications tried/Intolerance: Trulicity - stomach issues , Invokana - recurrent yeast infections  Currently checking blood sugars 2 x / day.  Hypoglycemia episodes : yes             Symptoms: yes                Frequency: rare  Hemoglobin A1c has ranged from 7.2%in 2021, peaking at 10.6% in 2018. Patient required assistance for hypoglycemia:  Patient has required hospitalization within the last 1 year from hyper or hypoglycemia:   In terms of diet, the patient eats 3 meals a day, snacks occasionally , avoids sugar-sweet ned beverages   Started Ozempic 6 weeks ago , has mild nausea , no vomiting or diarrhea   HOME DIABETES REGIMEN: Metformin 500 mg XR 2 tabs BID  Glimepiride 4 mg 1 tablet BID  Lantus 68 units BID  Humalog 10 units - takes it twice a day  Ozempic 0.5 mg weekly  CF ( BG -150/8)      Statin: yes ACE-I/ARB: yes Prior Diabetic Education: yes    METER DOWNLOAD SUMMARY: Unable to download    DIABETIC COMPLICATIONS: Microvascular complications:   Neuropathy  Denies: CKD, retinopathy   Last eye exam: Completed 03/2020  Macrovascular complications:    Denies: CAD, PVD, CVA   PAST HISTORY: Past Medical History:  Past Medical History:  Diagnosis Date  . ADHD (attention deficit hyperactivity disorder)   . Allergic rhinitis   . Allergy   . Diabetes mellitus without complication ([place])   . Diabetic  neuropathy ([place])   . GERD (gastroesophageal reflux disease)   . Hypertension   . Hypothyroidism   . RLS (restless legs syndrome)   . Stomach ulcer "it's been a long time"   Past Surgical History:  Past Surgical History:  Procedure Laterality Date  . ABDOMINAL HYSTERECTOMY  2002  . ACHILLES TENDON REPAIR  2009  . CHOLECYSTECTOMY  1982  . ROTATOR CUFF REPAIR Right 2011      Social History:  reports that she quit smoking about 37 years ago. Her smoking use included cigarettes. She started smoking about 46 years ago. She smoked 2.00 packs per day. She has never used smokeless tobacco. She reports that she does not drink alcohol and does not use drugs. Family History:  Family History  Problem Relation Age of Onset  . Heart disease Father   . Heart attack Father   . Heart disease Brother        CABG  . Diabetes Brother   . Kidney disease Brother   . Cirrhosis Brother   . Hypertension Brother   . Heart attack Brother   . Heart disease Brother   . Diabetes Brother   . Heart attack Brother   . Diabetes Mother   . Breast cancer Mother   . Stroke Mother      HOME MEDICATIONS: Allergies as  of 05/08/2020      Reactions   Codeine Hives   Darvon [propoxyphene] Hives   "Darvocet"   Doxycycline    Double Vision   Erythromycin    Stomach pain   Keflex [cephalexin] Hives      Medication List       Accurate as of May 08, 2020  3:20 PM. If you have any questions, ask your nurse or doctor.        albuterol 108 (90 Base) MCG/ACT inhaler Commonly known as: VENTOLIN HFA Inhale 2 puffs into the lungs every 6 (six) hours as needed for wheezing or shortness of breath.   amLODipine 5 MG tablet Commonly known as: NORVASC Take 1 tablet by mouth once daily   atorvastatin 40 MG tablet Commonly known as: LIPITOR Take 1 tablet by mouth once daily   azelastine 0.1 % nasal spray Commonly known as: ASTELIN Place 2 sprays into both nostrils 2 (two) times daily. Use in each nostril  as directed   cholecalciferol 25 MCG (1000 UNIT) tablet Commonly known as: VITAMIN D Take 1 tablet (1,000 Units total) by mouth daily.   dicyclomine 10 MG capsule Commonly known as: BENTYL TAKE 1 CAPSULE BY MOUTH EVERY 6 HOURS AS NEEDED . APPOINTMENT REQUIRED FOR FUTURE REFILLS   Euthyrox 150 MCG tablet Generic drug: levothyroxine TAKE 1 TABLET BY MOUTH ONCE DAILY BEFORE BREAKFAST   furosemide 40 MG tablet Commonly known as: LASIX Take 1 tablet by mouth twice daily What changed: when to take this   gabapentin 600 MG tablet Commonly known as: NEURONTIN Take 1 tablet (600 mg total) by mouth 3 (three) times daily.   glimepiride 4 MG tablet Commonly known as: AMARYL Take 1 tablet by mouth twice daily   glucose blood test strip Use twice daily to check blood sugar.  DX E11.9   HumaLOG KwikPen 100 UNIT/ML KwikPen Generic drug: insulin lispro INJECT 10 UNITS SUBCUTANEOUSLY 3 TIMES DAILY WITH MEALS PLUS SLIDING SCALE (INJECT 1 UNIT PER 8 POINTS OF SUGAR BEYOND 150) MAX 90 UNITS/DAY What changed: additional instructions   insulin glargine 100 UNIT/ML injection Commonly known as: LANTUS Inject 0.68 mLs (68 Units total) into the skin 2 (two) times daily.   Insulin Pen Needle 31G X 8 MM Misc Commonly known as: ReliOn Short Pen Needles To use with flexpen   levocetirizine 5 MG tablet Commonly known as: XYZAL Take 1 tablet (5 mg total) by mouth every evening.   losartan 50 MG tablet Commonly known as: COZAAR Take 1 tablet by mouth once daily   metFORMIN 500 MG 24 hr tablet Commonly known as: GLUCOPHAGE-XR Take 2 tablets by mouth twice daily   methylphenidate 20 MG 24 hr capsule Commonly known as: RITALIN LA Take 1 capsule (20 mg total) by mouth every morning.   methylphenidate 20 MG tablet Commonly known as: Ritalin Daily after lunch.   montelukast 10 MG tablet Commonly known as: SINGULAIR Take 1 tablet (10 mg total) by mouth at bedtime.    multivitamin-iron-minerals-folic acid chewable tablet Chew 1 tablet by mouth daily.   pantoprazole 40 MG tablet Commonly known as: PROTONIX Take 1 tablet (40 mg total) by mouth 2 (two) times daily before a meal.   rOPINIRole 2 MG tablet Commonly known as: REQUIP TAKE 1 TABLET BY MOUTH ONCE DAILY IN THEN AFTERNOON AND TAKE 2 TABLETS AT BEDTIME   triamcinolone 55 MCG/ACT Aero nasal inhaler Commonly known as: Nasacort Allergy 24HR Place 2 sprays into the nose daily.   triamterene-hydrochlorothiazide  37.5-25 MG tablet Commonly known as: MAXZIDE-25 Take 1 tablet by mouth once daily        ALLERGIES: Allergies  Allergen Reactions  . Codeine Hives  . Darvon [Propoxyphene] Hives    "Darvocet"  . Doxycycline     Double Vision  . Erythromycin     Stomach pain  . Keflex [Cephalexin] Hives     REVIEW OF SYSTEMS: A comprehensive ROS was conducted with the patient and is negative except as per HPI    OBJECTIVE:   VITAL SIGNS: BP 124/78   Pulse (!) 108   Ht 5\' 2"  (1.575 m)   Wt 213 lb 4 oz (96.7 kg)   SpO2 98%   BMI 39.00 kg/m    PHYSICAL EXAM:  General: Pt appears well and is in NAD  Neck: General: Supple without adenopathy or carotid bruits. Thyroid: Thyroid size normal.  No goiter or nodules appreciated.   Lungs: Clear with good BS bilat with no rales, rhonchi, or wheezes  Heart: RRR  Abdomen: Normoactive bowel sounds, soft, nontender, without masses or organomegaly palpable  Extremities:  Lower extremities - No pretibial edema. No lesions.  Skin: Normal texture and temperature to palpation. No rash noted. No Acanthosis nigricans/skin tags. No lipohypertrophy.  Neuro: MS is good with appropriate affect, pt is alert and Ox3    DM foot exam:    DATA REVIEWED:  Lab Results  Component Value Date   HGBA1C 7.8 (A) 05/08/2020   HGBA1C 9.5 (H) 02/01/2020   HGBA1C 7.2 (H) 06/24/2019   Lab Results  Component Value Date   MICROALBUR 3.6 (H) 02/01/2020   LDLCALC  45 02/01/2020   CREATININE 0.90 02/01/2020   Lab Results  Component Value Date   MICRALBCREAT 2.7 02/01/2020  Results for Cristina Burns, Cristina Burns (MRN [id number]) as of 05/08/2020 12:57  Ref. Range 02/01/2020 16:07  Sodium Latest Ref Range: 135 - 145 mEq/L 141  Potassium Latest Ref Range: 3.5 - 5.1 mEq/L 3.8  Chloride Latest Ref Range: 96 - 112 mEq/L 104  CO2 Latest Ref Range: 19 - 32 mEq/L 28  Glucose Latest Ref Range: 70 - 99 mg/dL 80  BUN Latest Ref Range: 6 - 23 mg/dL 22  Creatinine Latest Ref Range: 0.40 - 1.20 mg/dL 0.90  Calcium Latest Ref Range: 8.4 - 10.5 mg/dL 10.2  Alkaline Phosphatase Latest Ref Range: 39 - 117 U/L 131 (H)  Albumin Latest Ref Range: 3.5 - 5.2 g/dL 4.6  AST Latest Ref Range: 0 - 37 U/L 18  ALT Latest Ref Range: 0 - 35 U/L 17  Total Protein Latest Ref Range: 6.0 - 8.3 g/dL 7.6  Total Bilirubin Latest Ref Range: 0.2 - 1.2 mg/dL 0.4  GFR Latest Ref Range: >60.00 mL/min 69.98  Total CHOL/HDL Ratio Unknown 3  Cholesterol Latest Ref Range: 0 - 200 mg/dL 123  HDL Cholesterol Latest Ref Range: >39.00 mg/dL 39.90  LDL (calc) Latest Ref Range: 0 - 99 mg/dL 45  MICROALB/CREAT RATIO Latest Ref Range: 0.0 - 30.0 mg/g 2.7  NonHDL Unknown 82.77  Triglycerides Latest Ref Range: 0.0 - 149.0 mg/dL 188.0 (H)  VLDL Latest Ref Range: 0.0 - 40.0 mg/dL 37.6    ASSESSMENT / PLAN / RECOMMENDATIONS:   1) Type 2 Diabetes Mellitus, Sub-Optimally  controlled, With Neuropathic  complications - Most recent A1c of 7.8  %. Goal A1c < 7.0 %.    Plan: GENERAL: I have discussed with the patient the pathophysiology of diabetes. We went over the natural progression of the  disease. We talked about both insulin resistance and insulin deficiency. We stressed the importance of lifestyle changes including diet and exercise. I explained the complications associated with diabetes including retinopathy, nephropathy, neuropathy as well as increased risk of cardiovascular disease. We went over the benefit  seen with glycemic control.    I explained to the patient that diabetic patients are at higher than normal risk for amputations.   Pt encouraged to check glucose and use prandial insulin every time she eats , she typically skips lunch prandial dose  Discussed pharmacokinetics of basal/bolus insulin and the importance of taking prandial insulin with meals.   We also discussed avoiding sugar-sweetened beverages and snacks, when possible.   Will prescribe dexcom   She will be provided with a correction scale   We also discussed reducing the Ozempic to 0.25 mg weekly if nausea is not resolving or worsening   MEDICATIONS: - STOP Glimepiride - Continue Metformin 500 mg 2 tabs  BID  - Decrease Lantus  65 units twice a day  - Continue Humalog 10 units with each meal  - Decrease Ozempic 0.25 mg weekly - if nausea intolerable - CF : Humalog ( BG -130/20)   EDUCATION / INSTRUCTIONS:  BG monitoring instructions: Patient is instructed to check her blood sugars 3 times a day, before meals .  Call [facility] if: BG persistently < 70  . I reviewed the Rule of 15 for the treatment of hypoglycemia in detail with the patient. Literature supplied.   2) Diabetic complications:   Eye: Does not  have known diabetic retinopathy.   Neuro/ Feet: Does  have known diabetic peripheral neuropathy.  Renal: Patient does not have known baseline CKD. She is  on an ACEI/ARB at present.      3) Lipids: Patient is on Atorvastatin 40 mg daily . Discussed cardiovascular benefits of statins       Signed electronically by: Mack Guise, MD  [facility]  [address] [address] [place], [place] [address] Phone: [phone] FAX: [phone]   CC: Shelda Pal, [address] [place] [address] Phone: [phone]  Fax: [phone]    Return to Endocrinology clinic as below: No future  appointments.

## 2020-05-09 DIAGNOSIS — E785 Hyperlipidemia, unspecified: Secondary | ICD-10-CM | POA: Insufficient documentation

## 2020-05-09 DIAGNOSIS — Z794 Long term (current) use of insulin: Secondary | ICD-10-CM | POA: Insufficient documentation

## 2020-05-09 DIAGNOSIS — E1142 Type 2 diabetes mellitus with diabetic polyneuropathy: Secondary | ICD-10-CM | POA: Insufficient documentation

## 2020-05-09 MED ORDER — METFORMIN HCL ER 500 MG PO TB24: 1000.0000 mg | ORAL_TABLET | Freq: Two times a day (BID) | ORAL | 1 refills | Status: DC

## 2020-05-09 MED ORDER — INSULIN GLARGINE 100 UNIT/ML ~~LOC~~ SOLN: 65.0000 [IU] | Freq: Two times a day (BID) | SUBCUTANEOUS | 1 refills | Status: DC

## 2020-05-09 MED ORDER — HUMALOG KWIKPEN 100 UNIT/ML ~~LOC~~ SOPN
PEN_INJECTOR | SUBCUTANEOUS | 1 refills | Status: DC
Start: 2020-05-09 — End: 2020-10-16

## 2020-05-09 MED ORDER — INSULIN PEN NEEDLE 30G X 5 MM MISC: 1.0000 | Freq: Four times a day (QID) | 1 refills | Status: DC

## 2020-05-27 ENCOUNTER — Other Ambulatory Visit: Payer: Self-pay | Admitting: Family Medicine

## 2020-06-01 ENCOUNTER — Telehealth: Payer: Self-pay | Admitting: Family Medicine

## 2020-06-01 NOTE — Telephone Encounter (Signed)
[name] ([place]) Call back # [phone]  Checking on the status of prior authorization for DEXCOM G6 TRANSMITTER , Cristina Burns

## 2020-06-04 NOTE — Telephone Encounter (Signed)
Submitted PA for Dexcom G6 recevier, transmitter, and sensors to [employer].

## 2020-06-07 ENCOUNTER — Other Ambulatory Visit: Payer: Self-pay | Admitting: Family Medicine

## 2020-06-07 NOTE — Telephone Encounter (Signed)
Who Is Calling Physician / Provider / Hospital Call Type Provider Call Message Only Reason for Call Request to send message to Office Initial Comment Caller states she is calling from [facility] start program. She is trying to check the status of a prior authorization. Her name is Shan Levans and her call back number is [phone]. Pt name is Cristina Burns, DOB 0715/1962. Additional Comment Office hours provided. Doc is Dr. Martie Lee, who is not listed but office address was confirmed. Disp. Time Disposition Final User 06/06/2020 5:08:12 PM General Information Provided Yes Susette Racer

## 2020-06-07 NOTE — Telephone Encounter (Signed)
On CoverMyMeds, it is still stating send to plan. No update since it was sent

## 2020-06-13 ENCOUNTER — Other Ambulatory Visit: Payer: Self-pay | Admitting: Family Medicine

## 2020-06-19 NOTE — Telephone Encounter (Signed)
Alex with [employer] called to follow up on prior auth for the Dexcom G6 for patient.  She stated insurance still needs some additional information to complete the prior auth.  Alex's callback # is [phone].

## 2020-06-21 NOTE — Telephone Encounter (Signed)
Alex ([facility]) Call back [phone]  Checking the status of documentation.

## 2020-06-25 ENCOUNTER — Encounter: Payer: Self-pay | Admitting: Internal Medicine

## 2020-06-25 NOTE — Telephone Encounter (Signed)
Please advise 

## 2020-06-25 NOTE — Telephone Encounter (Signed)
Spoken to [place] and inform them that I have submitted the prior authorizations 2 times already. I was told that patient just needs to contact them.

## 2020-06-27 ENCOUNTER — Other Ambulatory Visit: Payer: Self-pay | Admitting: Family Medicine

## 2020-06-27 DIAGNOSIS — G2581 Restless legs syndrome: Secondary | ICD-10-CM

## 2020-06-27 DIAGNOSIS — Z794 Long term (current) use of insulin: Secondary | ICD-10-CM

## 2020-07-05 ENCOUNTER — Other Ambulatory Visit: Payer: Self-pay | Admitting: Family Medicine

## 2020-07-05 DIAGNOSIS — L299 Pruritus, unspecified: Secondary | ICD-10-CM

## 2020-07-10 ENCOUNTER — Telehealth: Payer: Self-pay | Admitting: Pharmacy Technician

## 2020-07-10 NOTE — Telephone Encounter (Addendum)
Patient Advocate Encounter   Received notification from [place] that prior authorization for [place] is required.   PA submitted on 07/10/2020 Key [id number] Status is NOT NEEDED  Pharmacy needed to run it for 3 for 30 days, not 9 for 90 days.    [facility] will continue to follow.   Venida Jarvis. Nadara Mustard, CPhT Patient Advocate [facility] Phone: [phone] Fax:  [phone]

## 2020-07-16 ENCOUNTER — Ambulatory Visit: Payer: PRIVATE HEALTH INSURANCE | Admitting: Family Medicine

## 2020-07-31 ENCOUNTER — Other Ambulatory Visit: Payer: Self-pay | Admitting: Family Medicine

## 2020-07-31 DIAGNOSIS — E1165 Type 2 diabetes mellitus with hyperglycemia: Secondary | ICD-10-CM

## 2020-07-31 DIAGNOSIS — G2581 Restless legs syndrome: Secondary | ICD-10-CM

## 2020-07-31 DIAGNOSIS — Z794 Long term (current) use of insulin: Secondary | ICD-10-CM

## 2020-08-14 ENCOUNTER — Encounter: Payer: PRIVATE HEALTH INSURANCE | Admitting: Family Medicine

## 2020-08-15 ENCOUNTER — Ambulatory Visit (INDEPENDENT_AMBULATORY_CARE_PROVIDER_SITE_OTHER): Payer: PRIVATE HEALTH INSURANCE | Admitting: Family Medicine

## 2020-08-15 ENCOUNTER — Encounter: Payer: Self-pay | Admitting: Family Medicine

## 2020-08-15 ENCOUNTER — Other Ambulatory Visit: Payer: Self-pay

## 2020-08-15 VITALS — BP 130/68 | HR 84 | Temp 98.2°F | Ht 62.0 in | Wt 210.2 lb

## 2020-08-15 DIAGNOSIS — Z794 Long term (current) use of insulin: Secondary | ICD-10-CM | POA: Diagnosis not present

## 2020-08-15 DIAGNOSIS — E1165 Type 2 diabetes mellitus with hyperglycemia: Secondary | ICD-10-CM

## 2020-08-15 DIAGNOSIS — Z0000 Encounter for general adult medical examination without abnormal findings: Secondary | ICD-10-CM

## 2020-08-15 DIAGNOSIS — F988 Other specified behavioral and emotional disorders with onset usually occurring in childhood and adolescence: Secondary | ICD-10-CM

## 2020-08-15 MED ORDER — METHYLPHENIDATE HCL ER (LA) 20 MG PO CP24: 20.0000 mg | ORAL_CAPSULE | ORAL | 0 refills | Status: DC

## 2020-08-15 MED ORDER — METHYLPHENIDATE HCL 20 MG PO TABS: ORAL_TABLET | ORAL | 0 refills | Status: DC

## 2020-08-15 NOTE — Patient Instructions (Addendum)
Give [place] 2-3 business days to get the results of your labs back.   Keep the diet clean and stay active.  Aim to do some physical exertion for 150 minutes per week. This is typically divided into 5 days per week, 30 minutes per day. The activity should be enough to get your heart rate up. Anything is better than nothing if you have time constraints.  The new Shingrix vaccine (for shingles) is a 2 shot series. It can make people feel low energy, achy and almost like they have the flu for 48 hours after injection. Please plan accordingly when deciding on when to get this shot. Call our office for a nurse visit appointment to get this. The second shot of the series is less severe regarding the side effects, but it still lasts 48 hours.   Please schedule your mammogram.  Let us know if you need anything.

## 2020-08-15 NOTE — Progress Notes (Signed)
Chief Complaint  Patient presents with   Annual Exam     Well Woman Cristina Burns is here for a complete physical.   Her last physical was >1 year ago.  Current diet: in general, diet has been better Current exercise: none. Weight is stable and she denies fatigue out of ordinary. Seatbelt? Yes  Health Maintenance Mammogram- No Colon cancer screening-Yes Shingrix- No Tetanus- Yes Hep C screening- Yes HIV screening- Yes  Past Medical History:  Diagnosis Date   ADHD (attention deficit hyperactivity disorder)    Allergic rhinitis    Allergy    Diabetes mellitus without complication (HCC)    Diabetic neuropathy (HCC)    GERD (gastroesophageal reflux disease)    Hypertension    Hypothyroidism    RLS (restless legs syndrome)    Stomach ulcer "it's been a long time"     Past Surgical History:  Procedure Laterality Date   ABDOMINAL HYSTERECTOMY  2002   ACHILLES TENDON REPAIR  2009   [place] Right 2011    Medications  Current Outpatient Medications on File Prior to Visit  Medication Sig Dispense Refill   albuterol (VENTOLIN HFA) 108 (90 Base) MCG/ACT inhaler Inhale 2 puffs into the lungs every 6 (six) hours as needed for wheezing or shortness of breath. 8 g 0   amLODipine (NORVASC) 5 MG tablet Take 1 tablet by mouth once daily 90 tablet 0   atorvastatin (LIPITOR) 40 MG tablet Take 1 tablet by mouth once daily 90 tablet 0   azelastine (ASTELIN) 0.1 % nasal spray Place 2 sprays into both nostrils 2 (two) times daily. Use in each nostril as directed 30 mL 12   cholecalciferol (VITAMIN D) 1000 units tablet Take 1 tablet (1,000 Units total) by mouth daily. 30 tablet 0   Continuous Blood Gluc Sensor (DEXCOM G6 SENSOR) MISC 1 Device by Does not apply route as directed. 9 each 3   Continuous Blood Gluc Transmit (DEXCOM G6 TRANSMITTER) MISC 1 Device by Does not apply route as directed. 1 each 3   dicyclomine (BENTYL) 10 MG capsule TAKE 1 CAPSULE  BY MOUTH EVERY 6 HOURS AS NEEDED . APPOINTMENT REQUIRED FOR FUTURE REFILLS 60 capsule 0   furosemide (LASIX) 40 MG tablet Take 1 tablet by mouth twice daily (Patient taking differently: Take 40 mg by mouth daily.) 60 tablet 0   gabapentin (NEURONTIN) 600 MG tablet TAKE 1 TABLET BY MOUTH THREE TIMES DAILY 90 tablet 0   glucose blood test strip Use twice daily to check blood sugar.  DX E11.9     HUMALOG KWIKPEN 100 UNIT/ML KwikPen Max daily 60 units 45 mL 1   insulin glargine (LANTUS) 100 UNIT/ML injection Inject 0.65 mLs (65 Units total) into the skin 2 (two) times daily. 120 mL 1   Insulin Pen Needle 30G X 5 MM MISC 1 Device by Does not apply route in the morning, at noon, in the evening, and at bedtime. 400 each 1   levocetirizine (XYZAL) 5 MG tablet Take 1 tablet (5 mg total) by mouth every evening. 90 tablet 1   levothyroxine (EUTHYROX) 150 MCG tablet Take 1 tablet (150 mcg total) by mouth daily before breakfast. 90 tablet 1   losartan (COZAAR) 50 MG tablet Take 1 tablet by mouth once daily 30 tablet 0   metFORMIN (GLUCOPHAGE-XR) 500 MG 24 hr tablet Take 2 tablets (1,000 mg total) by mouth 2 (two) times daily. 360 tablet 1   methylphenidate (RITALIN  LA) 20 MG 24 hr capsule Take 1 capsule (20 mg total) by mouth every morning. 30 capsule 0   methylphenidate (RITALIN) 20 MG tablet Daily after lunch. 30 tablet 0   montelukast (SINGULAIR) 10 MG tablet TAKE 1 TABLET BY MOUTH AT BEDTIME 90 tablet 0   multivitamin-iron-minerals-folic acid (CENTRUM) chewable tablet Chew 1 tablet by mouth daily.     pantoprazole (PROTONIX) 40 MG tablet Take 1 tablet (40 mg total) by mouth 2 (two) times daily before a meal. 60 tablet 0   rOPINIRole (REQUIP) 2 MG tablet TAKE 1 TABLET BY MOUTH ONCE DAILY IN THE AFTERNOON AND 2 AT BEDTIME 90 tablet 0   Semaglutide,0.25 or 0.'5MG'$ /DOS, (OZEMPIC, 0.25 OR 0.5 MG/DOSE,) 2 MG/1.5ML SOPN Inject 0.5 mg into the skin once a week. 1.5 mL 6   triamcinolone (NASACORT ALLERGY 24HR) 55  MCG/ACT AERO nasal inhaler Place 2 sprays into the nose daily. 1 Inhaler 2   triamterene-hydrochlorothiazide (MAXZIDE-25) 37.5-25 MG tablet Take 1 tablet by mouth once daily 90 tablet 1   Allergies Allergies  Allergen Reactions   Codeine Hives   Darvon [Propoxyphene] Hives    "Darvocet"   Doxycycline     Double Vision   Erythromycin     Stomach pain   Keflex [Cephalexin] Hives    Review of Systems: Constitutional:  no unexpected weight changes Eye:  no recent significant change in vision Ear/Nose/Mouth/Throat:  Ears:  no recent change in hearing Nose/Mouth/Throat:  no complaints of nasal congestion, no sore throat Cardiovascular: no chest pain Respiratory:  no shortness of breath Gastrointestinal:  no abdominal pain, no change in bowel habits GU:  Female: negative for dysuria or pelvic pain Musculoskeletal/Extremities:  no pain of the joints Integumentary (Skin/Breast):  no abnormal skin lesions reported Neurologic:  no headaches Endocrine:  denies fatigue  Exam BP 130/68   Pulse 84   Temp 98.2 F (36.8 C) (Oral)   Ht '5\' 2"'$  (1.575 m)   Wt 210 lb 4 oz (95.4 kg)   SpO2 99%   BMI 38.46 kg/m  General:  well developed, well nourished, in no apparent distress Skin:  no significant moles, warts, or growths Head:  no masses, lesions, or tenderness Eyes:  pupils equal and round, sclera anicteric without injection Ears:  canals without lesions, TMs shiny without retraction, no obvious effusion, no erythema Nose:  nares patent, septum midline, mucosa normal, and no drainage or sinus tenderness Throat/Pharynx:  lips and gingiva without lesion; tongue and uvula midline; non-inflamed pharynx; no exudates or postnasal drainage Neck: neck supple without adenopathy, thyromegaly, or masses Lungs:  clear to auscultation, breath sounds equal bilaterally, no respiratory distress Cardio:  regular rate and rhythm, 2+ pitting lower extremity edema tapering at the knees bilaterally Abdomen:   abdomen soft, nontender, moderately distended; bowel sounds normal; no masses or organomegaly Genital: Defer to GYN Musculoskeletal:  symmetrical muscle groups noted without atrophy or deformity Extremities:  no clubbing, cyanosis, or edema, no deformities, no skin discoloration Neuro:  gait normal; deep tendon reflexes 0/4 throughout Psych: well oriented with normal range of affect and appropriate judgment/insight  Assessment and Plan  Well adult exam  Type 2 diabetes mellitus with hyperglycemia, with long-term current use of insulin (HCC) - Plan: CBC, Comprehensive metabolic panel, Lipid panel  Attention deficit disorder, unspecified hyperactivity presence - Plan: methylphenidate (RITALIN) 20 MG tablet, methylphenidate (RITALIN LA) 20 MG 24 hr capsule   Well 60 y.o. female. Counseled on diet and exercise. Second COVID-vaccine booster and Shingrix recommended.  She plans on getting the second booster, she will think about the Shingrix. I also recommended she set up her mammogram which she seems more reluctant to do. Other orders as above. She is now following with endocrinology for her diabetes.  A1c has improved to 7.8.  She has a follow-up shortly. Follow up 6 months for medication check or as needed. The patient voiced understanding and agreement to the plan.  [place], DO 08/15/20 4:30 PM

## 2020-08-16 LAB — LIPID PANEL
Cholesterol: 128 mg/dL (ref 0–200)
HDL: 36 mg/dL — ABNORMAL LOW (ref >39.00)
NonHDL: 92.02
Total CHOL/HDL Ratio: 4
Triglycerides: 201 mg/dL — ABNORMAL HIGH (ref 0.0–149.0)
VLDL: 40.2 mg/dL — ABNORMAL HIGH (ref 0.0–40.0)

## 2020-08-16 LAB — COMPREHENSIVE METABOLIC PANEL
ALT: 27 U/L (ref 0–35)
AST: 24 U/L (ref 0–37)
Albumin: 4.3 g/dL (ref 3.5–5.2)
Alkaline Phosphatase: 121 U/L — ABNORMAL HIGH (ref 39–117)
BUN: 24 mg/dL — ABNORMAL HIGH (ref 6–23)
CO2: 27 mEq/L (ref 19–32)
Calcium: 9.5 mg/dL (ref 8.4–10.5)
Chloride: 103 mEq/L (ref 96–112)
Creatinine, Ser: 1.07 mg/dL (ref 0.40–1.20)
GFR: 56.65 mL/min — ABNORMAL LOW (ref >60.00)
Glucose, Bld: 252 mg/dL — ABNORMAL HIGH (ref 70–99)
Potassium: 4.1 mEq/L (ref 3.5–5.1)
Sodium: 141 mEq/L (ref 135–145)
Total Bilirubin: 0.4 mg/dL (ref 0.2–1.2)
Total Protein: 7.7 g/dL (ref 6.0–8.3)

## 2020-08-16 LAB — CBC
HCT: 38 % (ref 36.0–46.0)
Hemoglobin: 12.5 g/dL (ref 12.0–15.0)
MCHC: 32.9 g/dL (ref 30.0–36.0)
MCV: 84.6 fl (ref 78.0–100.0)
Platelets: 361 10*3/uL (ref 150.0–400.0)
RBC: 4.49 Mil/uL (ref 3.87–5.11)
RDW: 15.1 % (ref 11.5–15.5)
WBC: 7.1 10*3/uL (ref 4.0–10.5)

## 2020-08-16 LAB — LDL CHOLESTEROL, DIRECT: Direct LDL: 70 mg/dL

## 2020-08-29 ENCOUNTER — Other Ambulatory Visit: Payer: Self-pay | Admitting: Family Medicine

## 2020-08-29 DIAGNOSIS — E1165 Type 2 diabetes mellitus with hyperglycemia: Secondary | ICD-10-CM

## 2020-08-29 DIAGNOSIS — Z794 Long term (current) use of insulin: Secondary | ICD-10-CM

## 2020-08-29 DIAGNOSIS — G2581 Restless legs syndrome: Secondary | ICD-10-CM

## 2020-10-03 ENCOUNTER — Other Ambulatory Visit: Payer: Self-pay | Admitting: Family Medicine

## 2020-10-03 DIAGNOSIS — Z794 Long term (current) use of insulin: Secondary | ICD-10-CM

## 2020-10-03 DIAGNOSIS — G2581 Restless legs syndrome: Secondary | ICD-10-CM

## 2020-10-03 DIAGNOSIS — E1165 Type 2 diabetes mellitus with hyperglycemia: Secondary | ICD-10-CM

## 2020-10-10 ENCOUNTER — Other Ambulatory Visit: Payer: Self-pay | Admitting: Family Medicine

## 2020-10-10 DIAGNOSIS — L299 Pruritus, unspecified: Secondary | ICD-10-CM

## 2020-10-12 ENCOUNTER — Other Ambulatory Visit: Payer: Self-pay | Admitting: Family Medicine

## 2020-10-16 ENCOUNTER — Encounter: Payer: Self-pay | Admitting: Internal Medicine

## 2020-10-16 ENCOUNTER — Ambulatory Visit (INDEPENDENT_AMBULATORY_CARE_PROVIDER_SITE_OTHER): Payer: PRIVATE HEALTH INSURANCE | Admitting: Internal Medicine

## 2020-10-16 ENCOUNTER — Other Ambulatory Visit: Payer: Self-pay

## 2020-10-16 VITALS — BP 122/70 | HR 100 | Ht 62.0 in | Wt 212.0 lb

## 2020-10-16 DIAGNOSIS — Z794 Long term (current) use of insulin: Secondary | ICD-10-CM

## 2020-10-16 DIAGNOSIS — E1142 Type 2 diabetes mellitus with diabetic polyneuropathy: Secondary | ICD-10-CM | POA: Diagnosis not present

## 2020-10-16 DIAGNOSIS — E1165 Type 2 diabetes mellitus with hyperglycemia: Secondary | ICD-10-CM

## 2020-10-16 LAB — POCT GLYCOSYLATED HEMOGLOBIN (HGB A1C): Hemoglobin A1C: 9.1 % — AB (ref 4.0–5.6)

## 2020-10-16 MED ORDER — HUMALOG KWIKPEN 100 UNIT/ML ~~LOC~~ SOPN: PEN_INJECTOR | SUBCUTANEOUS | 3 refills | Status: DC

## 2020-10-16 MED ORDER — TOUJEO MAX SOLOSTAR 300 UNIT/ML ~~LOC~~ SOPN: 65.0000 [IU] | PEN_INJECTOR | Freq: Two times a day (BID) | SUBCUTANEOUS | 3 refills | Status: DC

## 2020-10-16 NOTE — Patient Instructions (Addendum)
-   Lantus  65 units twice a day  - Increase Humalog 14 units with each meal  - Humalog correctional insulin: ADD extra units on insulin to your meal-time Humalog  dose if your blood sugars are higher than 150. Use the scale below to help guide you:   Blood sugar before meal Number of units to inject  Less than 150 0 unit   151 -  160 1 units  161 -  170 2 units  171 -  180 3 units  181 -  190 4 units  191 -  200 5 units  201 -  210 6 units  211 -  220 7 units  221 -  230 8 units      HOW TO TREAT LOW BLOOD SUGARS (Blood sugar LESS THAN 70 MG/DL) Please follow the RULE OF 15 for the treatment of hypoglycemia treatment (when your (blood sugars are less than 70 mg/dL)   STEP 1: Take 15 grams of carbohydrates when your blood sugar is low, which includes:  3-4 GLUCOSE TABS  OR 3-4 OZ OF JUICE OR REGULAR SODA OR ONE TUBE OF GLUCOSE GEL    STEP 2: RECHECK blood sugar in 15 MINUTES STEP 3: If your blood sugar is still low at the 15 minute recheck --> then, go back to STEP 1 and treat AGAIN with another 15 grams of carbohydrates.

## 2020-10-16 NOTE — Progress Notes (Signed)
Name: Cristina Burns  Age/ Sex: 60 y.o., female   MRN/ DOB: [id number], 03/27/1960     PCP: Shelda Pal, DO   Reason for Endocrinology Evaluation: Type 2 Diabetes Mellitus  Initial Endocrine Consultative Visit: 05/08/2020    PATIENT IDENTIFIER: Cristina Burns is a 60 y.o. female with a past medical history of T2DM, RLS, Hypothyroidism and HTN. The patient has followed with Endocrinology clinic since 05/08/2020 for consultative assistance with management of her diabetes.  DIABETIC HISTORY:  Cristina Burns was diagnosed with DM in 3825, Trulicity - stomach issues. Invokana- recurrent yeast infections. Her hemoglobin A1c has ranged from  7.2%in 2021, peaking at 10.6% in 2018.   On her initial visit to our clinic her A1c 7.8%, she was on Glimepiride, MDI regimen and Ozempic. We stopped Glimepiride, continued Metfomrin,MDI regimen and decreased Ozempic due to nausea    Invokana caused  yeast infection   She stopped Ozempic it by her visit in 09/2020 SUBJECTIVE:   During the last visit (05/08/2020): A1c 7.8 % . Stopped Glimepiride . Continue metformin, lantus , humalog, and decreased Ozempic      Today (10/16/2020): Cristina Burns  is here for a follow up on diabetes management. She checks her blood sugars 3 times daily . The patient has not had hypoglycemic episodes since the last clinic visit.  She stopped Ozempic due to GI side effects with mild improvement but continues with epigastric pain    Dexcom cost prohibitive  She is scared of [place] or jardiance due to yeats infection    HOME DIABETES REGIMEN:  Metformin 500 mg 2 tabs  BID  Lantus  65 units twice a day  Humalog 10 units with each meal  Ozempic 0.25 mg weekly- not taking  CF : Humalog ( BG -130/20)       Statin: yes ACE-I/ARB: yes Prior Diabetic Education: yes   METER DOWNLOAD SUMMARY: Unable to download   118- 376 mg/dL   Average BG 228      DIABETIC COMPLICATIONS: Microvascular  complications:  Neuropathy Denies: CKD, retinopathy Last Eye Exam: Completed 03/2020  Macrovascular complications:   Denies: CAD, CVA, PVD   HISTORY:  Past Medical History:  Past Medical History:  Diagnosis Date   ADHD (attention deficit hyperactivity disorder)    Allergic rhinitis    Allergy    Diabetes mellitus without complication ([place])    Diabetic neuropathy ([place])    GERD (gastroesophageal reflux disease)    Hypertension    Hypothyroidism    RLS (restless legs syndrome)    Stomach ulcer "it's been a long time"   Past Surgical History:  Past Surgical History:  Procedure Laterality Date   ABDOMINAL HYSTERECTOMY  2002   ACHILLES TENDON REPAIR  2009   CHOLECYSTECTOMY  1982   ROTATOR CUFF REPAIR Right 2011   Social History:  reports that she quit smoking about 38 years ago. Her smoking use included cigarettes. She started smoking about 47 years ago. She smoked an average of 2 packs per day. She has never used smokeless tobacco. She reports that she does not drink alcohol and does not use drugs. Family History:  Family History  Problem Relation Age of Onset   Heart disease Father    Heart attack Father    Heart disease Brother        CABG   Diabetes Brother    Kidney disease Brother    Cirrhosis Brother    Hypertension Brother    Heart attack Brother  Heart disease Brother    Diabetes Brother    Heart attack Brother    Diabetes Mother    Breast cancer Mother    Stroke Mother      HOME MEDICATIONS: Allergies as of 10/16/2020       Reactions   Codeine Hives   Darvon [propoxyphene] Hives   "Darvocet"   Doxycycline    Double Vision   Erythromycin    Stomach pain   Keflex [cephalexin] Hives        Medication List        Accurate as of October 16, 2020  3:41 PM. If you have any questions, ask your nurse or doctor.          albuterol 108 (90 Base) MCG/ACT inhaler Commonly known as: VENTOLIN HFA Inhale 2 puffs into the lungs every 6 (six)  hours as needed for wheezing or shortness of breath.   amLODipine 5 MG tablet Commonly known as: NORVASC Take 1 tablet by mouth once daily   atorvastatin 40 MG tablet Commonly known as: LIPITOR Take 1 tablet by mouth once daily   azelastine 0.1 % nasal spray Commonly known as: ASTELIN Place 2 sprays into both nostrils 2 (two) times daily. Use in each nostril as directed   cholecalciferol 25 MCG (1000 UNIT) tablet Commonly known as: VITAMIN D Take 1 tablet (1,000 Units total) by mouth daily.   Dexcom G6 Sensor Misc 1 Device by Does not apply route as directed.   Dexcom G6 Transmitter Misc 1 Device by Does not apply route as directed.   dicyclomine 10 MG capsule Commonly known as: BENTYL TAKE 1 CAPSULE BY MOUTH EVERY 6 HOURS AS NEEDED . APPOINTMENT REQUIRED FOR FUTURE REFILLS   furosemide 40 MG tablet Commonly known as: LASIX Take 1 tablet by mouth twice daily What changed: when to take this   gabapentin 600 MG tablet Commonly known as: NEURONTIN TAKE 1 TABLET BY MOUTH THREE TIMES DAILY   glucose blood test strip Use twice daily to check blood sugar.  DX E11.9   HumaLOG KwikPen 100 UNIT/ML KwikPen Generic drug: insulin lispro Max daily 60 units   insulin glargine 100 UNIT/ML injection Commonly known as: LANTUS Inject 0.65 mLs (65 Units total) into the skin 2 (two) times daily.   Insulin Pen Needle 30G X 5 MM Misc 1 Device by Does not apply route in the morning, at noon, in the evening, and at bedtime.   levocetirizine 5 MG tablet Commonly known as: XYZAL TAKE 1 TABLET BY MOUTH ONCE DAILY IN THE EVENING   levothyroxine 150 MCG tablet Commonly known as: Euthyrox Take 1 tablet (150 mcg total) by mouth daily before breakfast.   losartan 50 MG tablet Commonly known as: COZAAR Take 1 tablet by mouth once daily   metFORMIN 500 MG 24 hr tablet Commonly known as: GLUCOPHAGE-XR Take 2 tablets (1,000 mg total) by mouth 2 (two) times daily.   methylphenidate 20 MG  tablet Commonly known as: Ritalin Daily after lunch.   methylphenidate 20 MG 24 hr capsule Commonly known as: RITALIN LA Take 1 capsule (20 mg total) by mouth every morning.   methylphenidate 20 MG 24 hr capsule Commonly known as: RITALIN LA Take 1 capsule (20 mg total) by mouth every morning. Take daily after lunch.   methylphenidate 20 MG tablet Commonly known as: RITALIN Take daily after lunch.   methylphenidate 20 MG 24 hr capsule Commonly known as: RITALIN LA Take 1 capsule (20 mg total) by mouth every  morning.   methylphenidate 20 MG tablet Commonly known as: RITALIN Take 1 tab in the afternoon daily.   montelukast 10 MG tablet Commonly known as: SINGULAIR TAKE 1 TABLET BY MOUTH AT BEDTIME   multivitamin-iron-minerals-folic acid chewable tablet Chew 1 tablet by mouth daily.   Ozempic (0.25 or 0.5 MG/DOSE) 2 MG/1.5ML Sopn Generic drug: Semaglutide(0.25 or 0.5MG /DOS) Inject 0.5 mg into the skin once a week.   pantoprazole 40 MG tablet Commonly known as: PROTONIX TAKE 1 TABLET BY MOUTH TWICE DAILY BEFORE A MEAL . APPOINTMENT REQUIRED FOR FUTURE REFILLS   rOPINIRole 2 MG tablet Commonly known as: REQUIP TAKE 1 TABLET BY MOUTH IN THE AFTERNOOON AND 2 TABLETS AT BEDTIME   triamcinolone 55 MCG/ACT Aero nasal inhaler Commonly known as: Nasacort Allergy 24HR Place 2 sprays into the nose daily.   triamterene-hydrochlorothiazide 37.5-25 MG tablet Commonly known as: MAXZIDE-25 Take 1 tablet by mouth once daily         OBJECTIVE:   Vital Signs: BP 122/70 (BP Location: Left Arm, Patient Position: Sitting, Cuff Size: Small)   Pulse 100   Ht 5\' 2"  (1.575 m)   Wt 212 lb (96.2 kg)   SpO2 97%   BMI 38.78 kg/m   Wt Readings from Last 3 Encounters:  10/16/20 212 lb (96.2 kg)  08/15/20 210 lb 4 oz (95.4 kg)  05/08/20 213 lb 4 oz (96.7 kg)     Exam: General: Pt appears well and is in NAD  Lungs: Clear with good BS bilat with no rales, rhonchi, or wheezes   Heart: RRR with normal S1 and S2 and no gallops; no murmurs; no rub  Abdomen: Normoactive bowel sounds, soft, nontender, without masses or organomegaly palpable  Extremities: No pretibial edema.   Neuro: MS is good with appropriate affect, pt is alert and Ox3    DM foot exam: 10/16/2020  The skin of the feet is intact without sores or ulcerations. The pedal pulses are 2+ on right and 2+ on left. The sensation is intact to a screening 5.07, 10 gram monofilament bilaterally        DATA REVIEWED:  Lab Results  Component Value Date   HGBA1C 7.8 (A) 05/08/2020   HGBA1C 9.5 (H) 02/01/2020   HGBA1C 7.2 (H) 06/24/2019   Lab Results  Component Value Date   MICROALBUR 3.6 (H) 02/01/2020   LDLCALC 45 02/01/2020   CREATININE 1.07 08/15/2020   Lab Results  Component Value Date   MICRALBCREAT 2.7 02/01/2020     Lab Results  Component Value Date   CHOL 128 08/15/2020   HDL 36.00 (L) 08/15/2020   LDLCALC 45 02/01/2020   LDLDIRECT 70.0 08/15/2020   TRIG 201.0 (H) 08/15/2020   CHOLHDL 4 08/15/2020         ASSESSMENT / PLAN / RECOMMENDATIONS:   1) Type 2 Diabetes Mellitus, Poorly controlled, With Neuropathic  complications - Most recent A1c of 9.1 %. Goal A1c < 7.0 %.      -Patient continues with worsening glycemic control, A1c up from 7.8% -She had stopped Ozempic due to GI side effects, was unable to tolerate 0.25 mg -She is intolerant to Invokana due to recurrent yeast infections, she is scared of trying other alternatives of SGLT2 inhibitors. -I have prescribed Dexcom but this has been cost prohibitive -I am going to adjust prandial insulin as below, I have encouraged her to use correction factor -Counseled about low-carb diet and importance of improving glycemic control   MEDICATIONS:   Continue metformin  500 mg XR 2 tabs twice daily Continue Lantus 65 units twice daily Increase Humalog to 14 units with each meal Correction factor: Humalog (BG  -140/10)   EDUCATION / INSTRUCTIONS: BG monitoring instructions: Patient is instructed to check her blood sugars 3 times a day, before meals. Call [facility] if: BG persistently < 70  I reviewed the Rule of 15 for the treatment of hypoglycemia in detail with the patient. Literature supplied.    2) Diabetic complications:  Eye: Does not  have known diabetic retinopathy.  Neuro/ Feet: Does  have known diabetic peripheral neuropathy .  Renal: Patient does not have known baseline CKD. She   is  on an ACEI/ARB at present.      F/U in 4 months   Signed electronically by: Mack Guise, MD  [facility]  [facility] [address] [place], [place] [address] Phone: [phone] FAX: [phone]   CC: Shelda Pal, [address] [place] [address] Phone: [phone]  Fax: [phone]  Return to Endocrinology clinic as below: No future appointments.

## 2020-10-17 MED ORDER — METFORMIN HCL ER 500 MG PO TB24: 1000.0000 mg | ORAL_TABLET | Freq: Two times a day (BID) | ORAL | 3 refills | Status: DC

## 2020-10-29 ENCOUNTER — Other Ambulatory Visit: Payer: Self-pay | Admitting: Family Medicine

## 2020-10-29 DIAGNOSIS — E1165 Type 2 diabetes mellitus with hyperglycemia: Secondary | ICD-10-CM

## 2020-10-29 DIAGNOSIS — G2581 Restless legs syndrome: Secondary | ICD-10-CM

## 2020-10-29 DIAGNOSIS — R109 Unspecified abdominal pain: Secondary | ICD-10-CM

## 2020-10-29 DIAGNOSIS — Z794 Long term (current) use of insulin: Secondary | ICD-10-CM

## 2020-11-16 ENCOUNTER — Other Ambulatory Visit: Payer: Self-pay | Admitting: Family Medicine

## 2020-11-26 ENCOUNTER — Other Ambulatory Visit: Payer: Self-pay | Admitting: Family Medicine

## 2020-11-26 DIAGNOSIS — Z794 Long term (current) use of insulin: Secondary | ICD-10-CM

## 2020-11-26 DIAGNOSIS — E1165 Type 2 diabetes mellitus with hyperglycemia: Secondary | ICD-10-CM

## 2020-11-26 DIAGNOSIS — G2581 Restless legs syndrome: Secondary | ICD-10-CM

## 2020-12-03 ENCOUNTER — Other Ambulatory Visit: Payer: Self-pay

## 2020-12-03 ENCOUNTER — Encounter: Payer: Self-pay | Admitting: Family Medicine

## 2020-12-03 ENCOUNTER — Ambulatory Visit (INDEPENDENT_AMBULATORY_CARE_PROVIDER_SITE_OTHER): Payer: PRIVATE HEALTH INSURANCE | Admitting: Family Medicine

## 2020-12-03 VITALS — BP 122/78 | HR 94 | Temp 98.5°F | Ht 62.0 in | Wt 216.0 lb

## 2020-12-03 DIAGNOSIS — Z23 Encounter for immunization: Secondary | ICD-10-CM | POA: Diagnosis not present

## 2020-12-03 DIAGNOSIS — M7752 Other enthesopathy of left foot: Secondary | ICD-10-CM | POA: Diagnosis not present

## 2020-12-03 DIAGNOSIS — K219 Gastro-esophageal reflux disease without esophagitis: Secondary | ICD-10-CM | POA: Diagnosis not present

## 2020-12-03 MED ORDER — PANTOPRAZOLE SODIUM 40 MG PO TBEC: DELAYED_RELEASE_TABLET | ORAL | 2 refills | Status: DC

## 2020-12-03 MED ORDER — PREDNISONE 20 MG PO TABS: 40.0000 mg | ORAL_TABLET | Freq: Every day | ORAL | 0 refills | Status: AC

## 2020-12-03 NOTE — Patient Instructions (Addendum)
Keep the diet clean and stay active.  Let me know if you change your mind about a mammogram and/or seeing a gastroenterologist.  The new Shingrix vaccine (for shingles) is a 2 shot series. It can make people feel low energy, achy and almost like they have the flu for 48 hours after injection. Please plan accordingly when deciding on when to get this shot. Call our office for a nurse visit appointment to get this. The second shot of the series is less severe regarding the side effects, but it still lasts 48 hours.   Ice/cold pack over area for 10-15 min at least 3 times daily.  Let us know if you need anything.  Achilles Tendinitis Rehab It is normal to feel mild stretching, pulling, tightness, or discomfort as you do these exercises, but you should stop right away if you feel sudden pain or your pain gets worse.   Stretching and range of motion exercises These exercises warm up your muscles and joints and improve the movement and flexibility of your ankle. These exercises also help to relieve pain, numbness, and tingling. Exercise A: Standing wall calf stretch, knee straight  Stand with your hands against a wall. Extend your affected leg behind you and bend your front knee slightly. Keep both of your heels on the floor. Point the toes of your back foot slightly inward. Keeping your heels on the floor and your back knee straight, shift your weight toward the wall. Do not allow your back to arch. You should feel a gentle stretch in your calf. Hold this position for seconds. Repeat 2 times. Complete this stretch 3 times per week Exercise B: Standing wall calf stretch, knee bent Stand with your hands against a wall. Extend your affected leg behind you, and bend your front knee slightly. Keep both of your heels on the floor. Point the toes of your back foot slightly inward. Keeping your heels on the floor, unlock your back knee so that it is bent. You should feel a gentle stretch deep in your  calf. Hold this position for 30 seconds. Repeat 2 times. Complete this stretch 3 times per week.  Strengthening exercises These exercises build strength and control of your ankle. Endurance is the ability to use your muscles for a long time, even after they get tired. Exercise C: Plantar flexion with band  Sit on the floor with your affected leg extended. You may put a pillow under your calf to give your foot more room to move. Loop a rubber exercise band or tube around the ball of your affected foot. The ball of your foot is on the walking surface, right under your toes. The band or tube should be slightly tense when your foot is relaxed. If the band or tube slips, you can put on your shoe or put a washcloth between the band and your foot to help it stay in place. Slowly point your toes downward, pushing them away from you. Hold this position for 10 seconds. Slowly release the tension in the band or tube, controlling smoothly until your foot is back to the starting position. Repeat 2 times. Complete this exercise 3 times per week. Exercise D: Heel raise with eccentric lower  Stand on a step with the balls of your feet. The ball of your foot is on the walking surface, right under your toes. Do not put your heels on the step. For balance, rest your hands on the wall or on a railing. Rise up onto the  balls of your feet. Keeping your heels up, shift all of your weight to your affected leg and pick up your other leg. Slowly lower your affected leg so your heel drops below the level of the step. Put down your foot. If told by your health care provider, build up to: 3 sets of 15 repetitions while keeping your knees straight. 3 sets of 15 repetitions while keeping your knees bent as far as told by your health care provider.  Complete this exercise 3 times per week. If this exercise is too easy, try doing it while wearing a backpack with weights in it. Balance exercises These exercises improve  or maintain your balance. Balance is important in preventing falls. Exercise E: Single leg stand Without shoes, stand near a railing or in a door frame. Hold on to the railing or door frame as needed. Stand on your affected foot. Keep your big toe down on the floor and try to keep your arch lifted. Hold this position for 10 seconds. Repeat 2 times. Complete this exercise 3 times per week. If this exercise is too easy, you can try it with your eyes closed or while standing on a pillow. Make sure you discuss any questions you have with your health care provider. Document Released: 08/07/2004 Document Revised: 09/13/2015 Document Reviewed: 09/12/2014 Elsevier Interactive Patient Education  [employer].

## 2020-12-03 NOTE — Progress Notes (Signed)
Chief Complaint  Patient presents with   Follow-up    Medication     GERD Cristina Burns is a 60 y.o. female who presents for follow up of GERD. Takes Protonix 40 mg bid.  She denies fullness after meals, heartburn, bilious reflux, nocturnal burning, wt loss, N/V, bleeding, waterbrash Aggravating factors and specific triggers include large meals and lying down. Risk factors present for GERD include obesity.   She has had a couple weeks of left heel pain.  No injury or change activity but she wonders if it is related to walking more.  Slight swelling, no bruising or redness.  She has a history of Achilles tendon repair on that left heel.  It does not hurt in the calf or other areas of her Achilles tendon.  Past Medical History:  Diagnosis Date   ADHD (attention deficit hyperactivity disorder)    Allergic rhinitis    Allergy    Diabetes mellitus without complication (HCC)    Diabetic neuropathy (HCC)    GERD (gastroesophageal reflux disease)    Hypertension    Hypothyroidism    RLS (restless legs syndrome)    Stomach ulcer "it's been a long time"    Exam BP 122/78   Pulse 94   Temp 98.5 F (36.9 C) (Oral)   Ht 5\' 2"  (1.575 m)   Wt 216 lb (98 kg)   SpO2 97%   BMI 39.51 kg/m  General:  well developed, well nourished, in no apparent distress Lungs:  clear to auscultation, breath sounds equal bilaterally, no respiratory distress, no wheezes MSK: Tenderness to palpation of the L posterior calcaneus without fusiform deformity or soft tissue edema.  There is no tenderness over the Achilles tendon.  Negative Thompson test. Cardio:  regular rate and rhythm without murmurs, heart sounds without clicks or rubs Abdomen:  abdomen soft, nontender; bowel sounds normal; no masses or organomegaly Psych: Normal affect and mood  Assessment and Plan  Gastroesophageal reflux disease, unspecified whether esophagitis present - Plan: pantoprazole (PROTONIX) 40 MG tablet  Calcaneal bursitis  (heel), left - Plan: predniSONE (DELTASONE) 20 MG tablet  Need for influenza vaccination - Plan: Flu Vaccine QUAD 6+ mos PF IM (Fluarix Quad PF)  Chronic, stable. Cont Protonix 40 mg bid. Declines GI referral at this time. Counseled on GERD diet and precautions Stretches/exercises, ice, activity as tolerated.  5-day prednisone burst 40 mg daily.  She will monitor her sugars in the meantime.  Consider wearing a boot.  Will consider referral to sports medicine if no improvement. Follow up in 6 mo. Pt voiced understanding and agreement to the plan.  I spent 30 minutes with the patient discussing the above plan in addition to reviewing her chart on the same day of the visit.  [place], DO 12/03/20  4:58 PM

## 2020-12-04 ENCOUNTER — Other Ambulatory Visit: Payer: Self-pay | Admitting: Family Medicine

## 2020-12-20 ENCOUNTER — Other Ambulatory Visit: Payer: Self-pay | Admitting: Family Medicine

## 2020-12-20 DIAGNOSIS — Z794 Long term (current) use of insulin: Secondary | ICD-10-CM

## 2020-12-20 DIAGNOSIS — G2581 Restless legs syndrome: Secondary | ICD-10-CM

## 2020-12-20 DIAGNOSIS — I10 Essential (primary) hypertension: Secondary | ICD-10-CM

## 2020-12-20 DIAGNOSIS — E1165 Type 2 diabetes mellitus with hyperglycemia: Secondary | ICD-10-CM

## 2020-12-26 ENCOUNTER — Encounter: Payer: Self-pay | Admitting: Family Medicine

## 2020-12-26 DIAGNOSIS — G2581 Restless legs syndrome: Secondary | ICD-10-CM

## 2020-12-27 MED ORDER — ROPINIROLE HCL 2 MG PO TABS: ORAL_TABLET | ORAL | 0 refills | Status: DC

## 2020-12-29 ENCOUNTER — Emergency Department (HOSPITAL_BASED_OUTPATIENT_CLINIC_OR_DEPARTMENT_OTHER): Payer: 59

## 2020-12-29 ENCOUNTER — Emergency Department (HOSPITAL_BASED_OUTPATIENT_CLINIC_OR_DEPARTMENT_OTHER)
Admission: EM | Admit: 2020-12-29 | Discharge: 2020-12-29 | Disposition: A | Discharge status: Home / Self Care | Payer: 59 | Attending: Emergency Medicine | Admitting: Emergency Medicine

## 2020-12-29 ENCOUNTER — Encounter (HOSPITAL_BASED_OUTPATIENT_CLINIC_OR_DEPARTMENT_OTHER): Payer: Self-pay | Admitting: Emergency Medicine

## 2020-12-29 ENCOUNTER — Other Ambulatory Visit: Payer: Self-pay

## 2020-12-29 DIAGNOSIS — E1140 Type 2 diabetes mellitus with diabetic neuropathy, unspecified: Secondary | ICD-10-CM | POA: Insufficient documentation

## 2020-12-29 DIAGNOSIS — R0602 Shortness of breath: Secondary | ICD-10-CM | POA: Diagnosis not present

## 2020-12-29 DIAGNOSIS — R052 Subacute cough: Secondary | ICD-10-CM | POA: Insufficient documentation

## 2020-12-29 DIAGNOSIS — I10 Essential (primary) hypertension: Secondary | ICD-10-CM | POA: Insufficient documentation

## 2020-12-29 DIAGNOSIS — E1142 Type 2 diabetes mellitus with diabetic polyneuropathy: Secondary | ICD-10-CM | POA: Insufficient documentation

## 2020-12-29 DIAGNOSIS — R0789 Other chest pain: Secondary | ICD-10-CM | POA: Diagnosis present

## 2020-12-29 DIAGNOSIS — E039 Hypothyroidism, unspecified: Secondary | ICD-10-CM | POA: Diagnosis not present

## 2020-12-29 DIAGNOSIS — R072 Precordial pain: Secondary | ICD-10-CM | POA: Insufficient documentation

## 2020-12-29 DIAGNOSIS — Z87891 Personal history of nicotine dependence: Secondary | ICD-10-CM | POA: Diagnosis not present

## 2020-12-29 DIAGNOSIS — Z20822 Contact with and (suspected) exposure to covid-19: Secondary | ICD-10-CM | POA: Diagnosis not present

## 2020-12-29 LAB — COMPREHENSIVE METABOLIC PANEL
ALT: 25 U/L (ref 0–44)
AST: 25 U/L (ref 15–41)
Albumin: 3.9 g/dL (ref 3.5–5.0)
Alkaline Phosphatase: 120 U/L (ref 38–126)
Anion gap: 10 (ref 5–15)
BUN: 11 mg/dL (ref 6–20)
CO2: 28 mmol/L (ref 22–32)
Calcium: 9.4 mg/dL (ref 8.9–10.3)
Chloride: 103 mmol/L (ref 98–111)
Creatinine, Ser: 0.86 mg/dL (ref 0.44–1.00)
GFR, Estimated: >60 mL/min (ref >60)
Glucose, Bld: 85 mg/dL (ref 70–99)
Potassium: 3.6 mmol/L (ref 3.5–5.1)
Sodium: 141 mmol/L (ref 135–145)
Total Bilirubin: 0.5 mg/dL (ref 0.3–1.2)
Total Protein: 7.7 g/dL (ref 6.5–8.1)

## 2020-12-29 LAB — TROPONIN I (HIGH SENSITIVITY)
Troponin I (High Sensitivity): 4 ng/L (ref <18)
Troponin I (High Sensitivity): 4 ng/L (ref <18)

## 2020-12-29 LAB — CBC WITH DIFFERENTIAL/PLATELET
Abs Immature Granulocytes: 0.02 10*3/uL (ref 0.00–0.07)
Basophils Absolute: 0 10*3/uL (ref 0.0–0.1)
Basophils Relative: 0 %
Eosinophils Absolute: 0.3 10*3/uL (ref 0.0–0.5)
Eosinophils Relative: 3 %
HCT: 36.9 % (ref 36.0–46.0)
Hemoglobin: 12.3 g/dL (ref 12.0–15.0)
Immature Granulocytes: 0 %
Lymphocytes Relative: 25 %
Lymphs Abs: 2.6 10*3/uL (ref 0.7–4.0)
MCH: 28.8 pg (ref 26.0–34.0)
MCHC: 33.3 g/dL (ref 30.0–36.0)
MCV: 86.4 fL (ref 80.0–100.0)
Monocytes Absolute: 0.8 10*3/uL (ref 0.1–1.0)
Monocytes Relative: 8 %
Neutro Abs: 6.5 10*3/uL (ref 1.7–7.7)
Neutrophils Relative %: 64 %
Platelets: 334 10*3/uL (ref 150–400)
RBC: 4.27 MIL/uL (ref 3.87–5.11)
RDW: 14.9 % (ref 11.5–15.5)
WBC: 10.2 10*3/uL (ref 4.0–10.5)
nRBC: 0 % (ref 0.0–0.2)

## 2020-12-29 LAB — D-DIMER, QUANTITATIVE: D-Dimer, Quant: 0.3 ug/mL-FEU (ref 0.00–0.50)

## 2020-12-29 LAB — RESP PANEL BY RT-PCR (FLU A&B, COVID) ARPGX2
Influenza A by PCR: NEGATIVE
Influenza B by PCR: NEGATIVE
SARS Coronavirus 2 by RT PCR: NEGATIVE

## 2020-12-29 LAB — BRAIN NATRIURETIC PEPTIDE: B Natriuretic Peptide: 39.8 pg/mL (ref 0.0–100.0)

## 2020-12-29 MED ORDER — DICLOFENAC SODIUM 1 % EX GEL: 2.0000 g | Freq: Four times a day (QID) | CUTANEOUS | 0 refills | Status: DC | PRN

## 2020-12-29 MED ORDER — ALBUTEROL SULFATE HFA 108 (90 BASE) MCG/ACT IN AERS: 1.0000 | INHALATION_SPRAY | Freq: Four times a day (QID) | RESPIRATORY_TRACT | 0 refills | Status: DC | PRN

## 2020-12-29 MED ORDER — BENZONATATE 100 MG PO CAPS: 100.0000 mg | ORAL_CAPSULE | Freq: Three times a day (TID) | ORAL | 0 refills | Status: DC | PRN

## 2020-12-29 NOTE — ED Provider Notes (Signed)
Emergency Department Provider Note   I have reviewed the triage vital signs and the nursing notes.   HISTORY  Chief Complaint Chest Pain and Cough   HPI Cristina Burns is a 60 y.o. female with past history reviewed below including diabetes and hypertension presents emergency department with chest discomfort.  She is midsternal chest pressure radiating to the back and left shoulder starting last night.  She denies any exertional component to symptoms.  She is not feeling short of breath.  She is getting over a recent upper respiratory infection with cough for the past week.  She was not diagnosed with COVID or flu.  Symptoms have overall improved but the pressure in the chest developed last night which concerned her.  She does have family history of ACS but she herself has not had heart attack or other known coronary disease.  She does have baseline lymphedema but does feel like the right leg is more swollen than the left.  No fevers or chills.  She has had some coughing but no hemoptysis.    Past Medical History:  Diagnosis Date   ADHD (attention deficit hyperactivity disorder)    Allergic rhinitis    Allergy    Diabetes mellitus without complication (HCC)    Diabetic neuropathy (HCC)    GERD (gastroesophageal reflux disease)    Hypertension    Hypothyroidism    RLS (restless legs syndrome)    Stomach ulcer "it's been a Sylvain Hasten time"    Patient Active Problem List   Diagnosis Date Noted   Type 2 diabetes mellitus with diabetic polyneuropathy, with Ch[place]e Peden-term current use of insulin ([place]) 05/09/2020   Dyslipidemia 05/09/2020   Chronic cough 11/01/2019   RLS (restless legs syndrome) 10/22/2017   Gastroesophageal reflux disease 10/22/2017   Localized swelling of both lower extremities 09/10/2017   Greater trochanteric bursitis of right hip 05/21/2017   Type 2 diabetes mellitus with hyperglycemia ([place]) 08/21/2016   Attention deficit disorder 05/29/2016   Essential hypertension  03/18/2015   Hypothyroidism 03/18/2015    Past Surgical History:  Procedure Laterality Date   ABDOMINAL HYSTERECTOMY  2002   ACHILLES TENDON REPAIR  2009   CHOLECYSTECTOMY  1982   ROTATOR CUFF REPAIR Right 2011    Allergies Codeine, Darvon [propoxyphene], Doxycycline, Erythromycin, and Keflex [cephalexin]  Family History  Problem Relation Age of Onset   Heart disease Father    Heart attack Father    Heart disease Brother        CABG   Diabetes Brother    Kidney disease Brother    Cirrhosis Brother    Hypertension Brother    Heart attack Brother    Heart disease Brother    Diabetes Brother    Heart attack Brother    Diabetes Mother    Breast cancer Mother    Stroke Mother     Social History Social History   Tobacco Use   Smoking status: Former    Packs/day: 2.00    Types: Cigarettes    Start date: 06/20/1973    Quit date: 06/21/1982    Years since quitting: 38.5   Smokeless tobacco: Never  Substance Use Topics   Alcohol use: No    Alcohol/week: 0.0 standard drinks   Drug use: No    Review of Systems  Constitutional: No fever/chills Eyes: No visual changes. ENT: No sore throat. Cardiovascular: Positive chest pain. Respiratory: Denies shortness of breath. Positive cough.  Gastrointestinal: No abdominal pain.  No nausea, no vomiting.  No diarrhea.  No constipation. Genitourinary: Negative for dysuria. Musculoskeletal: Negative for back pain. Skin: Negative for rash. Neurological: Negative for headaches, focal weakness or numbness.  10-point ROS otherwise negative.  ____________________________________________   PHYSICAL EXAM:  VITAL SIGNS: ED Triage Vitals  Enc Vitals Group     BP 12/29/20 1559 (!) 144/61     Pulse Rate 12/29/20 1559 85     Resp 12/29/20 1559 20     Temp 12/29/20 1601 98.3 F (36.8 C)     Temp Source 12/29/20 1601 Oral     SpO2 12/29/20 1559 95 %   Constitutional: Alert and oriented. Well appearing and in no acute  distress. Eyes: Conjunctivae are normal. Head: Atraumatic. Nose: No congestion/rhinnorhea. Mouth/Throat: Mucous membranes are moist.  Neck: No stridor.  Cardiovascular: Normal rate, regular rhythm. Good peripheral circulation. Grossly normal heart sounds.   Respiratory: Normal respiratory effort.  No retractions. Lungs CTAB. Gastrointestinal: Soft and nontender. No distention.  Musculoskeletal: No lower extremity tenderness nor edema. No gross deformities of extremities. Neurologic:  Normal speech and language. No gross focal neurologic deficits are appreciated.  Skin:  Skin is warm, dry and intact. No rash noted.   ____________________________________________   LABS (all labs ordered are listed, but only abnormal results are displayed)  Labs Reviewed  RESP PANEL BY RT-PCR (FLU A&B, COVID) [id number]  COMPREHENSIVE METABOLIC PANEL  BRAIN NATRIURETIC PEPTIDE  CBC WITH DIFFERENTIAL/PLATELET  D-DIMER, QUANTITATIVE  TROPONIN I (HIGH SENSITIVITY)  TROPONIN I (HIGH SENSITIVITY)   ____________________________________________  EKG   EKG Interpretation  Date/Time:  Saturday December 29 2020 15:58:50 EST Ventricular Rate:  85 PR Interval:  145 QRS Duration: 145 QT Interval:  398 QTC Calculation: 474 R Axis:   87 Text Interpretation: Sinus rhythm Right bundle branch block No old tracing for comparison Confirmed by Nanda Quinton [phone]) on 12/29/2020 4:01:44 PM        ____________________________________________  RADIOLOGY  US Venous Img Lower Right (DVT Study)  Result Date: 12/29/2020 CLINICAL DATA:  Right leg swelling EXAM: RIGHT LOWER EXTREMITY VENOUS DOPPLER ULTRASOUND TECHNIQUE: Gray-scale sonography with compression, as well as color and duplex ultrasound, were performed to evaluate the deep venous system(s) from the level of the common femoral vein through the popliteal and proximal calf veins. COMPARISON:  None. FINDINGS: VENOUS Normal compressibility of the common  femoral, superficial femoral, and popliteal veins, as well as the visualized calf veins. Visualized portions of profunda femoral vein and great saphenous vein unremarkable. No filling defects to suggest DVT on grayscale or color Doppler imaging. Doppler waveforms show normal direction of venous flow, normal respiratory plasticity and response to augmentation. Limited views of the contralateral common femoral vein are unremarkable. OTHER None. Limitations: none IMPRESSION: Negative. Electronically Signed   By: Rolm Baptise M.D.   On: 12/29/2020 17:55   DG Chest Portable 1 View  Result Date: 12/29/2020 CLINICAL DATA:  Chest pain EXAM: PORTABLE CHEST 1 VIEW COMPARISON:  None. FINDINGS: Heart and mediastinal contours are within normal limits. No focal opacities or effusions. No acute bony abnormality. IMPRESSION: No active disease. Electronically Signed   By: Rolm Baptise M.D.   On: 12/29/2020 16:38    ____________________________________________   PROCEDURES  Procedure(s) performed:   Procedures  None  ____________________________________________   INITIAL IMPRESSION / ASSESSMENT AND PLAN / ED COURSE  Pertinent labs & imaging results that were available during my care of the patient were reviewed by me and considered in my medical decision making (see chart for details).  Patient presents to the emergency department with chest discomfort.  Overall lower suspicion for PE but patient is describing slight pleuritic component to her pain having some asymmetric leg swelling.  Plan for DVT ultrasound along with D-dimer.  Lab work here is overall reassuring with negative troponin and normal D-dimer.  She is not tachycardic.  No hypoxemia.  She is recovering from recent upper respiratory infection.  Chest x-ray does not show any infiltrate or post viral pneumonia.  No pneumothorax.  Plan for second troponin and reassess.  Pain may be related to resolving bronchitis type symptoms.  Given patient's age  along with 2 risk factors and family history I would consider referring her to cardiology for further evaluation and will place a referral in our system for follow up.   08:10 PM  Patient's lab work here is reassuring.  Her D-dimer is within normal limits.  BNP is normal.  COVID and flu are negative.  Chest x-ray shows no acute pulmonary edema or infiltrate.  DVT ultrasound of the right lower extremity shows no blood clot.  Her second troponin is come back normal.  Chest discomfort may be related to bronchitis and her post viral type illness.  Given her family history and personal risk factors, however, I have placed a referral in our system for her to follow with cardiology.  Discussed her work-up, results, treatment plan, follow-up plan with the patient at bedside.  She feels comfortable with the plan at discharge.  Discussed ED return precautions. ____________________________________________  FINAL CLINICAL IMPRESSION(S) / ED DIAGNOSES  Final diagnoses:  Precordial chest pain  Subacute cough     NEW OUTPATIENT MEDICATIONS STARTED DURING THIS VISIT:  New Prescriptions   ALBUTEROL (VENTOLIN HFA) 108 (90 BASE) MCG/ACT INHALER    Inhale 1-2 puffs into the lungs every 6 (six) hours as needed for wheezing or shortness of breath.   BENZONATATE (TESSALON) 100 MG CAPSULE    Take 1 capsule (100 mg total) by mouth 3 (three) times daily as needed for cough.   DICLOFENAC SODIUM (VOLTAREN) 1 % GEL    Apply 2 g topically 4 (four) times daily as needed.    Note:  This document was prepared using Dragon voice recognition software and may include unintentional dictation errors.  Nanda Quinton, MD, [facility] Emergency Medicine    Keyly Baldonado, [facility], MD 12/29/20 Thom Chimes

## 2020-12-29 NOTE — Discharge Instructions (Signed)
You were seen in the emergency room today with chest discomfort.  I suspect this pain may be related to your recent viral upper respiratory infection.  I have called in some medications to help with this.  Given your risk factors for heart disease, however, I have placed a referral for you to follow with the cardiology team.  Please reach out to them on Monday to schedule a follow-up appointment.  I have placed a referral in our system.  Return to the emergency department immediately if you develop return of severe chest pain, sweating, nausea, other sudden/severe symptoms.

## 2020-12-29 NOTE — ED Triage Notes (Signed)
Pt reports midsternal chest pain radiating to back since last night. Denies SOB, dizziness, or n/v. Pain worse with inspiration. Pt has had URI/cough for several days.

## 2020-12-31 ENCOUNTER — Other Ambulatory Visit: Payer: Self-pay | Admitting: Family Medicine

## 2020-12-31 DIAGNOSIS — R109 Unspecified abdominal pain: Secondary | ICD-10-CM

## 2021-01-21 ENCOUNTER — Other Ambulatory Visit: Payer: Self-pay | Admitting: Family Medicine

## 2021-01-21 DIAGNOSIS — I10 Essential (primary) hypertension: Secondary | ICD-10-CM

## 2021-01-30 ENCOUNTER — Other Ambulatory Visit: Payer: Self-pay | Admitting: Family Medicine

## 2021-01-30 DIAGNOSIS — G2581 Restless legs syndrome: Secondary | ICD-10-CM

## 2021-01-30 DIAGNOSIS — E1165 Type 2 diabetes mellitus with hyperglycemia: Secondary | ICD-10-CM

## 2021-02-19 ENCOUNTER — Ambulatory Visit: Payer: PRIVATE HEALTH INSURANCE | Admitting: Internal Medicine

## 2021-02-26 ENCOUNTER — Encounter: Payer: Self-pay | Admitting: Internal Medicine

## 2021-02-26 ENCOUNTER — Ambulatory Visit (INDEPENDENT_AMBULATORY_CARE_PROVIDER_SITE_OTHER): Payer: PRIVATE HEALTH INSURANCE | Admitting: Internal Medicine

## 2021-02-26 VITALS — BP 134/80 | HR 80 | Ht 62.0 in | Wt 205.4 lb

## 2021-02-26 DIAGNOSIS — E039 Hypothyroidism, unspecified: Secondary | ICD-10-CM

## 2021-02-26 DIAGNOSIS — E1165 Type 2 diabetes mellitus with hyperglycemia: Secondary | ICD-10-CM | POA: Diagnosis not present

## 2021-02-26 DIAGNOSIS — Z794 Long term (current) use of insulin: Secondary | ICD-10-CM

## 2021-02-26 LAB — POCT GLYCOSYLATED HEMOGLOBIN (HGB A1C): Hemoglobin A1C: 7.2 % — AB (ref 4.0–5.6)

## 2021-02-26 MED ORDER — FREESTYLE LIBRE 3 SENSOR MISC: 1.0000 | 3 refills | Status: DC

## 2021-02-26 MED ORDER — INSULIN PEN NEEDLE 30G X 5 MM MISC: 1.0000 | Freq: Four times a day (QID) | 3 refills | Status: DC

## 2021-02-26 NOTE — Patient Instructions (Signed)
°-   Decrease Toujeo 56  units twice a day  - Change Humalog 12 units with Breakfast, 14 units with Lunch and 14 units with Supper  - Humalog correctional insulin: ADD extra units on insulin to your meal-time Humalog  dose if your blood sugars are higher than 150. Use the scale below to help guide you:   Blood sugar before meal Number of units to inject  Less than 150 0 unit   151 -  160 1 units  161 -  170 2 units  171 -  180 3 units  181 -  190 4 units  191 -  200 5 units  201 -  210 6 units  211 -  220 7 units  221 -  230 8 units      HOW TO TREAT LOW BLOOD SUGARS (Blood sugar LESS THAN 70 MG/DL) Please follow the RULE OF 15 for the treatment of hypoglycemia treatment (when your (blood sugars are less than 70 mg/dL)   STEP 1: Take 15 grams of carbohydrates when your blood sugar is low, which includes:  3-4 GLUCOSE TABS  OR 3-4 OZ OF JUICE OR REGULAR SODA OR ONE TUBE OF GLUCOSE GEL    STEP 2: RECHECK blood sugar in 15 MINUTES STEP 3: If your blood sugar is still low at the 15 minute recheck --> then, go back to STEP 1 and treat AGAIN with another 15 grams of carbohydrates.

## 2021-02-26 NOTE — Progress Notes (Signed)
Name: Cristina Burns  Age/ Sex: 61 y.o., female   MRN/ DOB: [id number], 1960-05-20     PCP: Shelda Pal, DO   Reason for Endocrinology Evaluation: Type 2 Diabetes Mellitus  Initial Endocrine Consultative Visit: 05/08/2020    PATIENT IDENTIFIER: Cristina Burns is a 61 y.o. female with a past medical history of T2DM, RLS, Hypothyroidism and HTN. The patient has followed with Endocrinology clinic since 05/08/2020 for consultative assistance with management of her diabetes.  DIABETIC HISTORY:  Ms. Behan was diagnosed with DM in 7425, Trulicity - stomach issues. Invokana- recurrent yeast infections. Her hemoglobin A1c has ranged from  7.2%in 2021, peaking at 10.6% in 2018.   On her initial visit to our clinic her A1c 7.8%, she was on Glimepiride, MDI regimen and Ozempic. We stopped Glimepiride, continued Metfomrin,MDI regimen and decreased Ozempic due to nausea    Invokana caused  yeast infection   She stopped Ozempic it by her visit in 09/2020   SUBJECTIVE:   During the last visit (10/16/2020): A1c 9.1 % .Continue metformin, lantus , humalog     Today (02/26/2021): Cristina Burns  is here for a follow up on diabetes management. She checks her blood sugars 3 times daily . The patient has had hypoglycemic episodes since the last clinic visit.   Dexcom cost prohibitive  She is scared of [place] or jardiance due to yeats infection    HOME DIABETES REGIMEN:  metformin 500 mg XR 2 tabs twice daily Toujeo  66 units twice daily Humalog 14 units with each meal Correction factor: Humalog (BG -140/10)     Statin: yes ACE-I/ARB: yes Prior Diabetic Education: yes   METER DOWNLOAD SUMMARY: Unable to download   53- 417 mg/dL   Average BG 149     DIABETIC COMPLICATIONS: Microvascular complications:  Neuropathy Denies: CKD, retinopathy Last Eye Exam: Completed 03/2020  Macrovascular complications:   Denies: CAD, CVA, PVD   HISTORY:  Past Medical History:   Past Medical History:  Diagnosis Date   ADHD (attention deficit hyperactivity disorder)    Allergic rhinitis    Allergy    Diabetes mellitus without complication ([place])    Diabetic neuropathy ([place])    GERD (gastroesophageal reflux disease)    Hypertension    Hypothyroidism    RLS (restless legs syndrome)    Stomach ulcer "it's been a long time"   Past Surgical History:  Past Surgical History:  Procedure Laterality Date   ABDOMINAL HYSTERECTOMY  2002   ACHILLES TENDON REPAIR  2009   CHOLECYSTECTOMY  1982   ROTATOR CUFF REPAIR Right 2011   Social History:  reports that she quit smoking about 38 years ago. Her smoking use included cigarettes. She started smoking about 47 years ago. She smoked an average of 2 packs per day. She has never used smokeless tobacco. She reports that she does not drink alcohol and does not use drugs. Family History:  Family History  Problem Relation Age of Onset   Heart disease Father    Heart attack Father    Heart disease Brother        CABG   Diabetes Brother    Kidney disease Brother    Cirrhosis Brother    Hypertension Brother    Heart attack Brother    Heart disease Brother    Diabetes Brother    Heart attack Brother    Diabetes Mother    Breast cancer Mother    Stroke Mother      HOME  MEDICATIONS: Allergies as of 02/26/2021       Reactions   Codeine Hives   Darvon [propoxyphene] Hives   "Darvocet"   Doxycycline    Double Vision   Erythromycin    Stomach pain   Keflex [cephalexin] Hives        Medication List        Accurate as of February 26, 2021  3:01 PM. If you have any questions, ask your nurse or doctor.          STOP taking these medications    dicyclomine 10 MG capsule Commonly known as: BENTYL Stopped by: Dorita Sciara, MD       TAKE these medications    albuterol 108 (90 Base) MCG/ACT inhaler Commonly known as: VENTOLIN HFA Inhale 1-2 puffs into the lungs every 6 (six) hours as needed for  wheezing or shortness of breath.   amLODipine 5 MG tablet Commonly known as: NORVASC Take 1 tablet by mouth once daily   atorvastatin 40 MG tablet Commonly known as: LIPITOR Take 1 tablet by mouth once daily   azelastine 0.1 % nasal spray Commonly known as: ASTELIN Place 2 sprays into both nostrils 2 (two) times daily. Use in each nostril as directed   benzonatate 100 MG capsule Commonly known as: TESSALON Take 1 capsule (100 mg total) by mouth 3 (three) times daily as needed for cough.   cholecalciferol 25 MCG (1000 UNIT) tablet Commonly known as: VITAMIN D Take 1 tablet (1,000 Units total) by mouth daily.   diclofenac Sodium 1 % Gel Commonly known as: Voltaren Apply 2 g topically 4 (four) times daily as needed.   furosemide 40 MG tablet Commonly known as: LASIX Take 1 tablet by mouth twice daily What changed: when to take this   gabapentin 600 MG tablet Commonly known as: NEURONTIN TAKE 1 TABLET BY MOUTH THREE TIMES DAILY   glucose blood test strip Use twice daily to check blood sugar.  DX E11.9   HumaLOG KwikPen 100 UNIT/ML KwikPen Generic drug: insulin lispro Max daily 60 units   Insulin Pen Needle 30G X 5 MM Misc 1 Device by Does not apply route in the morning, at noon, in the evening, and at bedtime.   levocetirizine 5 MG tablet Commonly known as: XYZAL TAKE 1 TABLET BY MOUTH ONCE DAILY IN THE EVENING   levothyroxine 150 MCG tablet Commonly known as: SYNTHROID TAKE 1 TABLET BY MOUTH ONCE DAILY BEFORE BREAKFAST   losartan 50 MG tablet Commonly known as: COZAAR Take 1 tablet by mouth once daily   metFORMIN 500 MG 24 hr tablet Commonly known as: GLUCOPHAGE-XR Take 2 tablets (1,000 mg total) by mouth 2 (two) times daily.   methylphenidate 20 MG tablet Commonly known as: Ritalin Daily after lunch.   methylphenidate 20 MG 24 hr capsule Commonly known as: RITALIN LA Take 1 capsule (20 mg total) by mouth every morning.   methylphenidate 20 MG 24 hr  capsule Commonly known as: RITALIN LA Take 1 capsule (20 mg total) by mouth every morning. Take daily after lunch.   methylphenidate 20 MG tablet Commonly known as: RITALIN Take daily after lunch.   methylphenidate 20 MG 24 hr capsule Commonly known as: RITALIN LA Take 1 capsule (20 mg total) by mouth every morning.   methylphenidate 20 MG tablet Commonly known as: RITALIN Take 1 tab in the afternoon daily.   montelukast 10 MG tablet Commonly known as: SINGULAIR TAKE 1 TABLET BY MOUTH AT BEDTIME   multivitamin-iron-minerals-folic  acid chewable tablet Chew 1 tablet by mouth daily.   Ozempic (0.25 or 0.5 MG/DOSE) 2 MG/1.5ML Sopn Generic drug: Semaglutide(0.25 or 0.5MG /DOS) Inject 0.5 mg into the skin once a week.   pantoprazole 40 MG tablet Commonly known as: PROTONIX TAKE 1 TABLET BY MOUTH TWICE DAILY BEFORE A MEAL.   rOPINIRole 2 MG tablet Commonly known as: REQUIP TAKE 1 TABLET BY MOUTH ONCE DAILY AND 2 AT BEDTIME   Toujeo Max SoloStar 300 UNIT/ML Solostar Pen Generic drug: insulin glargine (2 Unit Dial) Inject 65 Units into the skin in the morning and at bedtime. What changed: how much to take   triamcinolone 55 MCG/ACT Aero nasal inhaler Commonly known as: Nasacort Allergy 24HR Place 2 sprays into the nose daily.   triamterene-hydrochlorothiazide 37.5-25 MG tablet Commonly known as: MAXZIDE-25 Take 1 tablet by mouth once daily         OBJECTIVE:   Vital Signs: BP 134/80 (BP Location: Left Arm, Patient Position: Sitting, Cuff Size: Small)    Pulse 80    Ht 5\' 2"  (1.575 m)    Wt 205 lb 6.4 oz (93.2 kg)    SpO2 99%    BMI 37.57 kg/m   Wt Readings from Last 3 Encounters:  02/26/21 205 lb 6.4 oz (93.2 kg)  12/03/20 216 lb (98 kg)  10/16/20 212 lb (96.2 kg)     Exam: General: Pt appears well and is in NAD  Lungs: Clear with good BS bilat with no rales, rhonchi, or wheezes  Heart: RRR with normal S1 and S2 and no gallops; no murmurs; no rub  Abdomen:  Normoactive bowel sounds, soft, nontender, without masses or organomegaly palpable  Extremities: No pretibial edema.   Neuro: MS is good with appropriate affect, pt is alert and Ox3    DM foot exam: 10/16/2020  The skin of the feet is intact without sores or ulcerations. The pedal pulses are 2+ on right and 2+ on left. The sensation is intact to a screening 5.07, 10 gram monofilament bilaterally        DATA REVIEWED:  Lab Results  Component Value Date   HGBA1C 9.1 (A) 10/16/2020   HGBA1C 7.8 (A) 05/08/2020   HGBA1C 9.5 (H) 02/01/2020   Lab Results  Component Value Date   MICROALBUR 3.6 (H) 02/01/2020   LDLCALC 45 02/01/2020   CREATININE 0.86 12/29/2020   Lab Results  Component Value Date   MICRALBCREAT 2.7 02/01/2020     Lab Results  Component Value Date   CHOL 128 08/15/2020   HDL 36.00 (L) 08/15/2020   LDLCALC 45 02/01/2020   LDLDIRECT 70.0 08/15/2020   TRIG 201.0 (H) 08/15/2020   CHOLHDL 4 08/15/2020        Latest Reference Range & Units Most Recent  Sodium 135 - 145 mmol/L 141 12/29/20 16:52  Potassium 3.5 - 5.1 mmol/L 3.6 12/29/20 16:52  Chloride 98 - 111 mmol/L 103 12/29/20 16:52  CO2 22 - 32 mmol/L 28 12/29/20 16:52  Glucose 70 - 99 mg/dL 85 12/29/20 16:52  Mean Plasma Glucose (calc) 160 06/24/19 15:47  BUN 6 - 20 mg/dL 11 12/29/20 16:52  Creatinine 0.44 - 1.00 mg/dL 0.86 12/29/20 16:52  Calcium 8.9 - 10.3 mg/dL 9.4 12/29/20 16:52  Anion gap 5 - 15  10 12/29/20 16:52  Alkaline Phosphatase 38 - 126 U/L 120 12/29/20 16:52  Albumin 3.5 - 5.0 g/dL 3.9 12/29/20 16:52  Lipase 11 - 51 U/L 26 01/08/17 19:09  AST 15 - 41 U/L 25  12/29/20 16:52  ALT 0 - 44 U/L 25 12/29/20 16:52  Total Protein 6.5 - 8.1 g/dL 7.7 12/29/20 16:52  Bilirubin, Direct 0.0 - 0.3 mg/dL 0.0 10/16/16 16:15  Total Bilirubin 0.3 - 1.2 mg/dL 0.5 12/29/20 16:52  GGT 7 - 51 U/L 46 10/16/16 16:15  GFR >60.00 mL/min 56.65 (L) 08/15/20 16:27  GFR, Estimated >60 mL/min  >60 12/29/20 16:52  GFR, Est Non African American >60 mL/min >60 01/08/17 19:09  GFR, Est African American >60 mL/min >60 01/08/17 19:09    Latest Reference Range & Units Most Recent  Total CHOL/HDL Ratio  4 08/15/20 16:27  Cholesterol 0 - 200 mg/dL 128 08/15/20 16:27  HDL Cholesterol >39.00 mg/dL 36.00 (L) 08/15/20 16:27  LDL (calc) 0 - 99 mg/dL 45 02/01/20 16:07  Direct LDL mg/dL 70.0 08/15/20 16:27  MICROALB/CREAT RATIO 0.0 - 30.0 mg/g 2.7 02/01/20 16:07  NonHDL  92.02 08/15/20 16:27  Triglycerides 0.0 - 149.0 mg/dL 201.0 (H) 08/15/20 16:27  VLDL 0.0 - 40.0 mg/dL 40.2 (H) 08/15/20 16:27    Latest Reference Range & Units 02/26/21 15:28  TSH 0.35 - 5.50 uIU/mL 0.09 (L)  (L): Data is abnormally low  ASSESSMENT / PLAN / RECOMMENDATIONS:   1) Type 2 Diabetes Mellitus, Optimally controlled, With Neuropathic  complications - Most recent A1c of 7.2 %. Goal A1c < 7.0 %.     - A1c down from 9.1 %  -She had stopped Ozempic due to GI side effects, was unable to tolerate 0.25 mg -She is intolerant to Invokana due to recurrent yeast infections, she is scared of trying other alternatives of SGLT2 inhibitors. -I have prescribed Dexcom but this has been cost prohibitive -I have praised the patient on improved glycemic control -We will make the following changes due to hypoglycemia in the fasting status -I am also going to reduce her breakfast prandial dose due to tight BG's at lunchtime    MEDICATIONS:   Continue metformin 500 mg XR 2 tabs twice daily Decrease Toujeo 56 units twice daily Change Humalog to 12 units with breakfast, 14 units with lunch, and 14 units with supper Correction factor: Humalog (BG -140/10)   EDUCATION / INSTRUCTIONS: BG monitoring instructions: Patient is instructed to check her blood sugars 3 times a day, before meals. Call [facility] if: BG persistently < 70  I reviewed the Rule of 15 for the treatment of hypoglycemia in detail with the  patient. Literature supplied.    2) Diabetic complications:  Eye: Does not  have known diabetic retinopathy.  Neuro/ Feet: Does  have known diabetic peripheral neuropathy .  Renal: Patient does not have known baseline CKD. She   is  on an ACEI/ARB at present.    3) Hypothyroidism:  -TSH suppressed -We will reduce levothyroxine dose as below   Medication Stop levothyroxine 150 Start levothyroxine 125 mcg daily   F/U in 4 months   Signed electronically by: Mack Guise, MD  [facility]  [facility] [address] [place], [place] [address] Phone: [phone] FAX: [phone]   CC: Shelda Pal, [place] [address] [place] [address] Phone: [phone]  Fax: [phone]  Return to Endocrinology clinic as below: No future appointments.

## 2021-02-27 ENCOUNTER — Encounter: Payer: Self-pay | Admitting: Internal Medicine

## 2021-02-27 LAB — TSH: TSH: 0.09 u[IU]/mL — ABNORMAL LOW (ref 0.35–5.50)

## 2021-02-27 MED ORDER — METFORMIN HCL ER 500 MG PO TB24: 1000.0000 mg | ORAL_TABLET | Freq: Two times a day (BID) | ORAL | 3 refills | Status: DC

## 2021-02-27 MED ORDER — TOUJEO MAX SOLOSTAR 300 UNIT/ML ~~LOC~~ SOPN
56.0000 [IU] | PEN_INJECTOR | Freq: Two times a day (BID) | SUBCUTANEOUS | 3 refills | Status: DC
Start: 2021-02-27 — End: 2021-06-05

## 2021-02-27 MED ORDER — LEVOTHYROXINE SODIUM 125 MCG PO TABS: 125.0000 ug | ORAL_TABLET | Freq: Every day | ORAL | 3 refills | Status: DC

## 2021-03-05 ENCOUNTER — Other Ambulatory Visit: Payer: Self-pay | Admitting: Family Medicine

## 2021-03-05 MED ORDER — AMLODIPINE BESYLATE 5 MG PO TABS: 5.0000 mg | ORAL_TABLET | Freq: Every day | ORAL | 3 refills | Status: DC

## 2021-03-06 ENCOUNTER — Other Ambulatory Visit: Payer: Self-pay | Admitting: Family Medicine

## 2021-03-06 DIAGNOSIS — G2581 Restless legs syndrome: Secondary | ICD-10-CM

## 2021-03-06 DIAGNOSIS — I10 Essential (primary) hypertension: Secondary | ICD-10-CM

## 2021-03-06 DIAGNOSIS — F988 Other specified behavioral and emotional disorders with onset usually occurring in childhood and adolescence: Secondary | ICD-10-CM

## 2021-03-06 DIAGNOSIS — E1165 Type 2 diabetes mellitus with hyperglycemia: Secondary | ICD-10-CM

## 2021-03-07 ENCOUNTER — Encounter: Payer: Self-pay | Admitting: Family Medicine

## 2021-03-07 MED ORDER — ROPINIROLE HCL 2 MG PO TABS: ORAL_TABLET | ORAL | 0 refills | Status: DC

## 2021-03-07 MED ORDER — METHYLPHENIDATE HCL ER (LA) 20 MG PO CP24: 20.0000 mg | ORAL_CAPSULE | ORAL | 0 refills | Status: DC

## 2021-03-07 MED ORDER — METHYLPHENIDATE HCL 20 MG PO TABS: ORAL_TABLET | ORAL | 0 refills | Status: DC

## 2021-03-07 MED ORDER — GABAPENTIN 600 MG PO TABS: 600.0000 mg | ORAL_TABLET | Freq: Three times a day (TID) | ORAL | 0 refills | Status: DC

## 2021-03-07 MED ORDER — LOSARTAN POTASSIUM 50 MG PO TABS: 50.0000 mg | ORAL_TABLET | Freq: Every day | ORAL | 0 refills | Status: DC

## 2021-03-08 ENCOUNTER — Other Ambulatory Visit: Payer: Self-pay | Admitting: Family Medicine

## 2021-03-08 ENCOUNTER — Encounter: Payer: Self-pay | Admitting: Internal Medicine

## 2021-03-08 DIAGNOSIS — R109 Unspecified abdominal pain: Secondary | ICD-10-CM

## 2021-03-08 MED ORDER — DICYCLOMINE HCL 10 MG PO CAPS: ORAL_CAPSULE | ORAL | 0 refills | Status: DC

## 2021-03-18 ENCOUNTER — Other Ambulatory Visit: Payer: Self-pay | Admitting: Family Medicine

## 2021-04-04 ENCOUNTER — Other Ambulatory Visit: Payer: Self-pay | Admitting: Family Medicine

## 2021-04-19 ENCOUNTER — Other Ambulatory Visit: Payer: Self-pay | Admitting: Family Medicine

## 2021-04-19 DIAGNOSIS — L299 Pruritus, unspecified: Secondary | ICD-10-CM

## 2021-04-27 ENCOUNTER — Other Ambulatory Visit: Payer: Self-pay | Admitting: Family Medicine

## 2021-04-27 DIAGNOSIS — E1165 Type 2 diabetes mellitus with hyperglycemia: Secondary | ICD-10-CM

## 2021-04-27 DIAGNOSIS — G2581 Restless legs syndrome: Secondary | ICD-10-CM

## 2021-04-27 DIAGNOSIS — I10 Essential (primary) hypertension: Secondary | ICD-10-CM

## 2021-04-27 DIAGNOSIS — L299 Pruritus, unspecified: Secondary | ICD-10-CM

## 2021-05-16 ENCOUNTER — Other Ambulatory Visit: Payer: Self-pay | Admitting: Family Medicine

## 2021-05-16 DIAGNOSIS — R109 Unspecified abdominal pain: Secondary | ICD-10-CM

## 2021-05-23 ENCOUNTER — Encounter: Payer: Self-pay | Admitting: Internal Medicine

## 2021-05-23 DIAGNOSIS — E1165 Type 2 diabetes mellitus with hyperglycemia: Secondary | ICD-10-CM

## 2021-05-24 MED ORDER — INSULIN LISPRO (1 UNIT DIAL) 100 UNIT/ML (KWIKPEN): PEN_INJECTOR | SUBCUTANEOUS | 3 refills | Status: DC

## 2021-06-03 ENCOUNTER — Other Ambulatory Visit: Payer: Self-pay | Admitting: Family Medicine

## 2021-06-03 DIAGNOSIS — E1165 Type 2 diabetes mellitus with hyperglycemia: Secondary | ICD-10-CM

## 2021-06-03 DIAGNOSIS — I10 Essential (primary) hypertension: Secondary | ICD-10-CM

## 2021-06-03 DIAGNOSIS — Z794 Long term (current) use of insulin: Secondary | ICD-10-CM

## 2021-06-03 DIAGNOSIS — R053 Chronic cough: Secondary | ICD-10-CM

## 2021-06-03 DIAGNOSIS — G2581 Restless legs syndrome: Secondary | ICD-10-CM

## 2021-06-05 ENCOUNTER — Ambulatory Visit (INDEPENDENT_AMBULATORY_CARE_PROVIDER_SITE_OTHER): Payer: PRIVATE HEALTH INSURANCE | Admitting: Internal Medicine

## 2021-06-05 ENCOUNTER — Encounter: Payer: Self-pay | Admitting: Internal Medicine

## 2021-06-05 VITALS — BP 126/72 | HR 87 | Ht 62.0 in | Wt 207.0 lb

## 2021-06-05 DIAGNOSIS — E039 Hypothyroidism, unspecified: Secondary | ICD-10-CM | POA: Diagnosis not present

## 2021-06-05 DIAGNOSIS — E1165 Type 2 diabetes mellitus with hyperglycemia: Secondary | ICD-10-CM

## 2021-06-05 DIAGNOSIS — Z794 Long term (current) use of insulin: Secondary | ICD-10-CM | POA: Diagnosis not present

## 2021-06-05 DIAGNOSIS — R739 Hyperglycemia, unspecified: Secondary | ICD-10-CM

## 2021-06-05 LAB — POCT GLYCOSYLATED HEMOGLOBIN (HGB A1C): Hemoglobin A1C: 8.2 % — AB (ref 4.0–5.6)

## 2021-06-05 MED ORDER — INSULIN LISPRO (1 UNIT DIAL) 100 UNIT/ML (KWIKPEN): PEN_INJECTOR | SUBCUTANEOUS | 6 refills | Status: DC

## 2021-06-05 MED ORDER — TOUJEO MAX SOLOSTAR 300 UNIT/ML ~~LOC~~ SOPN
60.0000 [IU] | PEN_INJECTOR | Freq: Two times a day (BID) | SUBCUTANEOUS | 6 refills | Status: DC
Start: 2021-06-05 — End: 2021-09-18

## 2021-06-05 NOTE — Patient Instructions (Addendum)
-   Continue Metformin 500 mg 2 tabs Twice a day  ?- Change Toujeo 60  units twice a day  ?- Change Humalog 20 units with each meal  ?- Humalog correctional insulin: ADD extra units on insulin to your meal-time Humalog  dose if your blood sugars are higher than 150. Use the scale below to help guide you:  ? ?Blood sugar before meal Number of units to inject  ?Less than 150 0 unit  ? 151 -  160 1 units  ?161 -  170 2 units  ?171 -  180 3 units  ?181 -  190 4 units  ?191 -  200 5 units  ?201 -  210 6 units  ?211 -  220 7 units  ?221 -  230 8 units  ?231 - 240   ? ? ? ? ?HOW TO TREAT LOW BLOOD SUGARS (Blood sugar LESS THAN 70 MG/DL) ?Please follow the RULE OF 15 for the treatment of hypoglycemia treatment (when your (blood sugars are less than 70 mg/dL)  ? ?STEP 1: Take 15 grams of carbohydrates when your blood sugar is low, which includes:  ?3-4 GLUCOSE TABS  OR ?3-4 OZ OF JUICE OR REGULAR SODA OR ?ONE TUBE OF GLUCOSE GEL   ? ?STEP 2: RECHECK blood sugar in 15 MINUTES ?STEP 3: If your blood sugar is still low at the 15 minute recheck --> then, go back to STEP 1 and treat AGAIN with another 15 grams of carbohydrates. ? ? ?

## 2021-06-05 NOTE — Progress Notes (Signed)
Name: Cristina Burns  Age/ Sex: 61 y.o., female   MRN/ DOB: [id number], 1960/07/14     PCP: Shelda Pal, DO   Reason for Endocrinology Evaluation: Type 2 Diabetes Mellitus  Initial Endocrine Consultative Visit: 05/08/2020    PATIENT IDENTIFIER: Cristina Burns is a 61 y.o. female with a past medical history of T2DM, RLS, Hypothyroidism and HTN. The patient has followed with Endocrinology clinic since 05/08/2020 for consultative assistance with management of her diabetes.  DIABETIC HISTORY:  Ms. Koob was diagnosed with DM in 2353, Trulicity - stomach issues. Invokana- recurrent yeast infections. Her hemoglobin A1c has ranged from  7.2%in 2021, peaking at 10.6% in 2018.   On her initial visit to our clinic her A1c 7.8%, she was on Glimepiride, MDI regimen and Ozempic. We stopped Glimepiride, continued Metfomrin,MDI regimen and decreased Ozempic due to nausea    Invokana caused  yeast infection   She stopped Ozempic it by her visit in 09/2020   SUBJECTIVE:   During the last visit (02/26/2021): A1c 7.2 % .Continue metformin, lantus , humalog     Today (06/05/2021): Cristina Burns  is here for a follow up on diabetes management. She checks her blood sugars 3 times daily . The patient has had hypoglycemic episodes since the last clinic visit.  She is scared of [place] or jardiance due to yeats infection  She has self ordered  She is tired and falls asleep during work  No prior dx of OSA     HOME DIABETES REGIMEN:  Metformin 500 mg XR 2 tabs twice daily Toujeo  56 units twice daily Humalog 12 units with breakfast, 14 units with lunch, 14 units with supper  Correction factor: Humalog (BG -140/10)     Statin: yes ACE-I/ARB: yes Prior Diabetic Education: yes    CONTINUOUS GLUCOSE MONITORING RECORD INTERPRETATION    Dates of Recording: May/4-May/17/2023  Sensor description: Freestyle libre  Results statistics:   CGM use % of time 98  Average and SD  236/33.4  Time in range     29   %  % Time Above 180 32  % Time above 250 39  % Time Below target 0      Glycemic patterns summary: Patient has been noted with hyperglycemia during the day and night  Hyperglycemic episodes all day and night but worse after supper  Hypoglycemic episodes occurred N/A  Overnight periods: High       DIABETIC COMPLICATIONS: Microvascular complications:  Neuropathy Denies: CKD, retinopathy Last Eye Exam: Completed 03/2020  Macrovascular complications:   Denies: CAD, CVA, PVD   HISTORY:  Past Medical History:  Past Medical History:  Diagnosis Date   ADHD (attention deficit hyperactivity disorder)    Allergic rhinitis    Allergy    Diabetes mellitus without complication (HCC)    Diabetic neuropathy (HCC)    GERD (gastroesophageal reflux disease)    Hypertension    Hypothyroidism    RLS (restless legs syndrome)    Stomach ulcer "it's been a long time"   Past Surgical History:  Past Surgical History:  Procedure Laterality Date   ABDOMINAL HYSTERECTOMY  2002   ACHILLES TENDON REPAIR  2009   CHOLECYSTECTOMY  1982   ROTATOR CUFF REPAIR Right 2011   Social History:  reports that she quit smoking about 38 years ago. Her smoking use included cigarettes. She started smoking about 47 years ago. She smoked an average of 2 packs per day. She has never used smokeless tobacco. She  reports that she does not drink alcohol and does not use drugs. Family History:  Family History  Problem Relation Age of Onset   Heart disease Father    Heart attack Father    Heart disease Brother        CABG   Diabetes Brother    Kidney disease Brother    Cirrhosis Brother    Hypertension Brother    Heart attack Brother    Heart disease Brother    Diabetes Brother    Heart attack Brother    Diabetes Mother    Breast cancer Mother    Stroke Mother      HOME MEDICATIONS: Allergies as of 06/05/2021       Reactions   Codeine Hives   Darvon  [propoxyphene] Hives   "Darvocet"   Doxycycline    Double Vision   Erythromycin    Stomach pain   Keflex [cephalexin] Hives        Medication List        Accurate as of Jun 05, 2021  3:29 PM. If you have any questions, ask your nurse or doctor.          albuterol 108 (90 Base) MCG/ACT inhaler Commonly known as: VENTOLIN HFA Inhale 1-2 puffs into the lungs every 6 (six) hours as needed for wheezing or shortness of breath.   amLODipine 5 MG tablet Commonly known as: NORVASC Take 1 tablet (5 mg total) by mouth daily.   atorvastatin 40 MG tablet Commonly known as: LIPITOR Take 1 tablet by mouth once daily   Azelastine HCl 137 MCG/SPRAY Soln USE 2 SPRAY(S) IN EACH NOSTRIL TWICE DAILY AS DIRECTED   cholecalciferol 25 MCG (1000 UNIT) tablet Commonly known as: VITAMIN D Take 1 tablet (1,000 Units total) by mouth daily.   diclofenac Sodium 1 % Gel Commonly known as: Voltaren Apply 2 g topically 4 (four) times daily as needed.   dicyclomine 10 MG capsule Commonly known as: BENTYL TAKE 1 CAPSULE BY MOUTH EVERY 6 HOURS AS NEEDED FOR  ABDOMINAL  CRAMPING   FreeStyle Libre 3 Sensor Misc 1 Device by Does not apply route every 14 (fourteen) days.   furosemide 40 MG tablet Commonly known as: LASIX Take 1 tablet by mouth twice daily What changed: when to take this   gabapentin 600 MG tablet Commonly known as: NEURONTIN TAKE 1 TABLET BY MOUTH THREE TIMES DAILY   glucose blood test strip Use twice daily to check blood sugar.  DX E11.9   insulin lispro 100 UNIT/ML KwikPen Commonly known as: HumaLOG KwikPen Max daily 60 units   Insulin Pen Needle 30G X 5 MM Misc 1 Device by Does not apply route in the morning, at noon, in the evening, and at bedtime.   levocetirizine 5 MG tablet Commonly known as: XYZAL TAKE 1 TABLET BY MOUTH ONCE DAILY IN THE EVENING   levothyroxine 125 MCG tablet Commonly known as: SYNTHROID Take 1 tablet (125 mcg total) by mouth daily.    losartan 50 MG tablet Commonly known as: COZAAR Take 1 tablet by mouth once daily   metFORMIN 500 MG 24 hr tablet Commonly known as: GLUCOPHAGE-XR Take 2 tablets (1,000 mg total) by mouth 2 (two) times daily.   methylphenidate 20 MG tablet Commonly known as: RITALIN Take daily after lunch.   methylphenidate 20 MG 24 hr capsule Commonly known as: RITALIN LA Take 1 capsule (20 mg total) by mouth every morning. Take daily after lunch.   methylphenidate 20 MG 24  hr capsule Commonly known as: RITALIN LA Take 1 capsule (20 mg total) by mouth every morning.   methylphenidate 20 MG tablet Commonly known as: RITALIN Take 1 tab in the afternoon daily.   methylphenidate 20 MG tablet Commonly known as: Ritalin Daily after lunch.   methylphenidate 20 MG 24 hr capsule Commonly known as: RITALIN LA Take 1 capsule (20 mg total) by mouth every morning.   montelukast 10 MG tablet Commonly known as: SINGULAIR TAKE 1 TABLET BY MOUTH AT BEDTIME   multivitamin-iron-minerals-folic acid chewable tablet Chew 1 tablet by mouth daily.   pantoprazole 40 MG tablet Commonly known as: PROTONIX TAKE 1 TABLET BY MOUTH TWICE DAILY BEFORE A MEAL.   rOPINIRole 2 MG tablet Commonly known as: REQUIP TAKE 1 TABLET BY MOUTH ONCE DAILY AND 2 AT BEDTIME   Toujeo Max SoloStar 300 UNIT/ML Solostar Pen Generic drug: insulin glargine (2 Unit Dial) Inject 56 Units into the skin in the morning and at bedtime.   triamcinolone 55 MCG/ACT Aero nasal inhaler Commonly known as: Nasacort Allergy 24HR Place 2 sprays into the nose daily.   triamterene-hydrochlorothiazide 37.5-25 MG tablet Commonly known as: MAXZIDE-25 Take 1 tablet by mouth once daily         OBJECTIVE:   Vital Signs: BP 126/72 (BP Location: Left Arm, Patient Position: Sitting, Cuff Size: Large)   Pulse 87   Ht '5\' 2"'$  (1.575 m)   Wt 207 lb (93.9 kg)   SpO2 98%   BMI 37.86 kg/m   Wt Readings from Last 3 Encounters:  06/05/21 207 lb  (93.9 kg)  02/26/21 205 lb 6.4 oz (93.2 kg)  12/03/20 216 lb (98 kg)     Exam: General: Pt appears well and is in NAD  Lungs: Clear with good BS bilat with no rales, rhonchi, or wheezes  Heart: RRR with normal S1 and S2 and no gallops; no murmurs; no rub  Abdomen: Normoactive bowel sounds, soft, nontender, without masses or organomegaly palpable  Extremities: No pretibial edema.   Neuro: MS is good with appropriate affect, pt is alert and Ox3    DM foot exam: 10/16/2020  The skin of the feet is intact without sores or ulcerations. The pedal pulses are 2+ on right and 2+ on left. The sensation is intact to a screening 5.07, 10 gram monofilament bilaterally        DATA REVIEWED:  Lab Results  Component Value Date   HGBA1C 7.2 (A) 02/26/2021   HGBA1C 9.1 (A) 10/16/2020   HGBA1C 7.8 (A) 05/08/2020      Latest Reference Range & Units Most Recent  Sodium 135 - 145 mmol/L 141 12/29/20 16:52  Potassium 3.5 - 5.1 mmol/L 3.6 12/29/20 16:52  Chloride 98 - 111 mmol/L 103 12/29/20 16:52  CO2 22 - 32 mmol/L 28 12/29/20 16:52  Glucose 70 - 99 mg/dL 85 12/29/20 16:52  Mean Plasma Glucose (calc) 160 06/24/19 15:47  BUN 6 - 20 mg/dL 11 12/29/20 16:52  Creatinine 0.44 - 1.00 mg/dL 0.86 12/29/20 16:52  Calcium 8.9 - 10.3 mg/dL 9.4 12/29/20 16:52  Anion gap 5 - 15  10 12/29/20 16:52  Alkaline Phosphatase 38 - 126 U/L 120 12/29/20 16:52  Albumin 3.5 - 5.0 g/dL 3.9 12/29/20 16:52  Lipase 11 - 51 U/L 26 01/08/17 19:09  AST 15 - 41 U/L 25 12/29/20 16:52  ALT 0 - 44 U/L 25 12/29/20 16:52  Total Protein 6.5 - 8.1 g/dL 7.7 12/29/20 16:52  Bilirubin, Direct 0.0 - 0.3 mg/dL  0.0 10/16/16 16:15  Total Bilirubin 0.3 - 1.2 mg/dL 0.5 12/29/20 16:52  GGT 7 - 51 U/L 46 10/16/16 16:15  GFR >60.00 mL/min 56.65 (L) 08/15/20 16:27  GFR, Estimated >60 mL/min >60 12/29/20 16:52  GFR, Est Non African American >60 mL/min >60 01/08/17 19:09  GFR, Est African American >60 mL/min  >60 01/08/17 19:09    Latest Reference Range & Units Most Recent  Total CHOL/HDL Ratio  4 08/15/20 16:27  Cholesterol 0 - 200 mg/dL 128 08/15/20 16:27  HDL Cholesterol >39.00 mg/dL 36.00 (L) 08/15/20 16:27  LDL (calc) 0 - 99 mg/dL 45 02/01/20 16:07  Direct LDL mg/dL 70.0 08/15/20 16:27  MICROALB/CREAT RATIO 0.0 - 30.0 mg/g 2.7 02/01/20 16:07  NonHDL  92.02 08/15/20 16:27  Triglycerides 0.0 - 149.0 mg/dL 201.0 (H) 08/15/20 16:27  VLDL 0.0 - 40.0 mg/dL 40.2 (H) 08/15/20 16:27    Latest Reference Range & Units 06/05/21 16:03  TSH 0.35 - 5.50 uIU/mL 1.96  T4,Free(Direct) 0.60 - 1.60 ng/dL 1.17    ASSESSMENT / PLAN / RECOMMENDATIONS:   1) Type 2 Diabetes Mellitus, Optimally controlled, With Neuropathic  complications - Most recent A1c of 8.2 %. Goal A1c < 7.0 %.     -She had stopped Ozempic due to GI side effects, was unable to tolerate 0.25 mg -She is intolerant to Invokana due to recurrent yeast infections, she is scared of trying other alternatives of SGLT2 inhibitors. -She has self ordered OmniPod, she aborted today thinking she will get trained on it today.  I did explain to the patient that she will need a referral to our CDE for proper OmniPod training -Will increase basal insulin as below as well as her prandial dose of insulin  MEDICATIONS:   Continue metformin 500 mg XR 2 tabs twice daily Increase Toujeo 60 units twice daily Increase Humalog 20 units 3 times daily before every meal Correction factor: Humalog (BG -140/10)   EDUCATION / INSTRUCTIONS: BG monitoring instructions: Patient is instructed to check her blood sugars 3 times a day, before meals. Call [facility] if: BG persistently < 70  I reviewed the Rule of 15 for the treatment of hypoglycemia in detail with the patient. Literature supplied.    2) Diabetic complications:  Eye: Does not  have known diabetic retinopathy.  Neuro/ Feet: Does  have known diabetic peripheral neuropathy .   Renal: Patient does not have known baseline CKD. She   is  on an ACEI/ARB at present.    3) Hypothyroidism:  -TSH is normal on today's labs -No change today   Medication  Continue levothyroxine 125 mcg daily   F/U in 6 months   Signed electronically by: Mack Guise, MD  [facility]  [facility] [address] [place], [place] [address] Phone: [phone] FAX: [phone]   CC: Shelda Pal, [address] [place] [address] Phone: [phone]  Fax: [phone]  Return to Endocrinology clinic as below: No future appointments.

## 2021-06-06 LAB — TSH: TSH: 1.96 u[IU]/mL (ref 0.35–5.50)

## 2021-06-06 LAB — T4, FREE: Free T4: 1.17 ng/dL (ref 0.60–1.60)

## 2021-06-10 ENCOUNTER — Telehealth: Payer: Self-pay | Admitting: Dietician

## 2021-06-10 ENCOUNTER — Encounter: Payer: Self-pay | Admitting: Internal Medicine

## 2021-06-10 NOTE — Telephone Encounter (Signed)
Called patient.  Patient has an Omnipod Insulin pump (unsure what series) and would like training on this.  She self ordered the pump.  Left message to call our office with updated information so we can arrange training.  Antonieta Iba, RD, LDN, CDCES

## 2021-06-20 ENCOUNTER — Other Ambulatory Visit: Payer: Self-pay | Admitting: Family Medicine

## 2021-06-20 DIAGNOSIS — Z794 Long term (current) use of insulin: Secondary | ICD-10-CM

## 2021-07-01 ENCOUNTER — Other Ambulatory Visit: Payer: Self-pay | Admitting: Family Medicine

## 2021-07-01 DIAGNOSIS — G2581 Restless legs syndrome: Secondary | ICD-10-CM

## 2021-07-01 DIAGNOSIS — I10 Essential (primary) hypertension: Secondary | ICD-10-CM

## 2021-07-01 DIAGNOSIS — Z794 Long term (current) use of insulin: Secondary | ICD-10-CM

## 2021-07-09 NOTE — Telephone Encounter (Signed)
Message left to call me to schedule pump training. 

## 2021-07-25 ENCOUNTER — Other Ambulatory Visit: Payer: Self-pay | Admitting: Family Medicine

## 2021-07-25 DIAGNOSIS — R109 Unspecified abdominal pain: Secondary | ICD-10-CM

## 2021-07-29 ENCOUNTER — Other Ambulatory Visit: Payer: Self-pay | Admitting: Family Medicine

## 2021-07-29 DIAGNOSIS — R053 Chronic cough: Secondary | ICD-10-CM

## 2021-07-31 ENCOUNTER — Encounter: Payer: PRIVATE HEALTH INSURANCE | Attending: Internal Medicine | Admitting: Nutrition

## 2021-07-31 ENCOUNTER — Telehealth: Payer: Self-pay | Admitting: Nutrition

## 2021-07-31 NOTE — Patient Instructions (Signed)
Call when you get your new pump and dexcom to set up a pump training appointment

## 2021-07-31 NOTE — Progress Notes (Signed)
WE discussed how this pump delivers insulin and how to use the PDM.  She wanted information on the OmniPod 5 pump, and decided she would prefer this pump.  She is currently wearing a LIbre 3 sensor.  She was told that she will need to use the Dexcom sensor, and she reports that she has worn this before, and can use this if we order it for her.  Dr. Kelton Pillar notified of patient's request, and she approved of this.  Note sent to [place] to order above pump and dexcom and patient was told to call me when she gets these in, and we can arrange another training.

## 2021-07-31 NOTE — Telephone Encounter (Signed)
Patient is now requesting an OmniPod 5 pump/starter kit.  Please order this from her regular pharmacy.  She will also need the 5 pods, and will be using 1 pod every 2 days.   She is also going to need  Dexcom G6 sensors and transmitter.  These can also go to her pharmacy.   Thank you

## 2021-08-01 ENCOUNTER — Other Ambulatory Visit: Payer: Self-pay | Admitting: Family Medicine

## 2021-08-01 DIAGNOSIS — L299 Pruritus, unspecified: Secondary | ICD-10-CM

## 2021-08-01 DIAGNOSIS — I10 Essential (primary) hypertension: Secondary | ICD-10-CM

## 2021-08-01 DIAGNOSIS — G2581 Restless legs syndrome: Secondary | ICD-10-CM

## 2021-08-01 MED ORDER — OMNIPOD 5 DEXG7G6 PODS GEN 5 MISC: 2 refills | Status: DC

## 2021-08-01 MED ORDER — DEXCOM G6 TRANSMITTER MISC: 3 refills | Status: DC

## 2021-08-01 MED ORDER — OMNIPOD 5 DEXG7G6 INTRO GEN 5 KIT: PACK | 0 refills | Status: DC

## 2021-08-01 MED ORDER — DEXCOM G6 SENSOR MISC: 3 refills | Status: DC

## 2021-08-01 NOTE — Telephone Encounter (Signed)
Done

## 2021-08-16 ENCOUNTER — Other Ambulatory Visit: Payer: Self-pay | Admitting: Family Medicine

## 2021-08-16 DIAGNOSIS — M7989 Other specified soft tissue disorders: Secondary | ICD-10-CM

## 2021-08-22 ENCOUNTER — Other Ambulatory Visit: Payer: Self-pay

## 2021-08-22 MED ORDER — DEXCOM G6 TRANSMITTER MISC: 3 refills | Status: DC

## 2021-08-22 MED ORDER — DEXCOM G6 RECEIVER DEVI: 0 refills | Status: DC

## 2021-08-22 MED ORDER — OMNIPOD 5 DEXG7G6 PODS GEN 5 MISC: 2 refills | Status: DC

## 2021-08-22 MED ORDER — DEXCOM G6 SENSOR MISC
3 refills | Status: DC
Start: 2021-08-22 — End: 2022-02-24

## 2021-08-22 MED ORDER — OMNIPOD 5 DEXG7G6 INTRO GEN 5 KIT: PACK | 0 refills | Status: DC

## 2021-08-23 ENCOUNTER — Other Ambulatory Visit: Payer: Self-pay | Admitting: Family Medicine

## 2021-08-23 DIAGNOSIS — K219 Gastro-esophageal reflux disease without esophagitis: Secondary | ICD-10-CM

## 2021-08-23 DIAGNOSIS — I10 Essential (primary) hypertension: Secondary | ICD-10-CM

## 2021-08-26 ENCOUNTER — Encounter: Payer: Self-pay | Admitting: Internal Medicine

## 2021-09-05 ENCOUNTER — Other Ambulatory Visit: Payer: Self-pay | Admitting: Family Medicine

## 2021-09-05 DIAGNOSIS — E1165 Type 2 diabetes mellitus with hyperglycemia: Secondary | ICD-10-CM

## 2021-09-05 DIAGNOSIS — G2581 Restless legs syndrome: Secondary | ICD-10-CM

## 2021-09-05 NOTE — Telephone Encounter (Signed)
Requesting:requip 2 mg Contract:unknown JIJ:[id number] Last Visit:02/26/21 Next Visit:unknown Last Refill:08/01/21  Please Advise

## 2021-09-05 NOTE — Telephone Encounter (Signed)
Pt due for appt, plz sched. Ty.

## 2021-09-18 ENCOUNTER — Encounter: Payer: Self-pay | Admitting: Internal Medicine

## 2021-09-18 ENCOUNTER — Other Ambulatory Visit: Payer: Self-pay | Admitting: Family Medicine

## 2021-09-18 MED ORDER — INSULIN LISPRO 100 UNIT/ML IJ SOLN: INTRAMUSCULAR | 4 refills | Status: DC

## 2021-09-23 ENCOUNTER — Other Ambulatory Visit: Payer: Self-pay | Admitting: Family Medicine

## 2021-09-23 DIAGNOSIS — K219 Gastro-esophageal reflux disease without esophagitis: Secondary | ICD-10-CM

## 2021-09-23 DIAGNOSIS — R109 Unspecified abdominal pain: Secondary | ICD-10-CM

## 2021-09-26 ENCOUNTER — Other Ambulatory Visit (HOSPITAL_COMMUNITY): Payer: Self-pay

## 2021-09-30 ENCOUNTER — Other Ambulatory Visit: Payer: Self-pay | Admitting: Family Medicine

## 2021-09-30 DIAGNOSIS — E1165 Type 2 diabetes mellitus with hyperglycemia: Secondary | ICD-10-CM

## 2021-09-30 DIAGNOSIS — I10 Essential (primary) hypertension: Secondary | ICD-10-CM

## 2021-09-30 DIAGNOSIS — G2581 Restless legs syndrome: Secondary | ICD-10-CM

## 2021-10-03 ENCOUNTER — Encounter: Payer: Self-pay | Admitting: Internal Medicine

## 2021-10-03 ENCOUNTER — Other Ambulatory Visit: Payer: Self-pay | Admitting: Internal Medicine

## 2021-10-03 MED ORDER — HUMALOG KWIKPEN 200 UNIT/ML ~~LOC~~ SOPN
PEN_INJECTOR | SUBCUTANEOUS | 3 refills | Status: DC
Start: 2021-10-03 — End: 2021-10-09

## 2021-10-07 ENCOUNTER — Telehealth: Payer: Self-pay

## 2021-10-07 NOTE — Telephone Encounter (Signed)
Attempted to contact patient by phone no answer and vm is full. Sent [employer].

## 2021-10-07 NOTE — Telephone Encounter (Signed)
Humalog Kwikpen U200  doesn't come in a generic. Insulin will only cover the Insulin lispro. Please advise

## 2021-10-08 NOTE — Telephone Encounter (Signed)
Patient has appointment tomorrow and can discuss

## 2021-10-09 ENCOUNTER — Encounter: Payer: PRIVATE HEALTH INSURANCE | Attending: Internal Medicine | Admitting: Nutrition

## 2021-10-09 ENCOUNTER — Other Ambulatory Visit: Payer: Self-pay

## 2021-10-09 ENCOUNTER — Ambulatory Visit: Payer: PRIVATE HEALTH INSURANCE | Admitting: Internal Medicine

## 2021-10-09 DIAGNOSIS — E1165 Type 2 diabetes mellitus with hyperglycemia: Secondary | ICD-10-CM | POA: Insufficient documentation

## 2021-10-09 DIAGNOSIS — Z794 Long term (current) use of insulin: Secondary | ICD-10-CM | POA: Insufficient documentation

## 2021-10-09 MED ORDER — HUMALOG KWIKPEN 200 UNIT/ML ~~LOC~~ SOPN
80.0000 [IU] | PEN_INJECTOR | Freq: Every day | SUBCUTANEOUS | 11 refills | Status: DC
Start: 2021-10-09 — End: 2022-04-02

## 2021-10-09 NOTE — Progress Notes (Deleted)
Name: Cristina Burns  Age/ Sex: 61 y.o., female   MRN/ DOB: [id number], 12/01/1960     PCP: Shelda Pal, DO   Reason for Endocrinology Evaluation: Type 2 Diabetes Mellitus  Initial Endocrine Consultative Visit: 05/08/2020    PATIENT IDENTIFIER: [name] is a 61 y.o. female with a past medical history of T2DM, RLS, Hypothyroidism and HTN. The patient has followed with Endocrinology clinic since 05/08/2020 for consultative assistance with management of her diabetes.  DIABETIC HISTORY:  Ms. Dexheimer was diagnosed with DM in 0923, Trulicity - stomach issues. Invokana- recurrent yeast infections. Her hemoglobin A1c has ranged from  7.2%in 2021, peaking at 10.6% in 2018.   On her initial visit to our clinic her A1c 7.8%, she was on Glimepiride, MDI regimen and Ozempic. We stopped Glimepiride, continued Metfomrin,MDI regimen and decreased Ozempic due to nausea    Invokana caused  yeast infection   She stopped Ozempic it by her visit in 09/2020   SUBJECTIVE:   During the last visit (06/05/2021): A1c 8.2 % .Continue metformin, lantus , humalog     Today (10/09/2021): [name]  is here for a follow up on diabetes management. She checks her blood sugars 3 times daily . The patient has had hypoglycemic episodes since the last clinic visit.  She is scared of [place] or jardiance due to yeats infection  She has self ordered  She is tired and falls asleep during work  No prior dx of OSA     HOME DIABETES REGIMEN:  Metformin 500 mg XR 2 tabs twice daily Toujeo 60 units twice daily Humalog 20 units TIDQAC Correction factor: Humalog (BG -140/10)     Statin: yes ACE-I/ARB: yes Prior Diabetic Education: yes    CONTINUOUS GLUCOSE MONITORING RECORD INTERPRETATION    Dates of Recording: May/4-May/17/2023  Sensor description: Freestyle libre  Results statistics:   CGM use % of time 98  Average and SD 236/33.4  Time in range     29   %  % Time Above 180 32   % Time above 250 39  % Time Below target 0      Glycemic patterns summary: Patient has been noted with hyperglycemia during the day and night  Hyperglycemic episodes all day and night but worse after supper  Hypoglycemic episodes occurred N/A  Overnight periods: High       DIABETIC COMPLICATIONS: Microvascular complications:  Neuropathy Denies: CKD, retinopathy Last Eye Exam: Completed 03/2020  Macrovascular complications:   Denies: CAD, CVA, PVD   HISTORY:  Past Medical History:  Past Medical History:  Diagnosis Date   ADHD (attention deficit hyperactivity disorder)    Allergic rhinitis    Allergy    Diabetes mellitus without complication (HCC)    Diabetic neuropathy (HCC)    GERD (gastroesophageal reflux disease)    Hypertension    Hypothyroidism    RLS (restless legs syndrome)    Stomach ulcer "it's been a long time"   Past Surgical History:  Past Surgical History:  Procedure Laterality Date   ABDOMINAL HYSTERECTOMY  2002   ACHILLES TENDON REPAIR  2009   CHOLECYSTECTOMY  1982   ROTATOR CUFF REPAIR Right 2011   Social History:  reports that she quit smoking about 39 years ago. Her smoking use included cigarettes. She started smoking about 48 years ago. She smoked an average of 2 packs per day. She has never used smokeless tobacco. She reports that she does not drink alcohol and does not use  drugs. Family History:  Family History  Problem Relation Age of Onset   Heart disease Father    Heart attack Father    Heart disease Brother        CABG   Diabetes Brother    Kidney disease Brother    Cirrhosis Brother    Hypertension Brother    Heart attack Brother    Heart disease Brother    Diabetes Brother    Heart attack Brother    Diabetes Mother    Breast cancer Mother    Stroke Mother      HOME MEDICATIONS: Allergies as of 10/09/2021       Reactions   Codeine Hives   Darvon [propoxyphene] Hives   "Darvocet"   Doxycycline    Double Vision    Erythromycin    Stomach pain   Keflex [cephalexin] Hives        Medication List        Accurate as of October 09, 2021  9:31 AM. If you have any questions, ask your nurse or doctor.          albuterol 108 (90 Base) MCG/ACT inhaler Commonly known as: VENTOLIN HFA Inhale 1-2 puffs into the lungs every 6 (six) hours as needed for wheezing or shortness of breath.   amLODipine 5 MG tablet Commonly known as: NORVASC Take 1 tablet (5 mg total) by mouth daily.   atorvastatin 40 MG tablet Commonly known as: LIPITOR Take 1 tablet by mouth once daily   Azelastine HCl 137 MCG/SPRAY Soln USE 2 SPRAY(S) IN EACH NOSTRIL TWICE DAILY AS DIRECTED   cholecalciferol 25 MCG (1000 UNIT) tablet Commonly known as: VITAMIN D3 Take 1 tablet (1,000 Units total) by mouth daily.   Dexcom G6 Receiver Devi Use to check blood sugar   Dexcom G6 Sensor Misc Change every 10 days   Dexcom G6 Transmitter Misc Change every 90 days   diclofenac Sodium 1 % Gel Commonly known as: Voltaren Apply 2 g topically 4 (four) times daily as needed.   dicyclomine 10 MG capsule Commonly known as: BENTYL TAKE 1 CAPSULE BY MOUTH EVERY 6 HOURS AS NEEDED FOR  ABDOMINAL  CRAMPING   furosemide 40 MG tablet Commonly known as: LASIX Take 1 tablet by mouth twice daily   gabapentin 600 MG tablet Commonly known as: NEURONTIN TAKE 1 TABLET BY MOUTH THREE TIMES DAILY   glucose blood test strip Use twice daily to check blood sugar.  DX E11.9   HumaLOG KwikPen 200 UNIT/ML KwikPen Generic drug: insulin lispro Max daily 100 units   levocetirizine 5 MG tablet Commonly known as: XYZAL TAKE 1 TABLET BY MOUTH ONCE DAILY IN THE EVENING   levothyroxine 125 MCG tablet Commonly known as: SYNTHROID Take 1 tablet (125 mcg total) by mouth daily.   losartan 50 MG tablet Commonly known as: COZAAR Take 1 tablet by mouth once daily   metFORMIN 500 MG 24 hr tablet Commonly known as: GLUCOPHAGE-XR Take 2 tablets  (1,000 mg total) by mouth 2 (two) times daily.   methylphenidate 20 MG tablet Commonly known as: RITALIN Take daily after lunch.   methylphenidate 20 MG 24 hr capsule Commonly known as: RITALIN LA Take 1 capsule (20 mg total) by mouth every morning. Take daily after lunch.   methylphenidate 20 MG 24 hr capsule Commonly known as: RITALIN LA Take 1 capsule (20 mg total) by mouth every morning.   methylphenidate 20 MG tablet Commonly known as: RITALIN Take 1 tab in the afternoon  daily.   methylphenidate 20 MG tablet Commonly known as: Ritalin Daily after lunch.   methylphenidate 20 MG 24 hr capsule Commonly known as: RITALIN LA Take 1 capsule (20 mg total) by mouth every morning.   montelukast 10 MG tablet Commonly known as: SINGULAIR TAKE 1 TABLET BY MOUTH AT BEDTIME   multivitamin-iron-minerals-folic acid chewable tablet Chew 1 tablet by mouth daily.   Omnipod 5 G6 Pod (Gen 5) Misc Change pod every 2 days   Omnipod 5 G6 Intro (Gen 5) Kit Change pod every 2 days   pantoprazole 40 MG tablet Commonly known as: PROTONIX TAKE 1 TABLET BY MOUTH TWICE DAILY BEFORE A MEAL   rOPINIRole 2 MG tablet Commonly known as: REQUIP TAKE 1 TABLET BY MOUTH IN THE MORNING AND 2 AT BEDTIME   triamcinolone 55 MCG/ACT Aero nasal inhaler Commonly known as: Nasacort Allergy 24HR Place 2 sprays into the nose daily.   triamterene-hydrochlorothiazide 37.5-25 MG tablet Commonly known as: MAXZIDE-25 Take 1 tablet by mouth daily.         OBJECTIVE:   Vital Signs: There were no vitals taken for this visit.  Wt Readings from Last 3 Encounters:  06/05/21 207 lb (93.9 kg)  02/26/21 205 lb 6.4 oz (93.2 kg)  12/03/20 216 lb (98 kg)     Exam: General: Pt appears well and is in NAD  Lungs: Clear with good BS bilat with no rales, rhonchi, or wheezes  Heart: RRR with normal S1 and S2 and no gallops; no murmurs; no rub  Abdomen: Normoactive bowel sounds, soft, nontender, without masses  or organomegaly palpable  Extremities: No pretibial edema.   Neuro: MS is good with appropriate affect, pt is alert and Ox3    DM foot exam: 10/16/2020  The skin of the feet is intact without sores or ulcerations. The pedal pulses are 2+ on right and 2+ on left. The sensation is intact to a screening 5.07, 10 gram monofilament bilaterally        DATA REVIEWED:  Lab Results  Component Value Date   HGBA1C 8.2 (A) 06/05/2021   HGBA1C 7.2 (A) 02/26/2021   HGBA1C 9.1 (A) 10/16/2020      Latest Reference Range & Units Most Recent  Sodium 135 - 145 mmol/L 141 12/29/20 16:52  Potassium 3.5 - 5.1 mmol/L 3.6 12/29/20 16:52  Chloride 98 - 111 mmol/L 103 12/29/20 16:52  CO2 22 - 32 mmol/L 28 12/29/20 16:52  Glucose 70 - 99 mg/dL 85 12/29/20 16:52  Mean Plasma Glucose (calc) 160 06/24/19 15:47  BUN 6 - 20 mg/dL 11 12/29/20 16:52  Creatinine 0.44 - 1.00 mg/dL 0.86 12/29/20 16:52  Calcium 8.9 - 10.3 mg/dL 9.4 12/29/20 16:52  Anion gap 5 - 15  10 12/29/20 16:52  Alkaline Phosphatase 38 - 126 U/L 120 12/29/20 16:52  Albumin 3.5 - 5.0 g/dL 3.9 12/29/20 16:52  Lipase 11 - 51 U/L 26 01/08/17 19:09  AST 15 - 41 U/L 25 12/29/20 16:52  ALT 0 - 44 U/L 25 12/29/20 16:52  Total Protein 6.5 - 8.1 g/dL 7.7 12/29/20 16:52  Bilirubin, Direct 0.0 - 0.3 mg/dL 0.0 10/16/16 16:15  Total Bilirubin 0.3 - 1.2 mg/dL 0.5 12/29/20 16:52  GGT 7 - 51 U/L 46 10/16/16 16:15  GFR >60.00 mL/min 56.65 (L) 08/15/20 16:27  GFR, Estimated >60 mL/min >60 12/29/20 16:52  GFR, Est Non African American >60 mL/min >60 01/08/17 19:09  GFR, Est African American >60 mL/min >60 01/08/17 19:09    Latest Reference Range &  Units Most Recent  Total CHOL/HDL Ratio  4 08/15/20 16:27  Cholesterol 0 - 200 mg/dL 128 08/15/20 16:27  HDL Cholesterol >39.00 mg/dL 36.00 (L) 08/15/20 16:27  LDL (calc) 0 - 99 mg/dL 45 02/01/20 16:07  Direct LDL mg/dL 70.0 08/15/20 16:27  MICROALB/CREAT RATIO 0.0 - 30.0 mg/g  2.7 02/01/20 16:07  NonHDL  92.02 08/15/20 16:27  Triglycerides 0.0 - 149.0 mg/dL 201.0 (H) 08/15/20 16:27  VLDL 0.0 - 40.0 mg/dL 40.2 (H) 08/15/20 16:27    Latest Reference Range & Units 06/05/21 16:03  TSH 0.35 - 5.50 uIU/mL 1.96  T4,Free(Direct) 0.60 - 1.60 ng/dL 1.17    ASSESSMENT / PLAN / RECOMMENDATIONS:   1) Type 2 Diabetes Mellitus, Optimally controlled, With Neuropathic  complications - Most recent A1c of 8.2 %. Goal A1c < 7.0 %.     -She had stopped Ozempic due to GI side effects, was unable to tolerate 0.25 mg -She is intolerant to Invokana due to recurrent yeast infections, she is scared of trying other alternatives of SGLT2 inhibitors. -She has self ordered OmniPod, she aborted today thinking she will get trained on it today.  I did explain to the patient that she will need a referral to our CDE for proper OmniPod training -Will increase basal insulin as below as well as her prandial dose of insulin  MEDICATIONS:   Continue metformin 500 mg XR 2 tabs twice daily Increase Toujeo 60 units twice daily Increase Humalog 20 units 3 times daily before every meal Correction factor: Humalog (BG -140/10)   EDUCATION / INSTRUCTIONS: BG monitoring instructions: Patient is instructed to check her blood sugars 3 times a day, before meals. Call [facility] if: BG persistently < 70  I reviewed the Rule of 15 for the treatment of hypoglycemia in detail with the patient. Literature supplied.    2) Diabetic complications:  Eye: Does not  have known diabetic retinopathy.  Neuro/ Feet: Does  have known diabetic peripheral neuropathy .  Renal: Patient does not have known baseline CKD. She   is  on an ACEI/ARB at present.    3) Hypothyroidism:  -TSH is normal on today's labs -No change today   Medication  Continue levothyroxine 125 mcg daily   F/U in 6 months   Signed electronically by: Mack Guise, MD  [facility]  [facility] [address] [place], [place] [address] Phone: [phone] FAX: [phone]   CC: Shelda Pal, [address] [place] [address] Phone: [phone]  Fax: [phone]  Return to Endocrinology clinic as below: Future Appointments  Date Time Provider [place]  10/09/2021  2:00 PM Ocie Doyne, RN [place] [facility]  10/09/2021  2:20 PM Gwenda Heiner, Melanie Crazier, MD [id number] None

## 2021-10-10 ENCOUNTER — Encounter: Payer: Self-pay | Admitting: Internal Medicine

## 2021-10-10 ENCOUNTER — Other Ambulatory Visit: Payer: Self-pay | Admitting: Family Medicine

## 2021-10-10 DIAGNOSIS — R053 Chronic cough: Secondary | ICD-10-CM

## 2021-10-10 NOTE — Progress Notes (Signed)
We discussed how this pod will deliver insulin and she reported good understanding of the difference between basal and bolus insulin. She is here with a vial of Humalog U100 insulin:  She reports that she thinks she did not take her Toujeo insulin last night and knows she did not take it this AM.  Settings were put into PDM by the patient, per dr. Quin Hoop orders:  Basal rate: 3.75u/hr , I/C: 1 (she does not know how to count carbs), ISF: 10, target 120 with correction over 120.  Timing 4 hours, max basal rate 7.5u/hr, max bolus 30.   She was trained on how to use this pump and all topics were discussed on the checklist, including needing to bolus for all foods eaten, high blood sugar protocols and need for corrections boluses and alerts and alarms.  She signed the checklist as understanding all topics. I phoned the [employer] rep. And she had [place] download a coupon for U-200 Humalog and patient is now able to pay for this at $35.00/month, instead of $2500.00.  She agreed to use this insulin when this current pod runs out, in 1.5 days.   She was shown how to make the changes to her pump settings, and written instructions given for reducing basal rate to 2.0u/hr, reducing bolus from her current 20u to 10 units, her ISF to change to 20., and timing to 6 hours.    She was shown how to fill the pod using a pen, and had no final questions.

## 2021-10-10 NOTE — Patient Instructions (Addendum)
Stop all toujeo insulin When changing your insulin to U 200 Humalog, change pump settings:  Basal rate: 2.0u/hr, sensitivity factor: 20, timing 6 hours.  Bolus for all meals and snacks eaten. Call Dexcom if having trouble linking readings to PDM Call OmniPod if questions about pump usage

## 2021-10-11 ENCOUNTER — Telehealth: Payer: Self-pay | Admitting: Nutrition

## 2021-10-11 NOTE — Telephone Encounter (Signed)
Patient reports that she has had no difficulty bolusing and blood sugar this AM was 143.  Blood sugar is now 286 because she ate more.  Discussed how she can give more insulin when eating more.  She was talked through giving a correction bolus and we discussed again the need to do this for the pump to work well.  She reported good understanding of this She refilled a pod last night, because she ran out of insulin, and filled it with U-100 Humalog again.  She wants to use this up before switching to U-200.  She had no final questions for me at this time.

## 2021-10-11 NOTE — Telephone Encounter (Signed)
Not able to leave voice message on cell number due to voice mail being full, but was a le to leave a voice mail on home number to call me to let me know how she is doing with her pump.

## 2021-10-12 ENCOUNTER — Other Ambulatory Visit: Payer: Self-pay | Admitting: Family Medicine

## 2021-10-12 DIAGNOSIS — R053 Chronic cough: Secondary | ICD-10-CM

## 2021-10-12 DIAGNOSIS — F988 Other specified behavioral and emotional disorders with onset usually occurring in childhood and adolescence: Secondary | ICD-10-CM

## 2021-10-14 MED ORDER — METHYLPHENIDATE HCL 20 MG PO TABS: ORAL_TABLET | ORAL | 0 refills | Status: DC

## 2021-10-14 MED ORDER — AZELASTINE HCL 137 MCG/SPRAY NA SOLN: 1.0000 | Freq: Two times a day (BID) | NASAL | 11 refills | Status: DC

## 2021-10-14 MED ORDER — METHYLPHENIDATE HCL ER (LA) 20 MG PO CP24: 20.0000 mg | ORAL_CAPSULE | ORAL | 0 refills | Status: DC

## 2021-10-14 NOTE — Telephone Encounter (Signed)
Could sched CPE or med review, she is due. Ty.

## 2021-10-14 NOTE — Telephone Encounter (Signed)
Called the patient no answer/mailbox full.

## 2021-10-15 NOTE — Telephone Encounter (Signed)
Called the patient no answer/mailbox full

## 2021-10-15 NOTE — Telephone Encounter (Signed)
Patient scheduled an appointment.

## 2021-10-22 ENCOUNTER — Encounter: Payer: Self-pay | Admitting: Family Medicine

## 2021-10-22 ENCOUNTER — Ambulatory Visit (INDEPENDENT_AMBULATORY_CARE_PROVIDER_SITE_OTHER): Payer: PRIVATE HEALTH INSURANCE | Admitting: Family Medicine

## 2021-10-22 VITALS — BP 122/80 | HR 62 | Temp 98.3°F | Ht 62.0 in | Wt 216.1 lb

## 2021-10-22 DIAGNOSIS — Z0000 Encounter for general adult medical examination without abnormal findings: Secondary | ICD-10-CM | POA: Diagnosis not present

## 2021-10-22 DIAGNOSIS — Z5320 Procedure and treatment not carried out because of patient's decision for unspecified reasons: Secondary | ICD-10-CM | POA: Diagnosis not present

## 2021-10-22 DIAGNOSIS — J40 Bronchitis, not specified as acute or chronic: Secondary | ICD-10-CM

## 2021-10-22 MED ORDER — PREDNISONE 20 MG PO TABS: 40.0000 mg | ORAL_TABLET | Freq: Every day | ORAL | 0 refills | Status: AC

## 2021-10-22 MED ORDER — GLYBURIDE 5 MG PO TABS: ORAL_TABLET | ORAL | 0 refills | Status: DC

## 2021-10-22 NOTE — Progress Notes (Signed)
Chief Complaint  Patient presents with   Annual Exam    Getting over URI, still has wheezing and drainage     Well Woman Cristina Burns is here for a complete physical.   Her last physical was >1 year ago.  Current diet: in general, diet is fair. Current exercise: none. Weight is stable and she denies fatigue out of ordinary. No LMP recorded. Patient has had a hysterectomy. Seatbelt? Yes Advanced directive? No  Health Maintenance Mammogram- Yes Colon cancer screening-No  Shingrix- Yes Tetanus- Yes Hep C screening- Yes HIV screening- Yes  URI Duration: 9 days  Associated symptoms: sinus congestion, rhinorrhea, ear pain, sore throat, wheezing, and coughing Denies: sinus pain, itchy watery eyes, ear pain, ear drainage, myalgia, and fevers Treatment to date: Mucinex, Elderberry, ibuprofen, Zycam Sick contacts: No  Past Medical History:  Diagnosis Date   ADHD (attention deficit hyperactivity disorder)    Allergic rhinitis    Allergy    Diabetes mellitus without complication (HCC)    Diabetic neuropathy ([place])    GERD (gastroesophageal reflux disease)    Hypertension    Hypothyroidism    RLS (restless legs syndrome)    Stomach ulcer "it's been a long time"     Past Surgical History:  Procedure Laterality Date   ABDOMINAL HYSTERECTOMY  2002   ACHILLES TENDON REPAIR  2009   [place] Right 2011    Medications  Current Outpatient Medications on File Prior to Visit  Medication Sig Dispense Refill   albuterol (VENTOLIN HFA) 108 (90 Base) MCG/ACT inhaler Inhale 1-2 puffs into the lungs every 6 (six) hours as needed for wheezing or shortness of breath. 6.7 g 0   amLODipine (NORVASC) 5 MG tablet Take 1 tablet (5 mg total) by mouth daily. 90 tablet 3   atorvastatin (LIPITOR) 40 MG tablet Take 1 tablet by mouth once daily 90 tablet 0   Azelastine HCl 137 MCG/SPRAY SOLN Place 1 spray into the nose in the morning and at bedtime. 30 mL 11   B  Complex-C (SUPER B COMPLEX PO) Take 1 capsule by mouth daily.     cholecalciferol (VITAMIN D) 1000 units tablet Take 1 tablet (1,000 Units total) by mouth daily. 30 tablet 0   Continuous Blood Gluc Receiver (DEXCOM G6 RECEIVER) DEVI Use to check blood sugar 1 each 0   Continuous Blood Gluc Sensor (DEXCOM G6 SENSOR) MISC Change every 10 days 6 each 3   Continuous Blood Gluc Transmit (DEXCOM G6 TRANSMITTER) MISC Change every 90 days 1 each 3   dicyclomine (BENTYL) 10 MG capsule TAKE 1 CAPSULE BY MOUTH EVERY 6 HOURS AS NEEDED FOR  ABDOMINAL  CRAMPING 60 capsule 0   furosemide (LASIX) 40 MG tablet Take 1 tablet by mouth twice daily 60 tablet 0   gabapentin (NEURONTIN) 600 MG tablet TAKE 1 TABLET BY MOUTH THREE TIMES DAILY 90 tablet 0   glucose blood test strip Use twice daily to check blood sugar.  DX E11.9     Insulin Disposable Pump (OMNIPOD 5 G6 INTRO, GEN 5,) KIT Change pod every 2 days 1 kit 0   Insulin Disposable Pump (OMNIPOD 5 G6 POD, GEN 5,) MISC Change pod every 2 days 15 each 2   insulin lispro (HUMALOG KWIKPEN) 200 UNIT/ML KwikPen Inject 80 Units into the skin daily at 6 (six) AM. 12 mL 11   levocetirizine (XYZAL) 5 MG tablet TAKE 1 TABLET BY MOUTH ONCE DAILY IN THE EVENING 90  tablet 0   levothyroxine (SYNTHROID) 125 MCG tablet Take 1 tablet (125 mcg total) by mouth daily. 90 tablet 3   losartan (COZAAR) 50 MG tablet Take 1 tablet by mouth once daily 30 tablet 0   metFORMIN (GLUCOPHAGE-XR) 500 MG 24 hr tablet Take 2 tablets (1,000 mg total) by mouth 2 (two) times daily. 360 tablet 3   methylphenidate (RITALIN LA) 20 MG 24 hr capsule Take 1 capsule (20 mg total) by mouth every morning. Take daily after lunch. 30 capsule 0   methylphenidate (RITALIN LA) 20 MG 24 hr capsule Take 1 capsule (20 mg total) by mouth every morning. 30 capsule 0   methylphenidate (RITALIN LA) 20 MG 24 hr capsule Take 1 capsule (20 mg total) by mouth every morning. 30 capsule 0   methylphenidate (RITALIN) 20 MG  tablet Take daily after lunch. 30 tablet 0   methylphenidate (RITALIN) 20 MG tablet Take 1 tab in the afternoon daily. 30 tablet 0   methylphenidate (RITALIN) 20 MG tablet Daily after lunch. 30 tablet 0   montelukast (SINGULAIR) 10 MG tablet TAKE 1 TABLET BY MOUTH AT BEDTIME 90 tablet 0   multivitamin-iron-minerals-folic acid (CENTRUM) chewable tablet Chew 1 tablet by mouth daily.     pantoprazole (PROTONIX) 40 MG tablet TAKE 1 TABLET BY MOUTH TWICE DAILY BEFORE A MEAL 180 tablet 0   rOPINIRole (REQUIP) 2 MG tablet TAKE 1 TABLET BY MOUTH IN THE MORNING AND 2 AT BEDTIME 90 tablet 0   triamcinolone (NASACORT ALLERGY 24HR) 55 MCG/ACT AERO nasal inhaler Place 2 sprays into the nose daily. 1 Inhaler 2   triamterene-hydrochlorothiazide (MAXZIDE-25) 37.5-25 MG tablet Take 1 tablet by mouth daily. 30 tablet 0   Allergies Allergies  Allergen Reactions   Codeine Hives   Darvon [Propoxyphene] Hives    "Darvocet"   Doxycycline     Double Vision   Erythromycin     Stomach pain   Keflex [Cephalexin] Hives    Review of Systems: Constitutional:  no unexpected weight changes Eye:  no recent significant change in vision Ear/Nose/Mouth/Throat:  Ears:  no recent change in hearing Nose/Mouth/Throat:  +nasal congestion, +sore throat Cardiovascular: no chest pain Respiratory:  +coughing/wheezing Gastrointestinal:  no abdominal pain, no change in bowel habits GU:  Female: negative for dysuria or pelvic pain Musculoskeletal/Extremities:  no pain of the joints Integumentary (Skin/Breast):  no abnormal skin lesions reported Neurologic:  no headaches Endocrine:  denies fatigue  Exam BP 122/80 (BP Location: Left Arm, Patient Position: Sitting, Cuff Size: Normal)   Pulse 62   Temp 98.3 F (36.8 C) (Oral)   Ht $R'5\' 2"'Lq$  (1.575 m)   Wt 216 lb 2 oz (98 kg)   SpO2 93%   BMI 39.53 kg/m  General:  well developed, well nourished, in no apparent distress Skin:  no significant moles, warts, or growths Head:   no masses, lesions, or tenderness Eyes:  pupils equal and round, sclera anicteric without injection Ears:  canals without lesions, TMs shiny without retraction, no obvious effusion, no erythema Nose:  nares patent, mucosa normal, and no drainage  Throat/Pharynx:  lips and gingiva without lesion; tongue and uvula midline; non-inflamed pharynx; no exudates or postnasal drainage Neck: neck supple without adenopathy, thyromegaly, or masses Lungs:  clear to auscultation, breath sounds equal bilaterally, no respiratory distress Cardio:  regular rate and rhythm, no LE edema Abdomen:  abdomen soft, nontender; bowel sounds normal; no masses or organomegaly Genital: Defer to GYN Musculoskeletal:  symmetrical muscle groups noted without  atrophy or deformity Extremities:  no clubbing, cyanosis, or edema, no deformities, no skin discoloration Neuro:  gait normal; deep tendon reflexes normal and symmetric Psych: well oriented with normal range of affect and appropriate judgment/insight  Assessment and Plan  Well adult exam - Plan: CBC, Comprehensive metabolic panel, Lipid panel  Wheezy bronchitis - Plan: glyBURIDE (DIABETA) 5 MG tablet, predniSONE (DELTASONE) 20 MG tablet  Colonoscopy refused   Well 61 y.o. female. Counseled on diet and exercise. Advanced directive form provided today.  Wheezy bronchitis: Acute, self-limited.  5-day prednisone burst 40 mg daily.  Glyburide 5 mg daily with every prednisone dose.  Hand hygiene encouraged.  Push fluids.  Follow-up if no improvement. She declined both colonoscopy and Cologuard testing.  Discussed the benefits of colon cancer screening and what he could do including saving her life in addition to medical issues down the road.  She will let us know if she changes her mind. Other orders as above. Follow up 6 mo. The patient voiced understanding and agreement to the plan.  [place], DO 10/22/21 4:58 PM

## 2021-10-22 NOTE — Patient Instructions (Signed)
Give [place] 2-3 business days to get the results of your labs back.   Let me know if you change your mind with the colonoscopy.  Continue to push fluids, practice good hand hygiene, and cover your mouth if you cough.  If you start having fevers, shaking or shortness of breath, seek immediate care.  OK to take Tylenol 1000 mg (2 extra strength tabs) or 975 mg (3 regular strength tabs) every 6 hours as needed.  Please get me a copy of your advanced directive form at your convenience.   Let us know if you need anything.

## 2021-10-23 LAB — CBC
HCT: 36.8 % (ref 36.0–46.0)
Hemoglobin: 12.2 g/dL (ref 12.0–15.0)
MCHC: 33.2 g/dL (ref 30.0–36.0)
MCV: 88.3 fl (ref 78.0–100.0)
Platelets: 333 10*3/uL (ref 150.0–400.0)
RBC: 4.17 Mil/uL (ref 3.87–5.11)
RDW: 15.2 % (ref 11.5–15.5)
WBC: 7.8 10*3/uL (ref 4.0–10.5)

## 2021-10-23 LAB — COMPREHENSIVE METABOLIC PANEL
ALT: 31 U/L (ref 0–35)
AST: 36 U/L (ref 0–37)
Albumin: 4.4 g/dL (ref 3.5–5.2)
Alkaline Phosphatase: 113 U/L (ref 39–117)
BUN: 22 mg/dL (ref 6–23)
CO2: 28 mEq/L (ref 19–32)
Calcium: 10 mg/dL (ref 8.4–10.5)
Chloride: 104 mEq/L (ref 96–112)
Creatinine, Ser: 1.04 mg/dL (ref 0.40–1.20)
GFR: 58.13 mL/min — ABNORMAL LOW (ref >60.00)
Glucose, Bld: 114 mg/dL — ABNORMAL HIGH (ref 70–99)
Potassium: 4.1 mEq/L (ref 3.5–5.1)
Sodium: 142 mEq/L (ref 135–145)
Total Bilirubin: 0.4 mg/dL (ref 0.2–1.2)
Total Protein: 7.3 g/dL (ref 6.0–8.3)

## 2021-10-23 LAB — LIPID PANEL
Cholesterol: 123 mg/dL (ref 0–200)
HDL: 38.8 mg/dL — ABNORMAL LOW (ref >39.00)
LDL Cholesterol: 46 mg/dL (ref 0–99)
NonHDL: 84.63
Total CHOL/HDL Ratio: 3
Triglycerides: 191 mg/dL — ABNORMAL HIGH (ref 0.0–149.0)
VLDL: 38.2 mg/dL (ref 0.0–40.0)

## 2021-10-26 ENCOUNTER — Other Ambulatory Visit: Payer: Self-pay | Admitting: Family Medicine

## 2021-10-26 DIAGNOSIS — I10 Essential (primary) hypertension: Secondary | ICD-10-CM

## 2021-10-27 ENCOUNTER — Other Ambulatory Visit: Payer: Self-pay | Admitting: Family Medicine

## 2021-10-28 ENCOUNTER — Ambulatory Visit: Payer: PRIVATE HEALTH INSURANCE | Admitting: Family Medicine

## 2021-10-30 ENCOUNTER — Other Ambulatory Visit: Payer: Self-pay | Admitting: Family Medicine

## 2021-10-30 ENCOUNTER — Encounter: Payer: Self-pay | Admitting: Internal Medicine

## 2021-10-30 DIAGNOSIS — G2581 Restless legs syndrome: Secondary | ICD-10-CM

## 2021-10-30 DIAGNOSIS — E1165 Type 2 diabetes mellitus with hyperglycemia: Secondary | ICD-10-CM

## 2021-11-07 ENCOUNTER — Other Ambulatory Visit: Payer: Self-pay | Admitting: Family Medicine

## 2021-11-07 DIAGNOSIS — L299 Pruritus, unspecified: Secondary | ICD-10-CM

## 2021-11-21 ENCOUNTER — Other Ambulatory Visit: Payer: Self-pay | Admitting: Family Medicine

## 2021-11-21 DIAGNOSIS — L299 Pruritus, unspecified: Secondary | ICD-10-CM

## 2021-11-22 ENCOUNTER — Other Ambulatory Visit: Payer: Self-pay | Admitting: Family Medicine

## 2021-11-22 MED ORDER — ALBUTEROL SULFATE HFA 108 (90 BASE) MCG/ACT IN AERS: 1.0000 | INHALATION_SPRAY | Freq: Four times a day (QID) | RESPIRATORY_TRACT | 0 refills | Status: AC | PRN

## 2021-11-25 ENCOUNTER — Other Ambulatory Visit: Payer: Self-pay | Admitting: Family Medicine

## 2021-11-25 DIAGNOSIS — R109 Unspecified abdominal pain: Secondary | ICD-10-CM

## 2021-11-30 ENCOUNTER — Other Ambulatory Visit: Payer: Self-pay | Admitting: Family Medicine

## 2021-11-30 DIAGNOSIS — E1165 Type 2 diabetes mellitus with hyperglycemia: Secondary | ICD-10-CM

## 2021-11-30 DIAGNOSIS — G2581 Restless legs syndrome: Secondary | ICD-10-CM

## 2021-12-13 ENCOUNTER — Other Ambulatory Visit: Payer: Self-pay | Admitting: Family Medicine

## 2021-12-13 DIAGNOSIS — G2581 Restless legs syndrome: Secondary | ICD-10-CM

## 2021-12-13 DIAGNOSIS — E1165 Type 2 diabetes mellitus with hyperglycemia: Secondary | ICD-10-CM

## 2021-12-13 DIAGNOSIS — F988 Other specified behavioral and emotional disorders with onset usually occurring in childhood and adolescence: Secondary | ICD-10-CM

## 2021-12-14 MED ORDER — GABAPENTIN 600 MG PO TABS: 600.0000 mg | ORAL_TABLET | Freq: Three times a day (TID) | ORAL | 0 refills | Status: DC

## 2021-12-14 MED ORDER — ROPINIROLE HCL 2 MG PO TABS: ORAL_TABLET | ORAL | 0 refills | Status: DC

## 2021-12-16 MED ORDER — METHYLPHENIDATE HCL ER (LA) 20 MG PO CP24: 20.0000 mg | ORAL_CAPSULE | ORAL | 0 refills | Status: DC

## 2021-12-16 MED ORDER — METHYLPHENIDATE HCL 20 MG PO TABS: ORAL_TABLET | ORAL | 0 refills | Status: DC

## 2021-12-16 NOTE — Telephone Encounter (Signed)
Requesting: Ritalin '20mg'$  and Ritalin LA '20mg'$  Contract: 02/10/21 UDS: 02/10/21 Last Visit: 10/22/21 Next Visit: none Last Refill: 10/14/21  Please Advise

## 2021-12-17 ENCOUNTER — Telehealth: Payer: Self-pay | Admitting: Family Medicine

## 2021-12-17 NOTE — Telephone Encounter (Signed)
PCP clarified the Ritalin XR is taken  in the morning and the Plain ritalin after lunch Called the pharmacy to clarifiy.

## 2021-12-17 NOTE — Telephone Encounter (Signed)
error 

## 2021-12-17 NOTE — Telephone Encounter (Signed)
Kenn File from [facility] calling to get clarification on the methylphenidate (RITALIN LA) 20 MG 24 hr capsule as there are two different prescriptions. Pharmacy would like to know what the instructions are for this patient. Please call back at [phone] to advise.

## 2021-12-17 NOTE — Telephone Encounter (Signed)
On hold for 18 mins with [facility]--hung up due to patient care in the clinic.

## 2021-12-18 NOTE — Telephone Encounter (Signed)
Called again and on hold unable to get through to pharmacist.

## 2021-12-19 ENCOUNTER — Other Ambulatory Visit: Payer: Self-pay | Admitting: Family Medicine

## 2021-12-19 DIAGNOSIS — K219 Gastro-esophageal reflux disease without esophagitis: Secondary | ICD-10-CM

## 2021-12-22 ENCOUNTER — Other Ambulatory Visit: Payer: Self-pay | Admitting: Family Medicine

## 2021-12-22 DIAGNOSIS — R109 Unspecified abdominal pain: Secondary | ICD-10-CM

## 2021-12-23 NOTE — Telephone Encounter (Signed)
Pharmacist called back and clarified.

## 2021-12-27 ENCOUNTER — Other Ambulatory Visit: Payer: Self-pay | Admitting: Family Medicine

## 2021-12-27 DIAGNOSIS — I10 Essential (primary) hypertension: Secondary | ICD-10-CM

## 2021-12-29 ENCOUNTER — Other Ambulatory Visit: Payer: Self-pay | Admitting: Family Medicine

## 2021-12-29 DIAGNOSIS — G2581 Restless legs syndrome: Secondary | ICD-10-CM

## 2022-01-18 ENCOUNTER — Other Ambulatory Visit: Payer: Self-pay | Admitting: Internal Medicine

## 2022-01-18 ENCOUNTER — Other Ambulatory Visit: Payer: Self-pay | Admitting: Family Medicine

## 2022-01-18 DIAGNOSIS — R109 Unspecified abdominal pain: Secondary | ICD-10-CM

## 2022-01-18 DIAGNOSIS — G2581 Restless legs syndrome: Secondary | ICD-10-CM

## 2022-01-21 ENCOUNTER — Other Ambulatory Visit: Payer: Self-pay | Admitting: Family Medicine

## 2022-01-28 ENCOUNTER — Other Ambulatory Visit: Payer: Self-pay | Admitting: Family Medicine

## 2022-01-28 DIAGNOSIS — E1165 Type 2 diabetes mellitus with hyperglycemia: Secondary | ICD-10-CM

## 2022-01-28 DIAGNOSIS — I10 Essential (primary) hypertension: Secondary | ICD-10-CM

## 2022-01-30 ENCOUNTER — Other Ambulatory Visit: Payer: Self-pay | Admitting: Family Medicine

## 2022-02-05 ENCOUNTER — Telehealth: Payer: Self-pay

## 2022-02-05 NOTE — Telephone Encounter (Signed)
Dexcom sensors need PA

## 2022-02-19 ENCOUNTER — Other Ambulatory Visit (HOSPITAL_COMMUNITY): Payer: Self-pay

## 2022-02-20 ENCOUNTER — Other Ambulatory Visit (HOSPITAL_BASED_OUTPATIENT_CLINIC_OR_DEPARTMENT_OTHER): Payer: Self-pay

## 2022-02-21 NOTE — Telephone Encounter (Signed)
Attempted to call patient to schedule appointment, but mailbox is full.  MyChart message sent to patient requesting her to call office to schedule appointment.

## 2022-02-24 ENCOUNTER — Ambulatory Visit (INDEPENDENT_AMBULATORY_CARE_PROVIDER_SITE_OTHER): Payer: 59 | Admitting: Internal Medicine

## 2022-02-24 ENCOUNTER — Encounter: Payer: Self-pay | Admitting: Internal Medicine

## 2022-02-24 VITALS — BP 134/80 | HR 96 | Ht 62.0 in | Wt 224.8 lb

## 2022-02-24 DIAGNOSIS — Z794 Long term (current) use of insulin: Secondary | ICD-10-CM | POA: Diagnosis not present

## 2022-02-24 DIAGNOSIS — E1165 Type 2 diabetes mellitus with hyperglycemia: Secondary | ICD-10-CM

## 2022-02-24 DIAGNOSIS — E039 Hypothyroidism, unspecified: Secondary | ICD-10-CM

## 2022-02-24 LAB — POCT GLYCOSYLATED HEMOGLOBIN (HGB A1C): Hemoglobin A1C: 8.8 % — AB (ref 4.0–5.6)

## 2022-02-24 MED ORDER — DEXCOM G7 SENSOR MISC: 1.0000 | 3 refills | Status: DC

## 2022-02-24 MED ORDER — TOUJEO SOLOSTAR 300 UNIT/ML ~~LOC~~ SOPN: 64.0000 [IU] | PEN_INJECTOR | Freq: Two times a day (BID) | SUBCUTANEOUS | 2 refills | Status: DC

## 2022-02-24 NOTE — Progress Notes (Signed)
Name: [name]  Age/ Sex: 62 y.o., female   MRN/ DOB: [id number], August 08, 1960     PCP: Shelda Pal, DO   Reason for Endocrinology Evaluation: Type 2 Diabetes Mellitus  Initial Endocrine Consultative Visit: 05/08/2020    PATIENT IDENTIFIER: [name] is a 62 y.o. female with a past medical history of T2DM, RLS, Hypothyroidism and HTN. The patient has followed with Endocrinology clinic since 05/08/2020 for consultative assistance with management of her diabetes.  DIABETIC HISTORY:  Ms. Spindler was diagnosed with DM in 8032, Trulicity - stomach issues. Invokana- recurrent yeast infections. Her hemoglobin A1c has ranged from  7.2%in 2021, peaking at 10.6% in 2018.   On her initial visit to our clinic her A1c 7.8%, she was on Glimepiride, MDI regimen and Ozempic. We stopped Glimepiride, continued Metfomrin,MDI regimen and decreased Ozempic due to nausea    Invokana caused  yeast infection   She stopped Ozempic it by her visit in 09/2020 She was trained on OmniPod use 09/2021 but was unable to sustain it due to cost issues  SUBJECTIVE:   During the last visit (02/26/2021): A1c 7.2 % .Continue metformin, lantus , humalog     Today (02/24/2022): [name]  is here for a follow up on diabetes management. She checks her blood sugars 3 times daily . The patient has not had hypoglycemic episodes since the last clinic visit.   She was seen by sports medicine 02/13/2022 for lumbar spondylosis, pending PT evaluation   Denies nausea, vomiting or  diarrhea   She has not been able to obtain the OmniPod nor Dexcom due to cost and insurance issues    HOME DIABETES REGIMEN:  Metformin 500 mg XR 2 tabs twice daily Toujeo 60 units BID  Humalog 20 units TIDQAC CF: Humalog (BG-140/10)       Statin: yes ACE-I/ARB: yes Prior Diabetic Education: yes    Meter : n/a    DIABETIC COMPLICATIONS: Microvascular complications:  Neuropathy Denies: CKD, retinopathy Last  Eye Exam: Completed 2023  Macrovascular complications:   Denies: CAD, CVA, PVD   HISTORY:  Past Medical History:  Past Medical History:  Diagnosis Date   ADHD (attention deficit hyperactivity disorder)    Allergic rhinitis    Allergy    Diabetes mellitus without complication ([place])    Diabetic neuropathy ([place])    GERD (gastroesophageal reflux disease)    Hypertension    Hypothyroidism    RLS (restless legs syndrome)    Stomach ulcer "it's been a long time"   Past Surgical History:  Past Surgical History:  Procedure Laterality Date   ABDOMINAL HYSTERECTOMY  2002   ACHILLES TENDON REPAIR  2009   CHOLECYSTECTOMY  1982   ROTATOR CUFF REPAIR Right 2011   Social History:  reports that she quit smoking about 39 years ago. Her smoking use included cigarettes. She started smoking about 48 years ago. She smoked an average of 2 packs per day. She has never used smokeless tobacco. She reports that she does not drink alcohol and does not use drugs. Family History:  Family History  Problem Relation Age of Onset   Heart disease Father    Heart attack Father    Heart disease Brother        CABG   Diabetes Brother    Kidney disease Brother    Cirrhosis Brother    Hypertension Brother    Heart attack Brother    Heart disease Brother    Diabetes Brother  Heart attack Brother    Diabetes Mother    Breast cancer Mother    Stroke Mother      HOME MEDICATIONS: Allergies as of 02/24/2022       Reactions   Codeine Hives   Darvon [propoxyphene] Hives   "Darvocet"   Doxycycline    Double Vision   Erythromycin    Stomach pain   Keflex [cephalexin] Hives        Medication List        Accurate as of February 24, 2022  3:04 PM. If you have any questions, ask your nurse or doctor.          albuterol 108 (90 Base) MCG/ACT inhaler Commonly known as: VENTOLIN HFA Inhale 1-2 puffs into the lungs every 6 (six) hours as needed for wheezing or shortness of breath.    amLODipine 5 MG tablet Commonly known as: NORVASC Take 1 tablet by mouth once daily   atorvastatin 40 MG tablet Commonly known as: LIPITOR Take 1 tablet by mouth once daily   Azelastine HCl 137 MCG/SPRAY Soln Place 1 spray into the nose in the morning and at bedtime.   celecoxib 200 MG capsule Commonly known as: CELEBREX Take 200 mg by mouth daily.   cholecalciferol 25 MCG (1000 UNIT) tablet Commonly known as: VITAMIN D3 Take 1 tablet (1,000 Units total) by mouth daily.   Dexcom G6 Receiver Devi Use to check blood sugar   Dexcom G6 Sensor Misc Change every 10 days   Dexcom G6 Transmitter Misc Change every 90 days   dicyclomine 10 MG capsule Commonly known as: BENTYL TAKE 1 CAPSULE BY MOUTH EVERY 6 HOURS AS NEEDED FOR  ABDOMINAL  CRAMPING   dicyclomine 10 MG capsule Commonly known as: BENTYL TAKE 1 CAPSULE BY MOUTH EVERY 6 HOURS AS NEEDED FOR  ABDOMINAL  CRAMPING   furosemide 40 MG tablet Commonly known as: LASIX Take 1 tablet by mouth twice daily   gabapentin 600 MG tablet Commonly known as: NEURONTIN TAKE 1 TABLET BY MOUTH THREE TIMES DAILY   glucose blood test strip Use twice daily to check blood sugar.  DX E11.9   glyBURIDE 5 MG tablet Commonly known as: DIABETA Take 1 tab daily with each dose of prednisone.   HumaLOG KwikPen 200 UNIT/ML KwikPen Generic drug: insulin lispro Inject 80 Units into the skin daily at 6 (six) AM. What changed:  how much to take when to take this additional instructions   levocetirizine 5 MG tablet Commonly known as: XYZAL TAKE 1 TABLET BY MOUTH ONCE DAILY IN THE EVENING   levothyroxine 125 MCG tablet Commonly known as: SYNTHROID Take 1 tablet by mouth once daily   losartan 50 MG tablet Commonly known as: COZAAR Take 1 tablet by mouth once daily   metFORMIN 500 MG 24 hr tablet Commonly known as: GLUCOPHAGE-XR Take 2 tablets (1,000 mg total) by mouth 2 (two) times daily.   methylphenidate 20 MG 24 hr  capsule Commonly known as: RITALIN LA Take 1 capsule (20 mg total) by mouth every morning. What changed: Another medication with the same name was changed. Make sure you understand how and when to take each.   methylphenidate 20 MG tablet Commonly known as: RITALIN Take 1 tab in the afternoon daily. What changed: Another medication with the same name was changed. Make sure you understand how and when to take each.   methylphenidate 20 MG tablet Commonly known as: Ritalin Daily after lunch. What changed: Another medication with the same  name was changed. Make sure you understand how and when to take each.   methylphenidate 20 MG 24 hr capsule Commonly known as: RITALIN LA Take 1 capsule (20 mg total) by mouth every morning. Take daily after lunch. What changed: additional instructions   methylphenidate 20 MG 24 hr capsule Commonly known as: RITALIN LA Take 1 capsule (20 mg total) by mouth every morning. What changed: Another medication with the same name was changed. Make sure you understand how and when to take each.   methylphenidate 20 MG tablet Commonly known as: RITALIN Take daily after lunch. What changed: Another medication with the same name was changed. Make sure you understand how and when to take each.   montelukast 10 MG tablet Commonly known as: SINGULAIR TAKE 1 TABLET BY MOUTH AT BEDTIME   multivitamin-iron-minerals-folic acid chewable tablet Chew 1 tablet by mouth daily.   Omnipod 5 G6 Pod (Gen 5) Misc Change pod every 2 days   Omnipod 5 G6 Intro (Gen 5) Kit Change pod every 2 days   pantoprazole 40 MG tablet Commonly known as: PROTONIX Take 1 tablet (40 mg total) by mouth 2 (two) times daily before a meal.   rOPINIRole 2 MG tablet Commonly known as: REQUIP TAKE 1 TABLET BY MOUTH IN THE MORNING AND 2 AT BEDTIME   SUPER B COMPLEX PO Take 1 capsule by mouth daily.   Toujeo SoloStar 300 UNIT/ML Solostar Pen Generic drug: insulin glargine (1 Unit  Dial) Inject 60 Units into the skin in the morning and at bedtime.   triamcinolone 55 MCG/ACT Aero nasal inhaler Commonly known as: Nasacort Allergy 24HR Place 2 sprays into the nose daily.   triamterene-hydrochlorothiazide 37.5-25 MG tablet Commonly known as: MAXZIDE-25 TAKE 1 TABLET BY MOUTH ONCE DAILY . APPOINTMENT REQUIRED FOR FUTURE REFILLS         OBJECTIVE:   Vital Signs: BP 134/80 (BP Location: Left Arm, Patient Position: Sitting, Cuff Size: Large)   Pulse 96   Ht '5\' 2"'$  (1.575 m)   Wt 224 lb 12.8 oz (102 kg)   SpO2 96%   BMI 41.12 kg/m   Wt Readings from Last 3 Encounters:  02/24/22 224 lb 12.8 oz (102 kg)  10/22/21 216 lb 2 oz (98 kg)  06/05/21 207 lb (93.9 kg)     Exam: General: Pt appears well and is in NAD  Lungs: Clear with good BS bilat with no rales, rhonchi, or wheezes  Heart: RRR   Abdomen: soft, nontender  Extremities: 1+ pretibial edema.   Neuro: MS is good with appropriate affect, pt is alert and Ox3    DM foot exam: 02/24/2022  The skin of the feet is intact without sores or ulcerations. The pedal pulses are 2+ on right and 2+ on left. The sensation is intact to a screening 5.07, 10 gram monofilament bilaterally        DATA REVIEWED:  Lab Results  Component Value Date   HGBA1C 8.8 (A) 02/24/2022   HGBA1C 8.2 (A) 06/05/2021   HGBA1C 7.2 (A) 02/26/2021      Latest Reference Range & Units Most Recent  Sodium 135 - 145 mmol/L 141 12/29/20 16:52  Potassium 3.5 - 5.1 mmol/L 3.6 12/29/20 16:52  Chloride 98 - 111 mmol/L 103 12/29/20 16:52  CO2 22 - 32 mmol/L 28 12/29/20 16:52  Glucose 70 - 99 mg/dL 85 12/29/20 16:52  Mean Plasma Glucose (calc) 160 06/24/19 15:47  BUN 6 - 20 mg/dL 11 12/29/20 16:52  Creatinine 0.44 -  1.00 mg/dL 0.86 12/29/20 16:52  Calcium 8.9 - 10.3 mg/dL 9.4 12/29/20 16:52  Anion gap 5 - 15  10 12/29/20 16:52  Alkaline Phosphatase 38 - 126 U/L 120 12/29/20 16:52  Albumin 3.5 - 5.0 g/dL 3.9 12/29/20 16:52   Lipase 11 - 51 U/L 26 01/08/17 19:09  AST 15 - 41 U/L 25 12/29/20 16:52  ALT 0 - 44 U/L 25 12/29/20 16:52  Total Protein 6.5 - 8.1 g/dL 7.7 12/29/20 16:52  Bilirubin, Direct 0.0 - 0.3 mg/dL 0.0 10/16/16 16:15  Total Bilirubin 0.3 - 1.2 mg/dL 0.5 12/29/20 16:52  GGT 7 - 51 U/L 46 10/16/16 16:15  GFR >60.00 mL/min 56.65 (L) 08/15/20 16:27  GFR, Estimated >60 mL/min >60 12/29/20 16:52  GFR, Est Non African American >60 mL/min >60 01/08/17 19:09  GFR, Est African American >60 mL/min >60 01/08/17 19:09    Latest Reference Range & Units Most Recent  Total CHOL/HDL Ratio  4 08/15/20 16:27  Cholesterol 0 - 200 mg/dL 128 08/15/20 16:27  HDL Cholesterol >39.00 mg/dL 36.00 (L) 08/15/20 16:27  LDL (calc) 0 - 99 mg/dL 45 02/01/20 16:07  Direct LDL mg/dL 70.0 08/15/20 16:27  MICROALB/CREAT RATIO 0.0 - 30.0 mg/g 2.7 02/01/20 16:07  NonHDL  92.02 08/15/20 16:27  Triglycerides 0.0 - 149.0 mg/dL 201.0 (H) 08/15/20 16:27  VLDL 0.0 - 40.0 mg/dL 40.2 (H) 08/15/20 16:27    Latest Reference Range & Units 06/05/21 16:03  TSH 0.35 - 5.50 uIU/mL 1.96  T4,Free(Direct) 0.60 - 1.60 ng/dL 1.17    ASSESSMENT / PLAN / RECOMMENDATIONS:   1) Type 2 Diabetes Mellitus, Optimally controlled, With Neuropathic  complications - Most recent A1c of 8.8 %. Goal A1c < 7.0 %.     -She had stopped Ozempic due to GI side effects, was unable to tolerate 0.25 mg -She is intolerant to Invokana due to recurrent yeast infections, she is scared of trying other alternatives of SGLT2 inhibitors. -She was trained on OmniPod but unfortunately it was cost prohibitive, tandem is cost prohibitive as well -She will remain on multiple daily injections -A prescription for Dexcom G7 has been sent -She was given samples of Mounjaro 2.5 mg weekly to try, patient to request a prescription if she tolerates it   MEDICATIONS:   Start Mounjaro 2.5 mg weekly (samples) Continue metformin 500 mg XR 2 tabs twice daily Increase Toujeo  64 units twice daily Continue Humalog 20 units 3 times daily before every meal Continue correction factor: Humalog (BG -140/10)   EDUCATION / INSTRUCTIONS: BG monitoring instructions: Patient is instructed to check her blood sugars 3 times a day, before meals. Call [facility] if: BG persistently < 70  I reviewed the Rule of 15 for the treatment of hypoglycemia in detail with the patient. Literature supplied.    2) Diabetic complications:  Eye: Does not  have known diabetic retinopathy.  Neuro/ Feet: Does  have known diabetic peripheral neuropathy .  Renal: Patient does not have known baseline CKD. She   is  on an ACEI/ARB at present.    3) Hypothyroidism:  -Patient is clinically euthyroid -No change today   Medication  Continue levothyroxine 125 mcg daily   F/U in 1 months   Signed electronically by: Mack Guise, MD  [facility]  [facility] [address] [place], [place] [address] Phone: [phone] FAX: [phone]   CC: Shelda Pal, [address] [place] [address] Phone: [phone]  Fax: [phone]  Return to Endocrinology clinic as below: No future appointments.

## 2022-02-24 NOTE — Patient Instructions (Addendum)
-  Start Mounjaro 2.5 mg once weekly  -  Continue Metformin 500 mg 2 tabs Twice a day  - INcrease Toujeo 64  units twice a day  -  Continue Humalog 20 units with each meal  - Humalog correctional insulin: ADD extra units on insulin to your meal-time Humalog  dose if your blood sugars are higher than 150. Use the scale below to help guide you:   Blood sugar before meal Number of units to inject  Less than 150 0 unit   151 -  160 1 units  161 -  170 2 units  171 -  180 3 units  181 -  190 4 units  191 -  200 5 units  201 -  210 6 units  211 -  220 7 units  221 -  230 8 units  231 - 240       HOW TO TREAT LOW BLOOD SUGARS (Blood sugar LESS THAN 70 MG/DL) Please follow the RULE OF 15 for the treatment of hypoglycemia treatment (when your (blood sugars are less than 70 mg/dL)   STEP 1: Take 15 grams of carbohydrates when your blood sugar is low, which includes:  3-4 GLUCOSE TABS  OR 3-4 OZ OF JUICE OR REGULAR SODA OR ONE TUBE OF GLUCOSE GEL    STEP 2: RECHECK blood sugar in 15 MINUTES STEP 3: If your blood sugar is still low at the 15 minute recheck --> then, go back to STEP 1 and treat AGAIN with another 15 grams of carbohydrates.

## 2022-02-26 ENCOUNTER — Other Ambulatory Visit: Payer: Self-pay | Admitting: Family Medicine

## 2022-02-26 MED ORDER — TRIAMTERENE-HCTZ 37.5-25 MG PO TABS: 1.0000 | ORAL_TABLET | Freq: Every day | ORAL | 3 refills | Status: DC

## 2022-03-01 ENCOUNTER — Other Ambulatory Visit: Payer: Self-pay | Admitting: Family Medicine

## 2022-03-01 DIAGNOSIS — E1165 Type 2 diabetes mellitus with hyperglycemia: Secondary | ICD-10-CM

## 2022-03-01 DIAGNOSIS — I10 Essential (primary) hypertension: Secondary | ICD-10-CM

## 2022-03-27 ENCOUNTER — Encounter: Payer: Self-pay | Admitting: Internal Medicine

## 2022-03-28 MED ORDER — TIRZEPATIDE 5 MG/0.5ML ~~LOC~~ SOAJ: 5.0000 mg | SUBCUTANEOUS | 3 refills | Status: DC

## 2022-03-29 ENCOUNTER — Other Ambulatory Visit: Payer: Self-pay | Admitting: Family Medicine

## 2022-03-29 DIAGNOSIS — I10 Essential (primary) hypertension: Secondary | ICD-10-CM

## 2022-03-29 DIAGNOSIS — E1165 Type 2 diabetes mellitus with hyperglycemia: Secondary | ICD-10-CM

## 2022-03-29 DIAGNOSIS — Z794 Long term (current) use of insulin: Secondary | ICD-10-CM

## 2022-04-01 NOTE — Progress Notes (Signed)
Name: Cristina Burns  Age/ Sex: 62 y.o., female   MRN/ DOB: [id number], 10/24/1960     PCP: Shelda Pal, DO   Reason for Endocrinology Evaluation: Type 2 Diabetes Mellitus  Initial Endocrine Consultative Visit: 05/08/2020    PATIENT IDENTIFIER: [name] is a 62 y.o. female with a past medical history of T2DM, RLS, Hypothyroidism and HTN. The patient has followed with Endocrinology clinic since 05/08/2020 for consultative assistance with management of her diabetes.  DIABETIC HISTORY:  Cristina Burns was diagnosed with DM in [id number], Trulicity - stomach issues. Invokana- recurrent yeast infections. Her hemoglobin A1c has ranged from  7.2%in 2021, peaking at 10.6% in 2018.   On her initial visit to our clinic her A1c 7.8%, she was on Glimepiride, MDI regimen and Ozempic. We stopped Glimepiride, continued Metfomrin,MDI regimen and decreased Ozempic due to nausea    Invokana caused  yeast infection   She stopped Ozempic it by her visit in 09/2020 She was trained on OmniPod use 09/2021 but was unable to sustain it due to cost issues  Started [facility] 02/2022  SUBJECTIVE:   During the last visit (02/24/2022): A1c 8.8 %     Today (04/02/2022): Cristina Burns  is here for a follow up on diabetes management. She checks her blood sugars 3 times daily . The patient has not had hypoglycemic episodes since the last clinic visit.  She was seen by physical medicine and rehab spine 03/24/2022 for back pain Denies nausea, vomiting or  diarrhea   She has not picked up mounjaro this week due to cost $550    HOME DIABETES REGIMEN:  Metformin 500 mg XR 2 tabs twice daily Mounjaro 2.5 mg weekly  Toujeo 64 units BID  Humalog 20 units TIDQAC CF: Humalog (BG-140/10)     Statin: yes ACE-I/ARB: yes Prior Diabetic Education: yes    CONTINUOUS GLUCOSE MONITORING RECORD INTERPRETATION    Dates of Recording: 2/29-3/13/2024  Sensor description: dexcom  Results statistics:   CGM use %  of time 86  Average and SD 173/54  Time in range      62  %  % Time Above 180 28  % Time above 250 9  % Time Below target <1   Glycemic patterns summary: BG's optimal at night but trend up during the day   Hyperglycemic episodes  postprandial   Hypoglycemic episodes occurred n/a  Overnight periods:  optimal most of the time     DIABETIC COMPLICATIONS: Microvascular complications:  Neuropathy Denies: CKD, retinopathy Last Eye Exam: Completed 2023  Macrovascular complications:   Denies: CAD, CVA, PVD   HISTORY:  Past Medical History:  Past Medical History:  Diagnosis Date   ADHD (attention deficit hyperactivity disorder)    Allergic rhinitis    Allergy    Diabetes mellitus without complication (HCC)    Diabetic neuropathy (HCC)    GERD (gastroesophageal reflux disease)    Hypertension    Hypothyroidism    RLS (restless legs syndrome)    Stomach ulcer "it's been a long time"   Past Surgical History:  Past Surgical History:  Procedure Laterality Date   ABDOMINAL HYSTERECTOMY  2002   ACHILLES TENDON REPAIR  2009   CHOLECYSTECTOMY  1982   ROTATOR CUFF REPAIR Right 2011   Social History:  reports that she quit smoking about 39 years ago. Her smoking use included cigarettes. She started smoking about 48 years ago. She smoked an average of 2 packs per day. She has never used  smokeless tobacco. She reports that she does not drink alcohol and does not use drugs. Family History:  Family History  Problem Relation Age of Onset   Heart disease Father    Heart attack Father    Heart disease Brother        CABG   Diabetes Brother    Kidney disease Brother    Cirrhosis Brother    Hypertension Brother    Heart attack Brother    Heart disease Brother    Diabetes Brother    Heart attack Brother    Diabetes Mother    Breast cancer Mother    Stroke Mother      HOME MEDICATIONS: Allergies as of 04/02/2022       Reactions   Codeine Hives   Darvon [propoxyphene]  Hives   "Darvocet"   Doxycycline    Double Vision   Erythromycin    Stomach pain   Keflex [cephalexin] Hives        Medication List        Accurate as of April 02, 2022  7:39 AM. If you have any questions, ask your nurse or doctor.          albuterol 108 (90 Base) MCG/ACT inhaler Commonly known as: VENTOLIN HFA Inhale 1-2 puffs into the lungs every 6 (six) hours as needed for wheezing or shortness of breath.   amLODipine 5 MG tablet Commonly known as: NORVASC Take 1 tablet by mouth once daily   atorvastatin 40 MG tablet Commonly known as: LIPITOR Take 1 tablet by mouth once daily   Azelastine HCl 137 MCG/SPRAY Soln Place 1 spray into the nose in the morning and at bedtime.   celecoxib 200 MG capsule Commonly known as: CELEBREX Take 200 mg by mouth daily.   cholecalciferol 25 MCG (1000 UT) tablet Generic drug: Cholecalciferol Take 1 tablet (1,000 Units total) by mouth daily.   Dexcom G7 Sensor Misc 1 Device by Does not apply route as directed.   dicyclomine 10 MG capsule Commonly known as: BENTYL TAKE 1 CAPSULE BY MOUTH EVERY 6 HOURS AS NEEDED FOR  ABDOMINAL  CRAMPING   dicyclomine 10 MG capsule Commonly known as: BENTYL TAKE 1 CAPSULE BY MOUTH EVERY 6 HOURS AS NEEDED FOR  ABDOMINAL  CRAMPING   furosemide 40 MG tablet Commonly known as: LASIX Take 1 tablet by mouth twice daily   gabapentin 600 MG tablet Commonly known as: NEURONTIN TAKE 1 TABLET BY MOUTH THREE TIMES DAILY   glucose blood test strip Use twice daily to check blood sugar.  DX E11.9   glyBURIDE 5 MG tablet Commonly known as: DIABETA Take 1 tab daily with each dose of prednisone.   HumaLOG KwikPen 200 UNIT/ML KwikPen Generic drug: insulin lispro Inject 80 Units into the skin daily at 6 (six) AM. What changed:  how much to take when to take this additional instructions   levocetirizine 5 MG tablet Commonly known as: XYZAL TAKE 1 TABLET BY MOUTH ONCE DAILY IN THE EVENING    levothyroxine 125 MCG tablet Commonly known as: SYNTHROID Take 1 tablet by mouth once daily   losartan 50 MG tablet Commonly known as: COZAAR Take 1 tablet by mouth once daily   metFORMIN 500 MG 24 hr tablet Commonly known as: GLUCOPHAGE-XR Take 2 tablets (1,000 mg total) by mouth 2 (two) times daily.   methylphenidate 20 MG 24 hr capsule Commonly known as: RITALIN LA Take 1 capsule (20 mg total) by mouth every morning. What changed: Another medication  with the same name was changed. Make sure you understand how and when to take each.   methylphenidate 20 MG tablet Commonly known as: RITALIN Take 1 tab in the afternoon daily. What changed: Another medication with the same name was changed. Make sure you understand how and when to take each.   methylphenidate 20 MG tablet Commonly known as: Ritalin Daily after lunch. What changed: Another medication with the same name was changed. Make sure you understand how and when to take each.   methylphenidate 20 MG 24 hr capsule Commonly known as: RITALIN LA Take 1 capsule (20 mg total) by mouth every morning. Take daily after lunch. What changed: additional instructions   methylphenidate 20 MG 24 hr capsule Commonly known as: RITALIN LA Take 1 capsule (20 mg total) by mouth every morning. What changed: Another medication with the same name was changed. Make sure you understand how and when to take each.   methylphenidate 20 MG tablet Commonly known as: RITALIN Take daily after lunch. What changed: Another medication with the same name was changed. Make sure you understand how and when to take each.   montelukast 10 MG tablet Commonly known as: SINGULAIR TAKE 1 TABLET BY MOUTH AT BEDTIME   multivitamin-iron-minerals-folic acid chewable tablet Chew 1 tablet by mouth daily.   pantoprazole 40 MG tablet Commonly known as: PROTONIX Take 1 tablet (40 mg total) by mouth 2 (two) times daily before a meal.   rOPINIRole 2 MG  tablet Commonly known as: REQUIP TAKE 1 TABLET BY MOUTH IN THE MORNING AND 2 AT BEDTIME   SUPER B COMPLEX PO Take 1 capsule by mouth daily.   tirzepatide 5 MG/0.5ML Pen Commonly known as: MOUNJARO Inject 5 mg into the skin once a week.   Toujeo SoloStar 300 UNIT/ML Solostar Pen Generic drug: insulin glargine (1 Unit Dial) Inject 64 Units into the skin in the morning and at bedtime.   triamcinolone 55 MCG/ACT Aero nasal inhaler Commonly known as: Nasacort Allergy 24HR Place 2 sprays into the nose daily.   triamterene-hydrochlorothiazide 37.5-25 MG tablet Commonly known as: MAXZIDE-25 Take 1 tablet by mouth daily.         OBJECTIVE:   Vital Signs: BP 120/70 (BP Location: Left Arm, Patient Position: Sitting, Cuff Size: Large)   Pulse 84   Ht '5\' 2"'$  (1.575 m)   Wt 221 lb (100.2 kg)   SpO2 98%   BMI 40.42 kg/m   Wt Readings from Last 3 Encounters:  04/02/22 221 lb (100.2 kg)  02/24/22 224 lb 12.8 oz (102 kg)  10/22/21 216 lb 2 oz (98 kg)     Exam: General: Pt appears well and is in NAD  Lungs: Clear with good BS bilat with no rales, rhonchi, or wheezes  Heart: RRR   Abdomen: soft, nontender  Extremities: 1+ pretibial edema.   Neuro: MS is good with appropriate affect, pt is alert and Ox3    DM foot exam: 02/24/2022  The skin of the feet is intact without sores or ulcerations. The pedal pulses are 2+ on right and 2+ on left. The sensation is intact to a screening 5.07, 10 gram monofilament bilaterally        DATA REVIEWED:  Lab Results  Component Value Date   HGBA1C 8.8 (A) 02/24/2022   HGBA1C 8.2 (A) 06/05/2021   HGBA1C 7.2 (A) 02/26/2021     Latest Reference Range & Units 06/05/21 16:03  TSH 0.35 - 5.50 uIU/mL 1.96  T4,Free(Direct) 0.60 - 1.60  ng/dL 1.17    Latest Reference Range & Units 10/22/21 15:34  Sodium 135 - 145 mEq/L 142  Potassium 3.5 - 5.1 mEq/L 4.1  Chloride 96 - 112 mEq/L 104  CO2 19 - 32 mEq/L 28  Glucose 70 - 99 mg/dL 114 (H)   BUN 6 - 23 mg/dL 22  Creatinine 0.40 - 1.20 mg/dL 1.04  Calcium 8.4 - 10.5 mg/dL 10.0  Alkaline Phosphatase 39 - 117 U/L 113  Albumin 3.5 - 5.2 g/dL 4.4  AST 0 - 37 U/L 36  ALT 0 - 35 U/L 31  Total Protein 6.0 - 8.3 g/dL 7.3  Total Bilirubin 0.2 - 1.2 mg/dL 0.4  GFR >60.00 mL/min 58.13 (L)    Latest Reference Range & Units 10/22/21 15:34  Total CHOL/HDL Ratio  3  Cholesterol 0 - 200 mg/dL 123  HDL Cholesterol >39.00 mg/dL 38.80 (L)  LDL (calc) 0 - 99 mg/dL 46  NonHDL  84.63  Triglycerides 0.0 - 149.0 mg/dL 191.0 (H)  VLDL 0.0 - 40.0 mg/dL 38.2      ASSESSMENT / PLAN / RECOMMENDATIONS:   1) Type 2 Diabetes Mellitus, Optimally controlled, With Neuropathic  complications - Most recent A1c of 8.8 %. Goal A1c < 7.0 %.     - Pt with hyperglycemia due to multiple barriers  -She had stopped Ozempic due to GI side effects, was unable to tolerate 0.25 mg dose -She is intolerant to Invokana due to recurrent yeast infections, she is scared of trying other alternatives of SGLT2 inhibitors. -She was trained on OmniPod but unfortunately it was cost prohibitive, tandem is cost prohibitive as well, she is looking for [place] provider but has not been successful - I started  her on Mounjaro 2.5 mg which she tolerated well but has not been able to obtain it from the pharmacy because the cost $550  and they have not been able to access the coupon system online  - I have offered to send it to one of pharmacies here at [facility], as I am not sure how else I can help  -She will remain on multiple daily injections - I have asked her to use the correction scale not only before each meal but also at bedtime , she has been noted with hyperglycemia post supper    MEDICATIONS:   Increase Mounjaro 5 mg weekly (samples) Continue metformin 500 mg XR 2 tabs twice daily Continue Toujeo 64 units twice daily Continue Humalog 20 units 3 times daily before every meal Continue correction factor: Humalog (BG  -140/10)   EDUCATION / INSTRUCTIONS: BG monitoring instructions: Patient is instructed to check her blood sugars 3 times a day, before meals. Call [place] [facility] if: BG persistently < 70  I reviewed the Rule of 15 for the treatment of hypoglycemia in detail with the patient. Literature supplied.    2) Diabetic complications:  Eye: Does not  have known diabetic retinopathy.  Neuro/ Feet: Does  have known diabetic peripheral neuropathy .  Renal: Patient does not have known baseline CKD. She   is  on an ACEI/ARB at present.    3) Hypothyroidism:  -Patient is clinically euthyroid -No change    Medication  Continue levothyroxine 125 mcg daily   F/U in 4 months    Signed electronically by: Mack Guise, MD  [facility] Endocrinology  [facility] [address] [place], [place] [address] Phone: [phone] FAX: [phone]   CC: Shelda Pal, [address] [place] [address] Phone: [phone]  Fax: [phone]  Return to Endocrinology clinic as below: No future appointments.

## 2022-04-02 ENCOUNTER — Ambulatory Visit (INDEPENDENT_AMBULATORY_CARE_PROVIDER_SITE_OTHER): Payer: 59 | Admitting: Internal Medicine

## 2022-04-02 ENCOUNTER — Encounter: Payer: Self-pay | Admitting: Internal Medicine

## 2022-04-02 ENCOUNTER — Other Ambulatory Visit (HOSPITAL_BASED_OUTPATIENT_CLINIC_OR_DEPARTMENT_OTHER): Payer: Self-pay

## 2022-04-02 VITALS — BP 120/70 | HR 84 | Ht 62.0 in | Wt 221.0 lb

## 2022-04-02 DIAGNOSIS — Z794 Long term (current) use of insulin: Secondary | ICD-10-CM | POA: Diagnosis not present

## 2022-04-02 DIAGNOSIS — E1165 Type 2 diabetes mellitus with hyperglycemia: Secondary | ICD-10-CM | POA: Diagnosis not present

## 2022-04-02 DIAGNOSIS — E039 Hypothyroidism, unspecified: Secondary | ICD-10-CM

## 2022-04-02 MED ORDER — HUMALOG KWIKPEN 200 UNIT/ML ~~LOC~~ SOPN: PEN_INJECTOR | SUBCUTANEOUS | 3 refills | Status: DC

## 2022-04-02 MED ORDER — TOUJEO SOLOSTAR 300 UNIT/ML ~~LOC~~ SOPN: 64.0000 [IU] | PEN_INJECTOR | Freq: Two times a day (BID) | SUBCUTANEOUS | 3 refills | Status: DC

## 2022-04-02 MED ORDER — LEVOTHYROXINE SODIUM 125 MCG PO TABS: 125.0000 ug | ORAL_TABLET | Freq: Every day | ORAL | 3 refills | Status: DC

## 2022-04-02 MED ORDER — INSULIN PEN NEEDLE 32G X 4 MM MISC: 1.0000 | Freq: Four times a day (QID) | 3 refills | Status: AC

## 2022-04-02 MED ORDER — TIRZEPATIDE 5 MG/0.5ML ~~LOC~~ SOAJ
5.0000 mg | SUBCUTANEOUS | 3 refills | Status: DC
  Filled 2022-04-02 – 2022-04-08 (×2): qty 6, 84d supply, fill #0
  Filled 2022-06-26: qty 2, 28d supply, fill #1
  Filled 2022-08-01: qty 2, 28d supply, fill #2

## 2022-04-02 MED ORDER — METFORMIN HCL ER 500 MG PO TB24: 1000.0000 mg | ORAL_TABLET | Freq: Two times a day (BID) | ORAL | 3 refills | Status: DC

## 2022-04-02 NOTE — Patient Instructions (Signed)
-   Increase Mounjaro 5 mg once weekly  - Continue Metformin 500 mg 2 tabs Twice a day  - Continue Toujeo 64  units twice a day  - Continue Humalog 20 units with each meal  - Humalog correctional insulin: ADD extra units on insulin to your meal-time Humalog  dose if your blood sugars are higher than 150. Use the scale below to help guide you:   Blood sugar before meal Number of units to inject  Less than 150 0 unit   151 -  160 1 units  161 -  170 2 units  171 -  180 3 units  181 -  190 4 units  191 -  200 5 units  201 -  210 6 units  211 -  220 7 units  221 -  230 8 units  231 - 240       HOW TO TREAT LOW BLOOD SUGARS (Blood sugar LESS THAN 70 MG/DL) Please follow the RULE OF 15 for the treatment of hypoglycemia treatment (when your (blood sugars are less than 70 mg/dL)   STEP 1: Take 15 grams of carbohydrates when your blood sugar is low, which includes:  3-4 GLUCOSE TABS  OR 3-4 OZ OF JUICE OR REGULAR SODA OR ONE TUBE OF GLUCOSE GEL    STEP 2: RECHECK blood sugar in 15 MINUTES STEP 3: If your blood sugar is still low at the 15 minute recheck --> then, go back to STEP 1 and treat AGAIN with another 15 grams of carbohydrates.

## 2022-04-03 ENCOUNTER — Other Ambulatory Visit (HOSPITAL_BASED_OUTPATIENT_CLINIC_OR_DEPARTMENT_OTHER): Payer: Self-pay

## 2022-04-07 ENCOUNTER — Other Ambulatory Visit (HOSPITAL_COMMUNITY): Payer: Self-pay

## 2022-04-07 ENCOUNTER — Other Ambulatory Visit: Payer: Self-pay | Admitting: Family Medicine

## 2022-04-07 ENCOUNTER — Telehealth: Payer: Self-pay

## 2022-04-07 DIAGNOSIS — G2581 Restless legs syndrome: Secondary | ICD-10-CM

## 2022-04-07 NOTE — Telephone Encounter (Signed)
Patient Advocate Encounter  Prior Authorization for Mounjaro 5MG /0.5ML pen-injectors has been approved through [employer].    [facility]   Effective: 04-07-2022 to 04-07-2023

## 2022-04-07 NOTE — Telephone Encounter (Signed)
Patient Advocate Encounter   Received notification from [facility] that prior authorization is required for [facility] 5MG /0.5ML pen-injectors  Submitted: 04-07-2022 [facility]  Status is pending

## 2022-04-08 ENCOUNTER — Other Ambulatory Visit (HOSPITAL_BASED_OUTPATIENT_CLINIC_OR_DEPARTMENT_OTHER): Payer: Self-pay

## 2022-04-27 ENCOUNTER — Other Ambulatory Visit: Payer: Self-pay | Admitting: Family Medicine

## 2022-04-28 ENCOUNTER — Other Ambulatory Visit: Payer: Self-pay | Admitting: Family Medicine

## 2022-04-28 DIAGNOSIS — K219 Gastro-esophageal reflux disease without esophagitis: Secondary | ICD-10-CM

## 2022-04-28 MED ORDER — PANTOPRAZOLE SODIUM 40 MG PO TBEC
40.0000 mg | DELAYED_RELEASE_TABLET | Freq: Two times a day (BID) | ORAL | 1 refills | Status: DC
Start: 2022-04-28 — End: 2022-09-30

## 2022-05-06 ENCOUNTER — Other Ambulatory Visit: Payer: Self-pay | Admitting: Family Medicine

## 2022-05-06 DIAGNOSIS — L299 Pruritus, unspecified: Secondary | ICD-10-CM

## 2022-05-07 ENCOUNTER — Other Ambulatory Visit: Payer: Self-pay | Admitting: Family Medicine

## 2022-05-07 DIAGNOSIS — L299 Pruritus, unspecified: Secondary | ICD-10-CM

## 2022-05-08 ENCOUNTER — Other Ambulatory Visit: Payer: Self-pay | Admitting: Family Medicine

## 2022-05-08 ENCOUNTER — Encounter: Payer: Self-pay | Admitting: Family Medicine

## 2022-05-08 DIAGNOSIS — E1165 Type 2 diabetes mellitus with hyperglycemia: Secondary | ICD-10-CM

## 2022-05-08 DIAGNOSIS — Z794 Long term (current) use of insulin: Secondary | ICD-10-CM

## 2022-05-08 DIAGNOSIS — G2581 Restless legs syndrome: Secondary | ICD-10-CM

## 2022-05-08 DIAGNOSIS — I10 Essential (primary) hypertension: Secondary | ICD-10-CM

## 2022-05-09 MED ORDER — GABAPENTIN 600 MG PO TABS
600.0000 mg | ORAL_TABLET | Freq: Three times a day (TID) | ORAL | 0 refills | Status: DC
Start: 2022-05-09 — End: 2022-06-04

## 2022-05-09 MED ORDER — LOSARTAN POTASSIUM 50 MG PO TABS
50.0000 mg | ORAL_TABLET | Freq: Every day | ORAL | 0 refills | Status: DC
Start: 2022-05-09 — End: 2022-06-04

## 2022-05-09 MED ORDER — ROPINIROLE HCL 2 MG PO TABS
ORAL_TABLET | ORAL | 0 refills | Status: DC
Start: 2022-05-09 — End: 2022-05-16

## 2022-05-15 ENCOUNTER — Other Ambulatory Visit: Payer: Self-pay | Admitting: Family Medicine

## 2022-05-16 ENCOUNTER — Other Ambulatory Visit: Payer: Self-pay | Admitting: Family Medicine

## 2022-05-16 DIAGNOSIS — G2581 Restless legs syndrome: Secondary | ICD-10-CM

## 2022-05-16 MED ORDER — ROPINIROLE HCL 2 MG PO TABS: ORAL_TABLET | ORAL | 2 refills | Status: DC

## 2022-05-17 ENCOUNTER — Encounter: Payer: Self-pay | Admitting: Internal Medicine

## 2022-05-18 ENCOUNTER — Other Ambulatory Visit: Payer: Self-pay | Admitting: Family Medicine

## 2022-05-19 MED ORDER — TOUJEO SOLOSTAR 300 UNIT/ML ~~LOC~~ SOPN: 52.0000 [IU] | PEN_INJECTOR | Freq: Two times a day (BID) | SUBCUTANEOUS | 3 refills | Status: DC

## 2022-05-27 ENCOUNTER — Telehealth: Payer: Self-pay

## 2022-05-27 ENCOUNTER — Other Ambulatory Visit (HOSPITAL_COMMUNITY): Payer: Self-pay

## 2022-05-27 NOTE — Telephone Encounter (Signed)
[facility] advised and going through for 30 day supply

## 2022-05-27 NOTE — Telephone Encounter (Signed)
Per [facility] pharmacy patient has new insurance and needs  PA for the Dexcom G7.

## 2022-05-27 NOTE — Telephone Encounter (Signed)
Pharmacy Patient Advocate Encounter  Prior Authorization has been APPROVED  Effective dates: 05/27/22 through 11/23/22

## 2022-05-27 NOTE — Telephone Encounter (Signed)
Patient Advocate Encounter   Received notification from pt msgs that pt has new insurance and a prior authorization is required for Dexcom G7 sensor  Submitted: 05/27/22 Key [id number] Status is pending

## 2022-06-04 ENCOUNTER — Encounter: Payer: Self-pay | Admitting: Family Medicine

## 2022-06-04 ENCOUNTER — Ambulatory Visit (INDEPENDENT_AMBULATORY_CARE_PROVIDER_SITE_OTHER): Payer: 59 | Admitting: Family Medicine

## 2022-06-04 ENCOUNTER — Telehealth: Payer: Self-pay

## 2022-06-04 ENCOUNTER — Telehealth: Payer: Self-pay | Admitting: Family Medicine

## 2022-06-04 VITALS — BP 122/70 | HR 96 | Temp 98.4°F | Ht 62.0 in | Wt 213.1 lb

## 2022-06-04 DIAGNOSIS — Z794 Long term (current) use of insulin: Secondary | ICD-10-CM

## 2022-06-04 DIAGNOSIS — I10 Essential (primary) hypertension: Secondary | ICD-10-CM | POA: Diagnosis not present

## 2022-06-04 DIAGNOSIS — E1165 Type 2 diabetes mellitus with hyperglycemia: Secondary | ICD-10-CM

## 2022-06-04 DIAGNOSIS — G2581 Restless legs syndrome: Secondary | ICD-10-CM | POA: Diagnosis not present

## 2022-06-04 DIAGNOSIS — F988 Other specified behavioral and emotional disorders with onset usually occurring in childhood and adolescence: Secondary | ICD-10-CM

## 2022-06-04 DIAGNOSIS — Z79899 Other long term (current) drug therapy: Secondary | ICD-10-CM

## 2022-06-04 DIAGNOSIS — Z1231 Encounter for screening mammogram for malignant neoplasm of breast: Secondary | ICD-10-CM

## 2022-06-04 MED ORDER — GABAPENTIN 600 MG PO TABS
600.0000 mg | ORAL_TABLET | Freq: Three times a day (TID) | ORAL | 3 refills | Status: DC
Start: 2022-06-04 — End: 2022-12-31

## 2022-06-04 MED ORDER — METHYLPHENIDATE HCL ER (LA) 20 MG PO CP24: 20.0000 mg | ORAL_CAPSULE | ORAL | 0 refills | Status: DC

## 2022-06-04 MED ORDER — METHYLPHENIDATE HCL 20 MG PO TABS: ORAL_TABLET | ORAL | 0 refills | Status: DC

## 2022-06-04 MED ORDER — LOSARTAN POTASSIUM 50 MG PO TABS: 50.0000 mg | ORAL_TABLET | Freq: Every day | ORAL | 3 refills | Status: DC

## 2022-06-04 NOTE — Patient Instructions (Addendum)
Keep the diet clean and stay active.  Aim to do some physical exertion for 150 minutes per week. This is typically divided into 5 days per week, 30 minutes per day. The activity should be enough to get your heart rate up. Anything is better than nothing if you have time constraints.  Let us know if you need a sooner physical.   Let us know if you need anything.

## 2022-06-04 NOTE — Telephone Encounter (Signed)
PA initiated via Covermymeds; KEY: [id number]  PA Case: [id number], Status: Approved, Coverage Starts on: 06/04/2022 12:00:00 AM, Coverage Ends on: 06/04/2023 12:00:00 AM. Authorization Expiration Date: 06/03/2023

## 2022-06-04 NOTE — Telephone Encounter (Signed)
Pharmacy called stating the patient is going to need a PA for the Ritalin. Please advise.

## 2022-06-04 NOTE — Progress Notes (Signed)
Chief Complaint  Patient presents with   Follow-up    6 month    Subjective Cristina Burns is a 62 y.o. female who presents for hypertension follow up. She does not monitor home blood pressures. She is compliant with medications- losartan 50 mg/d, Maxzide 37.5-25 mg/d. Patient has these side effects of medication: none She is sometimes adhering to a healthy diet overall. Current exercise: stretching No CP or SOB.   ADHD Taking Ritalin LA 20 mg in AM, 20 mg in the afternoon. Compliant, no AE's. Working well.   ROS Taking gabapentin 600 mg 3 times daily for this neuropathy in addition to Requip 2 mg in the morning and 4 mg in the evening.  She reports compliance and no adverse effects.  It is controlling her symptoms.   Past Medical History:  Diagnosis Date   ADHD (attention deficit hyperactivity disorder)    Allergic rhinitis    Allergy    Diabetes mellitus without complication (HCC)    Diabetic neuropathy (HCC)    GERD (gastroesophageal reflux disease)    Hypertension    Hypothyroidism    RLS (restless legs syndrome)    Stomach ulcer "it's been a long time"    Exam BP 122/70 (BP Location: Left Arm, Patient Position: Sitting, Cuff Size: Normal)   Pulse 96   Temp 98.4 F (36.9 C) (Oral)   Ht 5\' 2"  (1.575 m)   Wt 213 lb 2 oz (96.7 kg)   SpO2 95%   BMI 38.98 kg/m  General:  well developed, well nourished, in no apparent distress Heart: RRR, no bruits, 1+ pitting bilateral LE edema tapering at the knees Lungs: clear to auscultation, no accessory muscle use Psych: well oriented with normal range of affect and appropriate judgment/insight  Attention deficit disorder, unspecified hyperactivity presence - Plan: methylphenidate (RITALIN) 20 MG tablet, methylphenidate (RITALIN LA) 20 MG 24 hr capsule, methylphenidate (RITALIN LA) 20 MG 24 hr capsule, methylphenidate (RITALIN) 20 MG tablet, methylphenidate (RITALIN) 20 MG tablet, methylphenidate (RITALIN LA) 20 MG 24 hr  capsule  Essential hypertension - Plan: losartan (COZAAR) 50 MG tablet  RLS (restless legs syndrome)  Encounter for screening mammogram for malignant neoplasm of breast - Plan: MM DIGITAL SCREENING BILATERAL  Type 2 diabetes mellitus with hyperglycemia, with long-term current use of insulin (HCC) - Plan: gabapentin (NEURONTIN) 600 MG tablet, Microalbumin / creatinine urine ratio  Encounter for long-term (current) use of high-risk medication - Plan: Drug Monitoring Panel [id number] , Urine  Chronic, stable.  Continue methylphenidate LA 20 mg daily in the morning and 20 mg short acting in the afternoon.  CSA and UDS updated today.   Chronic, stable.  Continue losartan 50 mg daily, Maxide HCT 37.5-25 mg daily.  Counseled on diet and exercise. Chronic, stable.  Continue gabapentin 600 mg 3 times daily, Requip 2 mg in the morning and 4 mg in the evening. F/u in 1 yr for CPE/OV which she is requesting. The patient voiced understanding and agreement to the plan.  Jilda Roche Grant, DO 06/04/22  4:16 PM

## 2022-06-04 NOTE — Addendum Note (Signed)
Addended by: Rosita Kea on: 06/04/2022 04:34 PM   Modules accepted: Orders

## 2022-06-06 LAB — DRUG MONITORING PANEL 376104, URINE
Amphetamines: NEGATIVE ng/mL (ref <500)
Barbiturates: NEGATIVE ng/mL (ref <300)
Benzodiazepines: NEGATIVE ng/mL (ref <100)
Cocaine Metabolite: NEGATIVE ng/mL (ref <150)
Desmethyltramadol: NEGATIVE ng/mL (ref <100)
Opiates: NEGATIVE ng/mL (ref <100)
Oxycodone: NEGATIVE ng/mL (ref <100)
Tramadol: NEGATIVE ng/mL (ref <100)

## 2022-06-06 LAB — DM TEMPLATE

## 2022-06-09 NOTE — Telephone Encounter (Signed)
Have not seen a PA yet Called the patient no answer and mailbox full.

## 2022-06-26 ENCOUNTER — Other Ambulatory Visit (HOSPITAL_BASED_OUTPATIENT_CLINIC_OR_DEPARTMENT_OTHER): Payer: Self-pay

## 2022-06-27 ENCOUNTER — Other Ambulatory Visit (HOSPITAL_COMMUNITY): Payer: Self-pay

## 2022-06-27 ENCOUNTER — Other Ambulatory Visit: Payer: Self-pay | Admitting: Family Medicine

## 2022-06-27 DIAGNOSIS — R109 Unspecified abdominal pain: Secondary | ICD-10-CM

## 2022-06-28 ENCOUNTER — Encounter: Payer: Self-pay | Admitting: Internal Medicine

## 2022-06-30 ENCOUNTER — Encounter: Payer: Self-pay | Admitting: Internal Medicine

## 2022-06-30 ENCOUNTER — Other Ambulatory Visit (HOSPITAL_BASED_OUTPATIENT_CLINIC_OR_DEPARTMENT_OTHER): Payer: Self-pay

## 2022-06-30 ENCOUNTER — Other Ambulatory Visit: Payer: Self-pay | Admitting: Internal Medicine

## 2022-06-30 MED ORDER — TOUJEO SOLOSTAR 300 UNIT/ML ~~LOC~~ SOPN: 50.0000 [IU] | PEN_INJECTOR | Freq: Two times a day (BID) | SUBCUTANEOUS | 3 refills | Status: DC

## 2022-07-01 ENCOUNTER — Other Ambulatory Visit (HOSPITAL_BASED_OUTPATIENT_CLINIC_OR_DEPARTMENT_OTHER): Payer: Self-pay

## 2022-07-03 ENCOUNTER — Other Ambulatory Visit (HOSPITAL_BASED_OUTPATIENT_CLINIC_OR_DEPARTMENT_OTHER): Payer: Self-pay

## 2022-07-04 ENCOUNTER — Other Ambulatory Visit (HOSPITAL_BASED_OUTPATIENT_CLINIC_OR_DEPARTMENT_OTHER): Payer: Self-pay

## 2022-07-07 ENCOUNTER — Other Ambulatory Visit (HOSPITAL_BASED_OUTPATIENT_CLINIC_OR_DEPARTMENT_OTHER): Payer: Self-pay

## 2022-07-21 ENCOUNTER — Other Ambulatory Visit: Payer: Self-pay | Admitting: Family Medicine

## 2022-08-01 ENCOUNTER — Encounter: Payer: Self-pay | Admitting: Internal Medicine

## 2022-08-01 ENCOUNTER — Other Ambulatory Visit: Payer: Self-pay | Admitting: Internal Medicine

## 2022-08-01 DIAGNOSIS — E1165 Type 2 diabetes mellitus with hyperglycemia: Secondary | ICD-10-CM

## 2022-08-01 MED ORDER — INSULIN LISPRO (1 UNIT DIAL) 100 UNIT/ML (KWIKPEN): PEN_INJECTOR | SUBCUTANEOUS | 2 refills | Status: DC

## 2022-08-04 ENCOUNTER — Other Ambulatory Visit (HOSPITAL_BASED_OUTPATIENT_CLINIC_OR_DEPARTMENT_OTHER): Payer: Self-pay

## 2022-08-05 ENCOUNTER — Other Ambulatory Visit (HOSPITAL_BASED_OUTPATIENT_CLINIC_OR_DEPARTMENT_OTHER): Payer: Self-pay

## 2022-08-22 ENCOUNTER — Encounter: Payer: Self-pay | Admitting: Internal Medicine

## 2022-08-23 ENCOUNTER — Other Ambulatory Visit: Payer: Self-pay | Admitting: Family Medicine

## 2022-08-24 ENCOUNTER — Other Ambulatory Visit: Payer: Self-pay | Admitting: Family Medicine

## 2022-08-24 DIAGNOSIS — L299 Pruritus, unspecified: Secondary | ICD-10-CM

## 2022-08-25 ENCOUNTER — Telehealth: Payer: Self-pay

## 2022-08-25 MED ORDER — TIRZEPATIDE 2.5 MG/0.5ML ~~LOC~~ SOAJ: 2.5000 mg | SUBCUTANEOUS | 3 refills | Status: DC

## 2022-08-25 NOTE — Telephone Encounter (Signed)
Hi. I'm experiencing low blood glucose levels again. This is affecting my sleep and is becoming quite scary at times. Also, I don't know whether it's related but I'm having some moderate stomach pain, as well.  Patient taking 16 units with each meal of fasting acting, 50 units bid of long acting, Metformin 2 tabs bid, and Mounjaro weekly. Report placed on your desk.

## 2022-08-25 NOTE — Telephone Encounter (Signed)
Patient has been advised on medication changes. She wants to know if the [facility] can be reduce to the lower strength because of the stomach pains she is having.

## 2022-08-26 ENCOUNTER — Encounter: Payer: Self-pay | Admitting: Internal Medicine

## 2022-08-29 ENCOUNTER — Encounter: Payer: Self-pay | Admitting: Internal Medicine

## 2022-08-29 ENCOUNTER — Ambulatory Visit (INDEPENDENT_AMBULATORY_CARE_PROVIDER_SITE_OTHER): Payer: PRIVATE HEALTH INSURANCE | Admitting: Internal Medicine

## 2022-08-29 VITALS — BP 120/78 | HR 88 | Ht 62.0 in | Wt 205.0 lb

## 2022-08-29 DIAGNOSIS — E039 Hypothyroidism, unspecified: Secondary | ICD-10-CM

## 2022-08-29 DIAGNOSIS — E1142 Type 2 diabetes mellitus with diabetic polyneuropathy: Secondary | ICD-10-CM | POA: Diagnosis not present

## 2022-08-29 DIAGNOSIS — Z794 Long term (current) use of insulin: Secondary | ICD-10-CM

## 2022-08-29 DIAGNOSIS — E1165 Type 2 diabetes mellitus with hyperglycemia: Secondary | ICD-10-CM | POA: Diagnosis not present

## 2022-08-29 DIAGNOSIS — N179 Acute kidney failure, unspecified: Secondary | ICD-10-CM

## 2022-08-29 LAB — POCT GLYCOSYLATED HEMOGLOBIN (HGB A1C): Hemoglobin A1C: 6.7 % — AB (ref 4.0–5.6)

## 2022-08-29 LAB — BASIC METABOLIC PANEL
BUN: 39 mg/dL — ABNORMAL HIGH (ref 6–23)
CO2: 28 mEq/L (ref 19–32)
Calcium: 10.5 mg/dL (ref 8.4–10.5)
Chloride: 101 mEq/L (ref 96–112)
Creatinine, Ser: 1.49 mg/dL — ABNORMAL HIGH (ref 0.40–1.20)
GFR: 37.53 mL/min — ABNORMAL LOW (ref >60.00)
Glucose, Bld: 121 mg/dL — ABNORMAL HIGH (ref 70–99)
Potassium: 3.9 mEq/L (ref 3.5–5.1)
Sodium: 142 mEq/L (ref 135–145)

## 2022-08-29 LAB — TSH: TSH: 2.87 u[IU]/mL (ref 0.35–5.50)

## 2022-08-29 MED ORDER — TIRZEPATIDE 2.5 MG/0.5ML ~~LOC~~ SOAJ: 2.5000 mg | SUBCUTANEOUS | 3 refills | Status: DC

## 2022-08-29 NOTE — Progress Notes (Unsigned)
Name: Cristina Burns  Age/ Sex: 62 y.o., female   MRN/ DOB: [id number], September 17, 1960     PCP: Sharlene Dory, DO   Reason for Endocrinology Evaluation: Type 2 Diabetes Mellitus  Initial Endocrine Consultative Visit: 05/08/2020    PATIENT IDENTIFIER: Cristina Burns is a 62 y.o. female with a past medical history of T2DM, RLS, Hypothyroidism and HTN. The patient has followed with Endocrinology clinic since 05/08/2020 for consultative assistance with management of her diabetes.  DIABETIC HISTORY:  Cristina Burns was diagnosed with DM in 2000, Trulicity - stomach issues. Invokana- recurrent yeast infections. Her hemoglobin A1c has ranged from  7.2%in 2021, peaking at 10.6% in 2018.   On her initial visit to our clinic her A1c 7.8%, she was on Glimepiride, MDI regimen and Ozempic. We stopped Glimepiride, continued Metfomrin,MDI regimen and decreased Ozempic due to nausea    Invokana caused  yeast infection   She stopped Ozempic it by her visit in 09/2020 She was trained on OmniPod use 09/2021 but was unable to sustain it due to cost issues  Started [facility] 02/2022  SUBJECTIVE:   During the last visit (04/02/2022): A1c 8.8 %     Today (08/29/2022): Cristina Burns  is here for a follow up on diabetes management. She checks her blood sugars 3 times daily . The patient has  had hypoglycemic episodes since the last clinic visit.  She has lost weight  She was having abdominal pain with Mounjaro 5 mg, and diarrhea but no nausea or vomiting    HOME DIABETES REGIMEN:  Metformin 500 mg XR 2 tabs twice daily Mounjaro 5 mg weekly  Toujeo 50 units BID  Humalog 12 units TIDQAC CF: Humalog (BG-140/10)     Statin: yes ACE-I/ARB: yes Prior Diabetic Education: yes    CONTINUOUS GLUCOSE MONITORING RECORD INTERPRETATION    Dates of Recording: 7/27-08/29/2022  Sensor description: dexcom  Results statistics:   CGM use % of time 100  Average and SD 132/39  Time in range   87  %  % Time  Above 180 10  % Time above 250 1  % Time Below target 2   Glycemic patterns summary: BG's optimal at night and fluctuate during the day  Hyperglycemic episodes  postprandial   Hypoglycemic episodes occurred during the day and night  Overnight periods: Variable   DIABETIC COMPLICATIONS: Microvascular complications:  Neuropathy Denies: CKD, retinopathy Last Eye Exam: Completed 2023  Macrovascular complications:   Denies: CAD, CVA, PVD   HISTORY:  Past Medical History:  Past Medical History:  Diagnosis Date   ADHD (attention deficit hyperactivity disorder)    Allergic rhinitis    Allergy    Diabetes mellitus without complication (HCC)    Diabetic neuropathy (HCC)    GERD (gastroesophageal reflux disease)    Hypertension    Hypothyroidism    RLS (restless legs syndrome)    Stomach ulcer "it's been a long time"   Past Surgical History:  Past Surgical History:  Procedure Laterality Date   ABDOMINAL HYSTERECTOMY  2002   ACHILLES TENDON REPAIR  2009   CHOLECYSTECTOMY  1982   ROTATOR CUFF REPAIR Right 2011   Social History:  reports that she quit smoking about 40 years ago. Her smoking use included cigarettes. She started smoking about 49 years ago. She has a 18 pack-year smoking history. She has never used smokeless tobacco. She reports that she does not drink alcohol and does not use drugs. Family History:  Family History  Problem  Relation Age of Onset   Heart disease Father    Heart attack Father    Heart disease Brother        CABG   Diabetes Brother    Kidney disease Brother    Cirrhosis Brother    Hypertension Brother    Heart attack Brother    Heart disease Brother    Diabetes Brother    Heart attack Brother    Diabetes Mother    Breast cancer Mother    Stroke Mother      HOME MEDICATIONS: Allergies as of 08/29/2022       Reactions   Codeine Hives   Darvon [propoxyphene] Hives   "Darvocet"   Doxycycline    Double Vision   Erythromycin     Stomach pain   Keflex [cephalexin] Hives        Medication List        Accurate as of August 29, 2022  1:25 PM. If you have any questions, ask your nurse or doctor.          albuterol 108 (90 Base) MCG/ACT inhaler Commonly known as: VENTOLIN HFA Inhale 1-2 puffs into the lungs every 6 (six) hours as needed for wheezing or shortness of breath.   amLODipine 5 MG tablet Commonly known as: NORVASC Take 1 tablet (5 mg total) by mouth daily.   atorvastatin 40 MG tablet Commonly known as: LIPITOR Take 1 tablet by mouth once daily   Azelastine HCl 137 MCG/SPRAY Soln Place 1 spray into the nose in the morning and at bedtime.   celecoxib 200 MG capsule Commonly known as: CELEBREX Take 200 mg by mouth daily.   cholecalciferol 25 MCG (1000 UNIT) tablet Commonly known as: VITAMIN D3 Take 1 tablet (1,000 Units total) by mouth daily.   Dexcom G7 Sensor Misc 1 Device by Does not apply route as directed.   dicyclomine 10 MG capsule Commonly known as: BENTYL TAKE 1 CAPSULE BY MOUTH EVERY 6 HOURS AS NEEDED FOR  ABDOMINAL  CRAMPING   dicyclomine 10 MG capsule Commonly known as: BENTYL TAKE 1 CAPSULE BY MOUTH EVERY 6 HOURS AS NEEDED FOR  ABDOMINAL  CRAMPING   furosemide 40 MG tablet Commonly known as: LASIX Take 1 tablet by mouth twice daily   gabapentin 600 MG tablet Commonly known as: NEURONTIN Take 1 tablet (600 mg total) by mouth 3 (three) times daily.   glucose blood test strip Use twice daily to check blood sugar.  DX E11.9   HumaLOG KwikPen 200 UNIT/ML KwikPen Generic drug: insulin lispro Max daily 80 units   insulin lispro 100 UNIT/ML KwikPen Commonly known as: HumaLOG KwikPen 80 units max daily dose.   Insulin Pen Needle 32G X 4 MM Misc 1 Device by Does not apply route in the morning, at noon, in the evening, and at bedtime.   levocetirizine 5 MG tablet Commonly known as: XYZAL TAKE 1 TABLET BY MOUTH ONCE DAILY IN THE EVENING   levothyroxine 125 MCG  tablet Commonly known as: SYNTHROID Take 1 tablet (125 mcg total) by mouth daily.   losartan 50 MG tablet Commonly known as: COZAAR Take 1 tablet (50 mg total) by mouth daily.   metFORMIN 500 MG 24 hr tablet Commonly known as: GLUCOPHAGE-XR Take 2 tablets (1,000 mg total) by mouth 2 (two) times daily.   methylphenidate 20 MG 24 hr capsule Commonly known as: RITALIN LA Take 1 capsule (20 mg total) by mouth every morning.   methylphenidate 20 MG tablet Commonly known  as: RITALIN Take daily after lunch.   methylphenidate 20 MG tablet Commonly known as: Ritalin Daily after lunch.   methylphenidate 20 MG 24 hr capsule Commonly known as: RITALIN LA Take 1 capsule (20 mg total) by mouth every morning.   methylphenidate 20 MG tablet Commonly known as: RITALIN Take 1 tab in the afternoon daily.   methylphenidate 20 MG 24 hr capsule Commonly known as: RITALIN LA Take 1 capsule (20 mg total) by mouth every morning.   montelukast 10 MG tablet Commonly known as: SINGULAIR TAKE 1 TABLET BY MOUTH AT BEDTIME   multivitamin-iron-minerals-folic acid chewable tablet Chew 1 tablet by mouth daily.   pantoprazole 40 MG tablet Commonly known as: PROTONIX Take 1 tablet (40 mg total) by mouth 2 (two) times daily before a meal.   rOPINIRole 2 MG tablet Commonly known as: REQUIP TAKE 1 TABLET MY MOUTH IN THE MORNING AND 2 AT BEDTIME.   SUPER B COMPLEX PO Take 1 capsule by mouth daily.   tirzepatide 2.5 MG/0.5ML Pen Commonly known as: MOUNJARO Inject 2.5 mg into the skin once a week.   Toujeo SoloStar 300 UNIT/ML Solostar Pen Generic drug: insulin glargine (1 Unit Dial) Inject 50 Units into the skin in the morning and at bedtime.   triamcinolone 55 MCG/ACT Aero nasal inhaler Commonly known as: Nasacort Allergy 24HR Place 2 sprays into the nose daily.   triamterene-hydrochlorothiazide 37.5-25 MG tablet Commonly known as: MAXZIDE-25 Take 1 tablet by mouth once daily          OBJECTIVE:   Vital Signs: BP 120/78 (BP Location: Left Arm, Patient Position: Sitting, Cuff Size: Large)   Pulse 88   Ht 5\' 2"  (1.575 m)   Wt 205 lb (93 kg)   SpO2 99%   BMI 37.49 kg/m   Wt Readings from Last 3 Encounters:  08/29/22 205 lb (93 kg)  06/04/22 213 lb 2 oz (96.7 kg)  04/02/22 221 lb (100.2 kg)     Exam: General: Cristina Burns appears well and is in NAD  Lungs: Clear with good BS bilat with no rales, rhonchi, or wheezes  Heart: RRR   Abdomen: soft, nontender  Extremities: 1+ pretibial edema.   Neuro: MS is good with appropriate affect, Cristina Burns is alert and Ox3    DM foot exam: 02/24/2022  The skin of the feet is intact without sores or ulcerations. The pedal pulses are 2+ on right and 2+ on left. The sensation is intact to a screening 5.07, 10 gram monofilament bilaterally        DATA REVIEWED:  Lab Results  Component Value Date   HGBA1C 6.7 (A) 08/29/2022   HGBA1C 8.8 (A) 02/24/2022   HGBA1C 8.2 (A) 06/05/2021     Latest Reference Range & Units 06/05/21 16:03  TSH 0.35 - 5.50 uIU/mL 1.96  T4,Free(Direct) 0.60 - 1.60 ng/dL 4.40    Latest Reference Range & Units 10/22/21 15:34  Sodium 135 - 145 mEq/L 142  Potassium 3.5 - 5.1 mEq/L 4.1  Chloride 96 - 112 mEq/L 104  CO2 19 - 32 mEq/L 28  Glucose 70 - 99 mg/dL 347 (H)  BUN 6 - 23 mg/dL 22  Creatinine 4.25 - 9.56 mg/dL 3.87  Calcium 8.4 - 56.4 mg/dL 33.2  Alkaline Phosphatase 39 - 117 U/L 113  Albumin 3.5 - 5.2 g/dL 4.4  AST 0 - 37 U/L 36  ALT 0 - 35 U/L 31  Total Protein 6.0 - 8.3 g/dL 7.3  Total Bilirubin 0.2 - 1.2 mg/dL  0.4  GFR >60.00 mL/min 58.13 (L)    Latest Reference Range & Units 10/22/21 15:34  Total CHOL/HDL Ratio  3  Cholesterol 0 - 200 mg/dL 478  HDL Cholesterol >29.56 mg/dL 21.30 (L)  LDL (calc) 0 - 99 mg/dL 46  NonHDL  86.57  Triglycerides 0.0 - 149.0 mg/dL 846.9 (H)  VLDL 0.0 - 62.9 mg/dL 52.8      ASSESSMENT / PLAN / RECOMMENDATIONS:   1) Type 2 Diabetes Mellitus, Optimally  controlled, With Neuropathic  complications - Most recent A1c of 6.7 %. Goal A1c < 7.0 %.     - Cristina Burns with hyperglycemia due to multiple barriers  -She had stopped Ozempic due to GI side effects, was unable to tolerate 0.25 mg dose -She is intolerant to Invokana due to recurrent yeast infections, she is scared of trying other alternatives of SGLT2 inhibitors. -She was trained on OmniPod but unfortunately it was cost prohibitive, tandem is cost prohibitive as well, she is looking for Tier1 provider but has not been successful -Intolerant to [employer] 5 mg weekly dose  MEDICATIONS:    Mounjaro 2.5 mg weekly (samples) Continue metformin 500 mg XR 2 tabs twice daily Continue Toujeo 64 units twice daily Continue Humalog 20 units 3 times daily before every meal Continue correction factor: Humalog (BG -140/10)   EDUCATION / INSTRUCTIONS: BG monitoring instructions: Patient is instructed to check her blood sugars 3 times a day, before meals. Call [facility] if: BG persistently < 70  I reviewed the Rule of 15 for the treatment of hypoglycemia in detail with the patient. Literature supplied.    2) Diabetic complications:  Eye: Does not  have known diabetic retinopathy.  Neuro/ Feet: Does  have known diabetic peripheral neuropathy .  Renal: Patient does not have known baseline CKD. She   is  on an ACEI/ARB at present.    3) Hypothyroidism:  -Patient is clinically euthyroid -No change    Medication  Continue levothyroxine 125 mcg daily   F/U in 4 months    Signed electronically by: Lyndle Herrlich, MD  [facility]  [facility] [address] [place] [place], [place] [address] Phone: [phone] FAX: [phone]   CC: Sharlene Dory, DO [address] [place] [address] Phone: [phone]  Fax: [phone]  Return to Endocrinology clinic as below: No future appointments.

## 2022-08-29 NOTE — Patient Instructions (Addendum)
-   Continue Mounjaro 2.5 mg once weekly  - Continue Metformin 500 mg 2 tabs Twice a day  - Decrease  Toujeo 45  units twice a day  - Continue Humalog 10 units with each meal  - Humalog correctional insulin: ADD extra units on insulin to your meal-time Humalog  dose if your blood sugars are higher than 150. Use the scale below to help guide you:   Blood sugar before meal Number of units to inject  Less than 150 0 unit  151- 170 1 units  171 - 190 2 units  191 - 210 3 units  211 - 230 4 units  231 - 250 5 units  251 - 270 6 units      HOW TO TREAT LOW BLOOD SUGARS (Blood sugar LESS THAN 70 MG/DL) Please follow the RULE OF 15 for the treatment of hypoglycemia treatment (when your (blood sugars are less than 70 mg/dL)   STEP 1: Take 15 grams of carbohydrates when your blood sugar is low, which includes:  3-4 GLUCOSE TABS  OR 3-4 OZ OF JUICE OR REGULAR SODA OR ONE TUBE OF GLUCOSE GEL    STEP 2: RECHECK blood sugar in 15 MINUTES STEP 3: If your blood sugar is still low at the 15 minute recheck --> then, go back to STEP 1 and treat AGAIN with another 15 grams of carbohydrates.

## 2022-09-01 ENCOUNTER — Telehealth: Payer: Self-pay | Admitting: Internal Medicine

## 2022-09-01 MED ORDER — METFORMIN HCL ER 500 MG PO TB24
500.0000 mg | ORAL_TABLET | Freq: Two times a day (BID) | ORAL | 3 refills | Status: DC
Start: 2022-09-01 — End: 2023-03-04

## 2022-09-01 NOTE — Telephone Encounter (Signed)
Patient advised and will follow recommendations

## 2022-09-01 NOTE — Telephone Encounter (Signed)
Please let the patient know that her blood work shows that her kidneys are extremely dehydrated.  She needs to increase her water intake by at least double immediately    Please asked the patient to decrease the metformin to 1 tablet twice a day (instead of 2 tablets twice a day)   Thyroid function is normal, continue levothyroxine 125 mcg daily   Thanks

## 2022-09-02 ENCOUNTER — Other Ambulatory Visit (HOSPITAL_COMMUNITY): Payer: Self-pay

## 2022-09-02 ENCOUNTER — Telehealth: Payer: Self-pay

## 2022-09-02 NOTE — Telephone Encounter (Signed)
Pharmacy Patient Advocate Encounter   Received notification from CoverMyMeds that prior authorization for [facility] is required/requested.   Insurance verification completed.   The patient is insured through  [employer]  .   Per test claim: PA required; PA submitted to [employer] via CoverMyMeds Key/confirmation #/EOC [id number] Status is pending

## 2022-09-04 ENCOUNTER — Other Ambulatory Visit (HOSPITAL_COMMUNITY): Payer: Self-pay

## 2022-09-04 NOTE — Telephone Encounter (Signed)
[facility] calling to check status the PA.

## 2022-09-06 ENCOUNTER — Other Ambulatory Visit: Payer: Self-pay | Admitting: Family Medicine

## 2022-09-11 NOTE — Telephone Encounter (Signed)
Called insurance today for a follow up. Insurance states the status is still pending. They have submitted a message to the review board to try to have a determination by end of day tomorrow

## 2022-09-12 NOTE — Telephone Encounter (Signed)
Pharmacy Patient Advocate Encounter  Received notification from  [facility]  that Prior Authorization for Cristina Burns has been APPROVED until 09/11/23

## 2022-09-29 ENCOUNTER — Other Ambulatory Visit: Payer: Self-pay | Admitting: Family Medicine

## 2022-09-29 DIAGNOSIS — K219 Gastro-esophageal reflux disease without esophagitis: Secondary | ICD-10-CM

## 2022-10-02 ENCOUNTER — Other Ambulatory Visit: Payer: Self-pay | Admitting: Family Medicine

## 2022-10-09 ENCOUNTER — Other Ambulatory Visit: Payer: Self-pay | Admitting: Family Medicine

## 2022-10-09 DIAGNOSIS — R109 Unspecified abdominal pain: Secondary | ICD-10-CM

## 2022-10-13 ENCOUNTER — Ambulatory Visit: Payer: 59 | Admitting: Internal Medicine

## 2022-10-20 ENCOUNTER — Other Ambulatory Visit: Payer: Self-pay | Admitting: Family Medicine

## 2022-11-02 ENCOUNTER — Other Ambulatory Visit: Payer: Self-pay | Admitting: Family Medicine

## 2022-11-22 ENCOUNTER — Other Ambulatory Visit: Payer: Self-pay | Admitting: Family Medicine

## 2022-11-22 DIAGNOSIS — L299 Pruritus, unspecified: Secondary | ICD-10-CM

## 2022-11-25 ENCOUNTER — Other Ambulatory Visit (HOSPITAL_COMMUNITY): Payer: Self-pay

## 2022-11-27 ENCOUNTER — Other Ambulatory Visit: Payer: Self-pay | Admitting: Family Medicine

## 2022-11-27 DIAGNOSIS — L299 Pruritus, unspecified: Secondary | ICD-10-CM

## 2022-12-07 ENCOUNTER — Other Ambulatory Visit: Payer: Self-pay | Admitting: Family Medicine

## 2022-12-09 ENCOUNTER — Other Ambulatory Visit: Payer: Self-pay | Admitting: Family Medicine

## 2022-12-14 ENCOUNTER — Other Ambulatory Visit: Payer: Self-pay | Admitting: Family Medicine

## 2022-12-14 DIAGNOSIS — R053 Chronic cough: Secondary | ICD-10-CM

## 2022-12-14 DIAGNOSIS — R109 Unspecified abdominal pain: Secondary | ICD-10-CM

## 2022-12-20 ENCOUNTER — Encounter: Payer: Self-pay | Admitting: Family Medicine

## 2022-12-22 ENCOUNTER — Telehealth: Payer: Self-pay

## 2022-12-22 ENCOUNTER — Other Ambulatory Visit: Payer: Self-pay | Admitting: Family Medicine

## 2022-12-22 DIAGNOSIS — F900 Attention-deficit hyperactivity disorder, predominantly inattentive type: Secondary | ICD-10-CM

## 2022-12-22 MED ORDER — METHYLPHENIDATE HCL ER (LA) 20 MG PO CP24
20.0000 mg | ORAL_CAPSULE | ORAL | 0 refills | Status: DC
Start: 2023-02-20 — End: 2023-04-02

## 2022-12-22 MED ORDER — METHYLPHENIDATE HCL 20 MG PO TABS
ORAL_TABLET | ORAL | 0 refills | Status: DC
Start: 2023-02-20 — End: 2023-04-02

## 2022-12-22 MED ORDER — METHYLPHENIDATE HCL ER (LA) 20 MG PO CP24: 20.0000 mg | ORAL_CAPSULE | ORAL | 0 refills | Status: DC

## 2022-12-22 MED ORDER — METHYLPHENIDATE HCL 20 MG PO TABS
ORAL_TABLET | ORAL | 0 refills | Status: DC
Start: 2023-01-21 — End: 2023-04-03

## 2022-12-22 MED ORDER — METHYLPHENIDATE HCL 20 MG PO TABS: ORAL_TABLET | ORAL | 0 refills | Status: DC

## 2022-12-22 NOTE — Telephone Encounter (Signed)
Spoke with [employer] and okay manufacture change on the Levothyroxine

## 2022-12-31 ENCOUNTER — Encounter: Payer: Self-pay | Admitting: Family Medicine

## 2022-12-31 ENCOUNTER — Ambulatory Visit (INDEPENDENT_AMBULATORY_CARE_PROVIDER_SITE_OTHER): Payer: PRIVATE HEALTH INSURANCE | Admitting: Family Medicine

## 2022-12-31 VITALS — BP 128/76 | HR 83 | Temp 92.0°F | Resp 16 | Ht 62.0 in | Wt 205.0 lb

## 2022-12-31 DIAGNOSIS — E1165 Type 2 diabetes mellitus with hyperglycemia: Secondary | ICD-10-CM

## 2022-12-31 DIAGNOSIS — Z1211 Encounter for screening for malignant neoplasm of colon: Secondary | ICD-10-CM

## 2022-12-31 DIAGNOSIS — Z794 Long term (current) use of insulin: Secondary | ICD-10-CM | POA: Diagnosis not present

## 2022-12-31 DIAGNOSIS — Z0000 Encounter for general adult medical examination without abnormal findings: Secondary | ICD-10-CM | POA: Diagnosis not present

## 2022-12-31 DIAGNOSIS — Z1231 Encounter for screening mammogram for malignant neoplasm of breast: Secondary | ICD-10-CM

## 2022-12-31 DIAGNOSIS — G2581 Restless legs syndrome: Secondary | ICD-10-CM | POA: Diagnosis not present

## 2022-12-31 DIAGNOSIS — R053 Chronic cough: Secondary | ICD-10-CM | POA: Diagnosis not present

## 2022-12-31 MED ORDER — ROPINIROLE HCL 2 MG PO TABS: ORAL_TABLET | ORAL | Status: DC

## 2022-12-31 MED ORDER — FAMOTIDINE 20 MG PO TABS: 20.0000 mg | ORAL_TABLET | Freq: Two times a day (BID) | ORAL | 1 refills | Status: DC

## 2022-12-31 MED ORDER — GABAPENTIN 600 MG PO TABS: ORAL_TABLET | ORAL | Status: DC

## 2022-12-31 MED ORDER — ROPINIROLE HCL 5 MG PO TABS: 5.0000 mg | ORAL_TABLET | Freq: Every day | ORAL | 1 refills | Status: DC

## 2022-12-31 NOTE — Progress Notes (Signed)
Chief Complaint  Patient presents with   Annual Exam    Annual Exam     Well Woman Cristina Burns is here for a complete physical.   Her last physical was >1 year ago.  Current diet: in general, a "healthy" diet. Current exercise: none. Weight is internationally losing (on [place]) and she denies fatigue out of ordinary. Seatbelt? Yes Advanced directive? No  Health Maintenance Pap/HPV- No cervix Mammogram- No Colon cancer screening-No Shingrix- Yes Tetanus- Yes Hep C screening- Yes HIV screening- Yes  Patient has been experiencing a dry cough that happens both during the day and at night for the past 8 to 9 months.  It is neither worsening nor improving.  She will sometimes have associated drainage in her throat.  No obvious association with meals or position.  She does have a history of reflux which she feels is not fully controlled with Protonix 40 mg twice daily.  She has a history of allergies and is taking Flonase, Astelin nasal spray, Xyzal, and Singulair.  She is compliant with this regimen.  No wheezing, shortness of breath, new fluid retention, or unexplained weight gain.  The patient has a history of RLS.  She is currently taking Requip 2 mg in the afternoon and 4 mg in the evening.  She also takes gabapentin 600 mg in the afternoon and 1200 mg in the evening.  She reports compliance and no adverse effects.  Things have been worsening over the past several months.  Past Medical History:  Diagnosis Date   ADHD (attention deficit hyperactivity disorder)    Allergic rhinitis    Allergy    Diabetes mellitus without complication (HCC)    Diabetic neuropathy (HCC)    GERD (gastroesophageal reflux disease)    Hypertension    Hypothyroidism    RLS (restless legs syndrome)    Stomach ulcer "it's been a long time"     Past Surgical History:  Procedure Laterality Date   ABDOMINAL HYSTERECTOMY  2002   ACHILLES TENDON REPAIR  2009   CHOLECYSTECTOMY  1982   ROTATOR CUFF  REPAIR Right 2011    Medications  Current Outpatient Medications on File Prior to Visit  Medication Sig Dispense Refill   albuterol (VENTOLIN HFA) 108 (90 Base) MCG/ACT inhaler Inhale 1-2 puffs into the lungs every 6 (six) hours as needed for wheezing or shortness of breath. 6.7 g 0   amLODipine (NORVASC) 5 MG tablet Take 1 tablet (5 mg total) by mouth daily. 90 tablet 1   atorvastatin (LIPITOR) 40 MG tablet Take 1 tablet by mouth once daily 90 tablet 0   Azelastine HCl 137 MCG/SPRAY SOLN USE 1 SPRAY(S) IN EACH NOSTRIL IN THE MORNING AND AT BEDTIME 30 mL 0   B Complex-C (SUPER B COMPLEX PO) Take 1 capsule by mouth daily.     celecoxib (CELEBREX) 200 MG capsule Take 200 mg by mouth daily.     cholecalciferol (VITAMIN D) 1000 units tablet Take 1 tablet (1,000 Units total) by mouth daily. 30 tablet 0   Continuous Blood Gluc Sensor (DEXCOM G7 SENSOR) MISC 1 Device by Does not apply route as directed. 9 each 3   dicyclomine (BENTYL) 10 MG capsule TAKE 1 CAPSULE BY MOUTH EVERY 6 HOURS AS NEEDED FOR  ABDOMINAL  CRAMPING 60 capsule 0   furosemide (LASIX) 40 MG tablet Take 1 tablet by mouth twice daily 60 tablet 0   gabapentin (NEURONTIN) 600 MG tablet Take 1 tablet (600 mg total) by mouth 3 (  three) times daily. 270 tablet 3   insulin glargine, 1 Unit Dial, (TOUJEO SOLOSTAR) 300 UNIT/ML Solostar Pen Inject 50 Units into the skin in the morning and at bedtime. 30 mL 3   insulin lispro (HUMALOG KWIKPEN) 100 UNIT/ML KwikPen 80 units max daily dose. 75 mL 2   insulin lispro (HUMALOG KWIKPEN) 200 UNIT/ML KwikPen Max daily 80 units 15 mL 3   Insulin Pen Needle 32G X 4 MM MISC 1 Device by Does not apply route in the morning, at noon, in the evening, and at bedtime. 400 each 3   levocetirizine (XYZAL) 5 MG tablet TAKE 1 TABLET BY MOUTH ONCE DAILY IN THE EVENING 90 tablet 0   levothyroxine (SYNTHROID) 125 MCG tablet Take 1 tablet (125 mcg total) by mouth daily. 90 tablet 3   losartan (COZAAR) 50 MG tablet Take  1 tablet (50 mg total) by mouth daily. 90 tablet 3   metFORMIN (GLUCOPHAGE-XR) 500 MG 24 hr tablet Take 1 tablet (500 mg total) by mouth 2 (two) times daily with a meal. 180 tablet 3   [START ON 02/20/2023] methylphenidate (RITALIN LA) 20 MG 24 hr capsule Take 1 capsule (20 mg total) by mouth every morning. 30 capsule 0   [START ON 01/21/2023] methylphenidate (RITALIN LA) 20 MG 24 hr capsule Take 1 capsule (20 mg total) by mouth every morning. 30 capsule 0   methylphenidate (RITALIN LA) 20 MG 24 hr capsule Take 1 capsule (20 mg total) by mouth every morning. 30 capsule 0   [START ON 02/20/2023] methylphenidate (RITALIN) 20 MG tablet Daily after lunch. 30 tablet 0   [START ON 01/21/2023] methylphenidate (RITALIN) 20 MG tablet Take 1 tab in the afternoon daily. 30 tablet 0   methylphenidate (RITALIN) 20 MG tablet Take daily after lunch. 30 tablet 0   montelukast (SINGULAIR) 10 MG tablet TAKE 1 TABLET BY MOUTH AT BEDTIME 90 tablet 0   multivitamin-iron-minerals-folic acid (CENTRUM) chewable tablet Chew 1 tablet by mouth daily.     pantoprazole (PROTONIX) 40 MG tablet TAKE 1 TABLET BY MOUTH TWICE DAILY BEFORE A MEAL 180 tablet 0   rOPINIRole (REQUIP) 2 MG tablet TAKE 1 TABLET MY MOUTH IN THE MORNING AND 2 AT BEDTIME. 270 tablet 2   tirzepatide (MOUNJARO) 2.5 MG/0.5ML Pen Inject 2.5 mg into the skin once a week. 6 mL 3   triamcinolone (NASACORT ALLERGY 24HR) 55 MCG/ACT AERO nasal inhaler Place 2 sprays into the nose daily. 1 Inhaler 2   triamterene-hydrochlorothiazide (MAXZIDE-25) 37.5-25 MG tablet Take 1 tablet by mouth daily. 90 tablet 1   glucose blood test strip Use twice daily to check blood sugar.  DX E11.9      Allergies Allergies  Allergen Reactions   Codeine Hives   Darvon [Propoxyphene] Hives    "Darvocet"   Doxycycline     Double Vision   Erythromycin     Stomach pain   Keflex [Cephalexin] Hives    Review of Systems: Constitutional:  no unexpected weight changes Eye:  no recent  significant change in vision Ear/Nose/Mouth/Throat:  Ears:  no recent change in hearing Nose/Mouth/Throat:  no complaints of nasal congestion, no sore throat Cardiovascular: no chest pain Respiratory:  no shortness of breath Gastrointestinal:  no abdominal pain, no change in bowel habits GU:  Female: negative for dysuria or pelvic pain Musculoskeletal/Extremities:  no pain of the joints Integumentary (Skin/Breast):  no abnormal skin lesions reported Neurologic:  no headaches Endocrine:  denies fatigue  Exam BP 128/76 (BP Location: Left  Arm, Patient Position: Sitting, Cuff Size: Normal)   Pulse 83   Temp (!) 92 F (33.3 C) (Oral)   Resp 16   Ht 5\' 2"  (1.575 m)   Wt 205 lb (93 kg)   SpO2 95%   BMI 37.49 kg/m  General:  well developed, well nourished, in no apparent distress Skin:  no significant moles, warts, or growths Head:  no masses, lesions, or tenderness Eyes:  pupils equal and round, sclera anicteric without injection Ears:  canals without lesions, TMs shiny without retraction, no obvious effusion, no erythema Nose:  nares patent, mucosa normal, and no drainage  Throat/Pharynx:  lips and gingiva without lesion; tongue and uvula midline; non-inflamed pharynx; no exudates or postnasal drainage Neck: neck supple without adenopathy, thyromegaly, or masses Lungs:  clear to auscultation, breath sounds equal bilaterally, no respiratory distress Cardio:  regular rate and rhythm, no LE edema Abdomen:  abdomen soft, nontender; bowel sounds normal; no masses or organomegaly Genital: Defer to GYN Musculoskeletal:  symmetrical muscle groups noted without atrophy or deformity Extremities:  no clubbing, cyanosis, or edema, no deformities, no skin discoloration Neuro:  gait normal; deep tendon reflexes normal and symmetric Psych: well oriented with normal range of affect and appropriate judgment/insight  Assessment and Plan  Well adult exam - Plan: Comprehensive metabolic panel, Lipid  panel  Encounter for screening mammogram for malignant neoplasm of breast - Plan: MM DIGITAL SCREENING BILATERAL  Chronic cough  RLS (restless legs syndrome) - Plan: rOPINIRole (REQUIP) 2 MG tablet, ropinirole (REQUIP) 5 MG tablet, DISCONTINUED: rOPINIRole (REQUIP) 2 MG tablet  Type 2 diabetes mellitus with hyperglycemia, with long-term current use of insulin (HCC) - Plan: gabapentin (NEURONTIN) 600 MG tablet  Screening for colon cancer - Plan: Cologuard   Well 62 y.o. female. Counseled on diet and exercise. Advanced directive form provided today.  CSC updated today.  Mammogram ordered today. Cologuard ordered.  RLS: Continue Requip 2 mg in the afternoon, increase nightly dosage to 5 mg.  Continue gabapentin 600 mg in the afternoon and 12 mg in the evening.  Would consider primidone or possibly of benzo/referral if no improvement.  Follow-up in 1 month. Chronic cough: Chronic, uncontrolled.  Continue allergy treatment for upper airway cough syndrome.  Continue Protonix 40 mg twice daily.  Add Pepcid 20 mg twice daily to help with this as well.  If no improvement, will consider pulmonary function testing and referral. Other orders as above. The patient voiced understanding and agreement to the plan.  Jilda Roche Strum, DO 12/31/22 5:07 PM

## 2022-12-31 NOTE — Patient Instructions (Addendum)
Give [place] 2-3 business days to get the results of your labs back.   Keep the diet clean and stay active.  Please get me a copy of your advanced directive form at your convenience.   Pepcid/famotidine 20 mg 1-2 times daily can help with reflux symptoms.   Let us know if you need anything.

## 2023-01-01 LAB — COMPREHENSIVE METABOLIC PANEL
ALT: 23 U/L (ref 0–35)
AST: 25 U/L (ref 0–37)
Albumin: 4.3 g/dL (ref 3.5–5.2)
Alkaline Phosphatase: 118 U/L — ABNORMAL HIGH (ref 39–117)
BUN: 28 mg/dL — ABNORMAL HIGH (ref 6–23)
CO2: 29 meq/L (ref 19–32)
Calcium: 9.9 mg/dL (ref 8.4–10.5)
Chloride: 104 meq/L (ref 96–112)
Creatinine, Ser: 1.14 mg/dL (ref 0.40–1.20)
GFR: 51.63 mL/min — ABNORMAL LOW (ref >60.00)
Glucose, Bld: 162 mg/dL — ABNORMAL HIGH (ref 70–99)
Potassium: 4.4 meq/L (ref 3.5–5.1)
Sodium: 144 meq/L (ref 135–145)
Total Bilirubin: 0.4 mg/dL (ref 0.2–1.2)
Total Protein: 7.3 g/dL (ref 6.0–8.3)

## 2023-01-01 LAB — LIPID PANEL
Cholesterol: 134 mg/dL (ref 0–200)
HDL: 42.1 mg/dL (ref >39.00)
LDL Cholesterol: 58 mg/dL (ref 0–99)
NonHDL: 92.33
Total CHOL/HDL Ratio: 3
Triglycerides: 172 mg/dL — ABNORMAL HIGH (ref 0.0–149.0)
VLDL: 34.4 mg/dL (ref 0.0–40.0)

## 2023-01-02 ENCOUNTER — Other Ambulatory Visit: Payer: Self-pay | Admitting: Family Medicine

## 2023-01-02 DIAGNOSIS — K219 Gastro-esophageal reflux disease without esophagitis: Secondary | ICD-10-CM

## 2023-01-05 ENCOUNTER — Encounter: Payer: Medicaid Other | Admitting: Family Medicine

## 2023-01-08 LAB — HM DIABETES EYE EXAM

## 2023-01-18 ENCOUNTER — Other Ambulatory Visit: Payer: Self-pay | Admitting: Family Medicine

## 2023-02-04 ENCOUNTER — Encounter: Payer: Self-pay | Admitting: Family Medicine

## 2023-02-04 ENCOUNTER — Ambulatory Visit: Payer: PRIVATE HEALTH INSURANCE | Admitting: Family Medicine

## 2023-02-04 VITALS — BP 132/64 | HR 91 | Temp 98.0°F | Resp 18 | Ht 62.0 in | Wt 213.0 lb

## 2023-02-04 DIAGNOSIS — G2581 Restless legs syndrome: Secondary | ICD-10-CM | POA: Diagnosis not present

## 2023-02-04 DIAGNOSIS — R053 Chronic cough: Secondary | ICD-10-CM | POA: Diagnosis not present

## 2023-02-04 NOTE — Progress Notes (Signed)
 Chief Complaint  Patient presents with   Follow-up    Medication check    Subjective: Patient is a 63 y.o. female here for f/u.  RLS-patient was seen 1 month ago and placed on Requip  2 mg in the afternoon, 5 mg in the evening in addition to gabapentin  600 mg in the afternoon and 1200 mg in the evening.  She reports 75% improvement since making this change.  She reports compliance with medication and no adverse effects other than drowsiness with her nighttime Requip  dose.  Because she takes it at night, it does not affect her daily activities.  Chronic cough The patient has been taking Protonix  40 mg twice daily in addition to treatment for upper airway cough syndrome.  She was recently started on Pepcid  20 mg twice daily.  She reports compliance and no adverse effects.  Her cough is approximately 90% improved since starting the medication.  She does not wish to make any changes.  No abdominal pain, wheezing, shortness of breath.  Past Medical History:  Diagnosis Date   ADHD (attention deficit hyperactivity disorder)    Allergic rhinitis    Allergy    Diabetes mellitus without complication (HCC)    Diabetic neuropathy (HCC)    GERD (gastroesophageal reflux disease)    Hypertension    Hypothyroidism    RLS (restless legs syndrome)    Stomach ulcer "it's been a long time"    Objective: BP 132/64 (BP Location: Left Arm, Patient Position: Sitting, Cuff Size: Large)   Pulse 91   Temp 98 F (36.7 C) (Oral)   Resp 18   Ht 5\' 2"  (1.575 m)   Wt 213 lb (96.6 kg)   SpO2 100%   BMI 38.96 kg/m  General: Awake, appears stated age Heart: RRR, no LE edema Lungs: CTAB, no rales, wheezes or rhonchi. No accessory muscle use Psych: Age appropriate judgment and insight, normal affect and mood  Assessment and Plan: RLS (restless legs syndrome)  Chronic cough  Chronic, stable.  Continue gabapentin  600 mg afternoon, 1200 mg in the evening, frequent 2 mg afternoon, 5 mg in the  evening. Chronic, stable.  Continue nasal sprays, Protonix  40 mg twice daily, Pepcid  20 mg twice daily. I will see her in 5 months. The patient voiced understanding and agreement to the plan.  Shellie Dials [place], DO 02/04/23  3:40 PM

## 2023-02-04 NOTE — Patient Instructions (Signed)
 Let me know if you want to have a medication change.   Keep the diet clean and stay active.  Let us  know if you need anything.

## 2023-02-19 ENCOUNTER — Other Ambulatory Visit: Payer: Self-pay | Admitting: Family Medicine

## 2023-02-19 DIAGNOSIS — R109 Unspecified abdominal pain: Secondary | ICD-10-CM

## 2023-02-19 DIAGNOSIS — R053 Chronic cough: Secondary | ICD-10-CM

## 2023-02-19 DIAGNOSIS — L299 Pruritus, unspecified: Secondary | ICD-10-CM

## 2023-02-21 ENCOUNTER — Other Ambulatory Visit: Payer: Self-pay | Admitting: Internal Medicine

## 2023-02-26 LAB — COLOGUARD: COLOGUARD: POSITIVE — AB

## 2023-02-27 ENCOUNTER — Other Ambulatory Visit: Payer: Self-pay

## 2023-02-27 ENCOUNTER — Encounter: Payer: Self-pay | Admitting: Family Medicine

## 2023-02-27 ENCOUNTER — Other Ambulatory Visit: Payer: Self-pay | Admitting: Family Medicine

## 2023-02-27 DIAGNOSIS — R195 Other fecal abnormalities: Secondary | ICD-10-CM

## 2023-03-04 ENCOUNTER — Encounter: Payer: Self-pay | Admitting: Internal Medicine

## 2023-03-04 ENCOUNTER — Ambulatory Visit: Payer: PRIVATE HEALTH INSURANCE | Admitting: Internal Medicine

## 2023-03-04 VITALS — BP 128/70 | HR 70 | Ht 62.0 in | Wt 214.0 lb

## 2023-03-04 DIAGNOSIS — E039 Hypothyroidism, unspecified: Secondary | ICD-10-CM

## 2023-03-04 DIAGNOSIS — Z794 Long term (current) use of insulin: Secondary | ICD-10-CM

## 2023-03-04 DIAGNOSIS — R635 Abnormal weight gain: Secondary | ICD-10-CM | POA: Diagnosis not present

## 2023-03-04 DIAGNOSIS — E1142 Type 2 diabetes mellitus with diabetic polyneuropathy: Secondary | ICD-10-CM | POA: Diagnosis not present

## 2023-03-04 DIAGNOSIS — E1165 Type 2 diabetes mellitus with hyperglycemia: Secondary | ICD-10-CM

## 2023-03-04 MED ORDER — METFORMIN HCL ER 500 MG PO TB24: 500.0000 mg | ORAL_TABLET | Freq: Two times a day (BID) | ORAL | 3 refills | Status: DC

## 2023-03-04 MED ORDER — OMNIPOD 5 G7 PODS (GEN 5) MISC: 1.0000 | 3 refills | Status: DC

## 2023-03-04 MED ORDER — TOUJEO SOLOSTAR 300 UNIT/ML ~~LOC~~ SOPN: 52.0000 [IU] | PEN_INJECTOR | Freq: Two times a day (BID) | SUBCUTANEOUS | 3 refills | Status: DC

## 2023-03-04 MED ORDER — OMNIPOD 5 G7 INTRO (GEN 5) KIT: 1.0000 | PACK | 0 refills | Status: DC

## 2023-03-04 MED ORDER — GLUCOSE BLOOD VI STRP: 1.0000 | ORAL_STRIP | Freq: Every day | 3 refills | Status: AC

## 2023-03-04 MED ORDER — INSULIN LISPRO (1 UNIT DIAL) 100 UNIT/ML (KWIKPEN): PEN_INJECTOR | SUBCUTANEOUS | 3 refills | Status: DC

## 2023-03-04 MED ORDER — LEVOTHYROXINE SODIUM 125 MCG PO TABS: 125.0000 ug | ORAL_TABLET | Freq: Every day | ORAL | 3 refills | Status: DC

## 2023-03-04 MED ORDER — HUMALOG KWIKPEN 200 UNIT/ML ~~LOC~~ SOPN: PEN_INJECTOR | SUBCUTANEOUS | 3 refills | Status: DC

## 2023-03-04 NOTE — Progress Notes (Unsigned)
Name: Cristina Burns  Age/ Sex: 63 y.o., female   MRN/ DOB: [id number], [birth year]     PCP: Sharlene Dory, DO   Reason for Endocrinology Evaluation: Type 2 Diabetes Mellitus  Initial Endocrine Consultative Visit: 05/08/2020    PATIENT IDENTIFIER: Cristina Burns is a 63 y.o. female with a past medical history of T2DM, RLS, Hypothyroidism and HTN. The patient has followed with Endocrinology clinic since 05/08/2020 for consultative assistance with management of her diabetes.  DIABETIC HISTORY:  Cristina Burns was diagnosed with DM in 2000, Trulicity - stomach issues. Invokana- recurrent yeast infections. Her hemoglobin A1c has ranged from  7.2%in 2021, peaking at 10.6% in 2018.   On her initial visit to our clinic her A1c 7.8%, she was on Glimepiride, MDI regimen and Ozempic. We stopped Glimepiride, continued Metfomrin,MDI regimen and decreased Ozempic due to nausea    Invokana caused  yeast infection   She stopped Ozempic it by her visit in 09/2020 She was trained on OmniPod use 09/2021 but was unable to sustain it due to cost issues  Started [facility] 02/2022  We decreased  metformin by 50% due to low GFR    SUBJECTIVE:   During the last visit (08/29/2022): A1c 6.7 %     Today (03/04/2023): Cristina Burns  is here for a follow up on diabetes management. She checks her blood sugars 3 times daily . The patient has  had hypoglycemic episodes since the last clinic visit.  She continues with abdominal pain and diarrhea due to IBS    HOME DIABETES REGIMEN:  Metformin 500 mg XR 1 tabs twice daily Mounjaro 2.5 mg weekly  Toujeo 50 units BID  Humalog 12 units TIDQAC CF: Humalog (BG-140/10)     Statin: yes ACE-I/ARB: yes Prior Diabetic Education: yes    CONTINUOUS GLUCOSE MONITORING RECORD INTERPRETATION    Dates of Recording: 1/30-2/12/2023  Sensor description: dexcom  Results statistics:   CGM use % of time 97  Average and SD 230/30.5  Time in range  25 %  %  Time Above 180 42  % Time above 250 33  % Time Below target 0   Glycemic patterns summary: BGs are high throughout the day and night  Hyperglycemic episodes  postprandial   Hypoglycemic episodes occurred n/a  Overnight periods: high   DIABETIC COMPLICATIONS: Microvascular complications:  Neuropathy Denies: CKD, retinopathy Last Eye Exam: Completed 2023  Macrovascular complications:   Denies: CAD, CVA, PVD   HISTORY:  Past Medical History:  Past Medical History:  Diagnosis Date   ADHD (attention deficit hyperactivity disorder)    Allergic rhinitis    Allergy    Diabetes mellitus without complication (HCC)    Diabetic neuropathy (HCC)    GERD (gastroesophageal reflux disease)    Hypertension    Hypothyroidism    RLS (restless legs syndrome)    Stomach ulcer "it's been a long time"   Past Surgical History:  Past Surgical History:  Procedure Laterality Date   ABDOMINAL HYSTERECTOMY  2002   ACHILLES TENDON REPAIR  2009   CHOLECYSTECTOMY  1982   ROTATOR CUFF REPAIR Right 2011   Social History:  reports that she quit smoking about 40 years ago. Her smoking use included cigarettes. She started smoking about 49 years ago. She has a 18 pack-year smoking history. She has never used smokeless tobacco. She reports that she does not drink alcohol and does not use drugs. Family History:  Family History  Problem Relation Age of Onset  Heart disease Father    Heart attack Father    Heart disease Brother        CABG   Diabetes Brother    Kidney disease Brother    Cirrhosis Brother    Hypertension Brother    Heart attack Brother    Heart disease Brother    Diabetes Brother    Heart attack Brother    Diabetes Mother    Breast cancer Mother    Stroke Mother      HOME MEDICATIONS: Allergies as of 03/04/2023       Reactions   Codeine Hives   Darvon [propoxyphene] Hives   "Darvocet"   Doxycycline    Double Vision   Erythromycin    Stomach pain   Keflex  [cephalexin] Hives        Medication List        Accurate as of March 04, 2023  3:04 PM. If you have any questions, ask your nurse or doctor.          STOP taking these medications    celecoxib 200 MG capsule Commonly known as: CELEBREX Stopped by: Johnney Ou Barri Neidlinger   cholecalciferol 25 MCG (1000 UNIT) tablet Commonly known as: VITAMIN D3 Stopped by: Johnney Ou Fredi Geiler   SUPER B COMPLEX PO Stopped by: Johnney Ou Evvie Behrmann       TAKE these medications    albuterol 108 (90 Base) MCG/ACT inhaler Commonly known as: VENTOLIN HFA Inhale 1-2 puffs into the lungs every 6 (six) hours as needed for wheezing or shortness of breath.   amLODipine 5 MG tablet Commonly known as: NORVASC Take 1 tablet (5 mg total) by mouth daily.   atorvastatin 40 MG tablet Commonly known as: LIPITOR Take 1 tablet by mouth once daily   Azelastine HCl 137 MCG/SPRAY Soln Place 1 spray into both nostrils in the morning and at bedtime.   Dexcom G7 Sensor Misc USE AS DIRECTED EVERY  10  DAYS   dicyclomine 10 MG capsule Commonly known as: BENTYL Take 1 capsule (10 mg total) by mouth every 6 (six) hours as needed for spasms.   famotidine 20 MG tablet Commonly known as: PEPCID Take 1 tablet (20 mg total) by mouth 2 (two) times daily.   furosemide 40 MG tablet Commonly known as: LASIX Take 1 tablet by mouth twice daily   gabapentin 600 MG tablet Commonly known as: NEURONTIN Take 1 tab in the afternoon. Take 2 tabs in the evening.   HumaLOG KwikPen 200 UNIT/ML KwikPen Generic drug: insulin lispro Max daily 80 units   insulin lispro 100 UNIT/ML KwikPen Commonly known as: HumaLOG KwikPen 80 units max daily dose.   Insulin Pen Needle 32G X 4 MM Misc 1 Device by Does not apply route in the morning, at noon, in the evening, and at bedtime.   levocetirizine 5 MG tablet Commonly known as: XYZAL TAKE 1 TABLET BY MOUTH ONCE DAILY IN THE EVENING   levothyroxine 125 MCG  tablet Commonly known as: SYNTHROID Take 1 tablet (125 mcg total) by mouth daily.   losartan 50 MG tablet Commonly known as: COZAAR Take 1 tablet (50 mg total) by mouth daily.   metFORMIN 500 MG 24 hr tablet Commonly known as: GLUCOPHAGE-XR Take 1 tablet (500 mg total) by mouth 2 (two) times daily with a meal.   methylphenidate 20 MG tablet Commonly known as: RITALIN Take daily after lunch.   methylphenidate 20 MG 24 hr capsule Commonly known as: RITALIN LA Take 1 capsule (  20 mg total) by mouth every morning.   methylphenidate 20 MG tablet Commonly known as: RITALIN Take 1 tab in the afternoon daily.   methylphenidate 20 MG 24 hr capsule Commonly known as: RITALIN LA Take 1 capsule (20 mg total) by mouth every morning.   methylphenidate 20 MG tablet Commonly known as: Ritalin Daily after lunch.   methylphenidate 20 MG 24 hr capsule Commonly known as: RITALIN LA Take 1 capsule (20 mg total) by mouth every morning.   montelukast 10 MG tablet Commonly known as: SINGULAIR Take 1 tablet (10 mg total) by mouth at bedtime.   multivitamin-iron-minerals-folic acid chewable tablet Chew 1 tablet by mouth daily.   pantoprazole 40 MG tablet Commonly known as: PROTONIX TAKE 1 TABLET BY MOUTH TWICE DAILY BEFORE A MEAL   rOPINIRole 2 MG tablet Commonly known as: REQUIP TAKE 1 TABLET IN THE AFTERNOON.   ropinirole 5 MG tablet Commonly known as: REQUIP Take 1 tablet (5 mg total) by mouth at bedtime.   tirzepatide 2.5 MG/0.5ML Pen Commonly known as: MOUNJARO Inject 2.5 mg into the skin once a week.   Toujeo SoloStar 300 UNIT/ML Solostar Pen Generic drug: insulin glargine (1 Unit Dial) Inject 50 Units into the skin in the morning and at bedtime.   triamcinolone 55 MCG/ACT Aero nasal inhaler Commonly known as: Nasacort Allergy 24HR Place 2 sprays into the nose daily.   triamterene-hydrochlorothiazide 37.5-25 MG tablet Commonly known as: MAXZIDE-25 Take 1 tablet by  mouth daily.         OBJECTIVE:   Vital Signs: BP 128/70 (BP Location: Left Arm, Patient Position: Sitting, Cuff Size: Normal)   Pulse 70   Ht 5\' 2"  (1.575 m)   Wt 214 lb (97.1 kg)   SpO2 99%   BMI 39.14 kg/m   Wt Readings from Last 3 Encounters:  03/04/23 214 lb (97.1 kg)  02/04/23 213 lb (96.6 kg)  12/31/22 205 lb (93 kg)     Exam: General: Pt appears well and is in NAD  Lungs: Clear with good BS bilat   Heart: RRR   Extremities: 1+ pretibial edema.   Neuro: MS is good with appropriate affect, pt is alert and Ox3    DM foot exam: 03/04/2023  The skin of the feet is intact without sores or ulcerations. The pedal pulses are 2+ on right and 2+ on left. The sensation is intact to a screening 5.07, 10 gram monofilament bilaterally        DATA REVIEWED:  Lab Results  Component Value Date   HGBA1C 6.7 (A) 08/29/2022   HGBA1C 8.8 (A) 02/24/2022   HGBA1C 8.2 (A) 06/05/2021    Latest Reference Range & Units 08/29/22 14:12  Sodium 135 - 145 mEq/L 142  Potassium 3.5 - 5.1 mEq/L 3.9  Chloride 96 - 112 mEq/L 101  CO2 19 - 32 mEq/L 28  Glucose 70 - 99 mg/dL 161 (H)  BUN 6 - 23 mg/dL 39 (H)  Creatinine 0.96 - 1.20 mg/dL 0.45 (H)  Calcium 8.4 - 10.5 mg/dL 40.9  GFR >81.19 mL/min 37.53 (L)     Latest Reference Range & Units 08/29/22 14:12  TSH 0.35 - 5.50 uIU/mL 2.87     Latest Reference Range & Units 10/22/21 15:34  Total CHOL/HDL Ratio  3  Cholesterol 0 - 200 mg/dL 147  HDL Cholesterol >82.95 mg/dL 62.13 (L)  LDL (calc) 0 - 99 mg/dL 46  NonHDL  08.65  Triglycerides 0.0 - 149.0 mg/dL 784.6 (H)  VLDL 0.0 -  40.0 mg/dL 16.1      ASSESSMENT / PLAN / RECOMMENDATIONS:   1) Type 2 Diabetes Mellitus, Optimally controlled, With Neuropathic  complications - Most recent A1c of 8.9%. Goal A1c < 7.0 %.     - A1c increased 6.7% to 8.9% -She had stopped Ozempic due to GI side effects, was unable to tolerate 0.25 mg dose -She is intolerant to Invokana due to  recurrent yeast infections, she is scared of trying other alternatives of SGLT2 inhibitors. -She was trained on OmniPod but unfortunately it was cost prohibitive, tandem is cost prohibitive as well.  Today she would like to go back on the OmniPod, she was under the impression that the longer she uses the OmniPod the less she has to change the pods, I did explain to the patient that that is not how it works and that there will be variability in insulin use as the pump will use IQ technology and she should not have the expectation of using less pods as the time goes on -We did review rule of 15, she did have hypoglycemic episode last night, but this is partly due to the fact that she took prandial dose of insulin plus correction after supper rather than before supper -I emphasized the importance of taking prandial insulin before the meal.  If she happens to forget to take it before the meal then she should only use correction scale as needed -I also advised the patient that she needs to separate Humalog doses by 4 hours to prevent stacking -A prescription for OmniPod has been sent to the pharmacy, the patient was advised to contact our office if she is going to obtain the pump so I can refer her to our CDE for retraining -In the meantime I will increase Toujeo and Humalog as below -I had to decrease her metformin due to a low GFR <45  -Mounjaro will be on hold to determine if her GI symptoms are due to IBS versus Mounjaro -Patient was also advised to calibrate her glucose the first day she changes the Dexcom sensor, she does not have strips, she could not remember the name of the glucose meter at home, generic strips were sent to the pharmacy  MEDICATIONS:   Hold Mounjaro 2.5 mg weekly (samples) Continue metformin 500 mg XR 1 tabs twice daily Increase Toujeo 52 units twice daily Increase Humalog 14 units 3 times daily before every meal Continue correction factor: Humalog (BG -140/20)   EDUCATION /  INSTRUCTIONS: BG monitoring instructions: Patient is instructed to check her blood sugars 3 times a day, before meals. Call [place] if: BG persistently < 70  I reviewed the Rule of 15 for the treatment of hypoglycemia in detail with the patient. Literature supplied.    2) Diabetic complications:  Eye: Does not  have known diabetic retinopathy.  Neuro/ Feet: Does  have known diabetic peripheral neuropathy .  Renal: Patient does not have known baseline CKD. She   is  on an ACEI/ARB at present.    3) Hypothyroidism:  -Patient is clinically euthyroid -TSH is normal 08/2022   Medication  Continue levothyroxine 125 mcg daily   4) Weight gain :  -Patient is intolerant to Trulicity, Ozempic, and she also endorsed GI side effects to Mounjaro 5 mg, today she is questioning if it is her IBS or the actual Mounjaro that was creating GI issues, the patient continues to have GI issues, I did explain to the patient that she is still on  Mounjaro and that could be an exacerbating factor. -She is scheduled to see GI and have a colonoscopy due to abnormal Cologuard -I have asked the patient to [facility] until after the colonoscopy and see if that would improve her GI symptoms at all as there is no way for me to determine whether her symptoms are related to IBS versus Mounjaro without stopping Mounjaro completely. -I have offered to refer her to [facility] clinic, but she declines at this time due to lack of time to add more visits.  F/U in 3 months   I spent 40 minutes preparing to see the patient by review of recent labs, imaging and procedures, obtaining and reviewing separately obtained history, communicating with the patient. ordering medications, tests or procedures, and documenting clinical information in the EHR including the differential Dx, treatment, and any further evaluation and other management   Signed electronically by: Lyndle Herrlich, MD  [facility]  [facility] [address] [place] [place], [place] [address] Phone: [phone] FAX: [phone]   CC: Sharlene Dory, DO [address] [place] [address] Phone: [phone]  Fax: [phone]  Return to Endocrinology clinic as below: Future Appointments  Date Time Provider Department Center  07/06/2023  4:00 PM Sharlene Dory, DO [id number]

## 2023-03-04 NOTE — Patient Instructions (Addendum)
-   Hold  Mounjaro 2.5 mg once weekly  - Continue Metformin 500 mg 1 tabs Twice a day  - Increase Toujeo 52  units twice a day  - Increase  Humalog 14 units with each meal  - Humalog correctional insulin: ADD extra units on insulin to your meal-time Humalog  dose if your blood sugars are higher than 150. Use the scale below to help guide you:   Blood sugar before meal Number of units to inject  Less than 150 0 unit  151- 170 1 units  171 - 190 2 units  191 - 210 3 units  211 - 230 4 units  231 - 250 5 units  251 - 270 6 units      HOW TO TREAT LOW BLOOD SUGARS (Blood sugar LESS THAN 70 MG/DL) Please follow the RULE OF 15 for the treatment of hypoglycemia treatment (when your (blood sugars are less than 70 mg/dL)   STEP 1: Take 15 grams of carbohydrates when your blood sugar is low, which includes:  3-4 GLUCOSE TABS  OR 3-4 OZ OF JUICE OR REGULAR SODA OR ONE TUBE OF GLUCOSE GEL    STEP 2: RECHECK blood sugar in 15 MINUTES STEP 3: If your blood sugar is still low at the 15 minute recheck --> then, go back to STEP 1 and treat AGAIN with another 15 grams of carbohydrates.

## 2023-03-05 ENCOUNTER — Encounter: Payer: Self-pay | Admitting: Internal Medicine

## 2023-03-05 LAB — POCT GLYCOSYLATED HEMOGLOBIN (HGB A1C): Hemoglobin A1C: 8.9 % — AB (ref 4.0–5.6)

## 2023-03-12 ENCOUNTER — Other Ambulatory Visit: Payer: Self-pay | Admitting: Family Medicine

## 2023-03-12 DIAGNOSIS — G2581 Restless legs syndrome: Secondary | ICD-10-CM

## 2023-03-12 DIAGNOSIS — L299 Pruritus, unspecified: Secondary | ICD-10-CM

## 2023-04-02 ENCOUNTER — Other Ambulatory Visit: Payer: Self-pay | Admitting: Family Medicine

## 2023-04-02 DIAGNOSIS — F900 Attention-deficit hyperactivity disorder, predominantly inattentive type: Secondary | ICD-10-CM

## 2023-04-03 ENCOUNTER — Telehealth: Payer: Self-pay

## 2023-04-03 ENCOUNTER — Other Ambulatory Visit: Payer: Self-pay | Admitting: Family Medicine

## 2023-04-03 DIAGNOSIS — K219 Gastro-esophageal reflux disease without esophagitis: Secondary | ICD-10-CM

## 2023-04-03 MED ORDER — METHYLPHENIDATE HCL ER (LA) 20 MG PO CP24
20.0000 mg | ORAL_CAPSULE | ORAL | 0 refills | Status: AC
Start: 2023-04-03 — End: ?

## 2023-04-03 MED ORDER — METHYLPHENIDATE HCL 20 MG PO TABS: ORAL_TABLET | ORAL | 0 refills | Status: AC

## 2023-04-03 MED ORDER — METHYLPHENIDATE HCL 20 MG PO TABS
ORAL_TABLET | ORAL | 0 refills | Status: AC
Start: 2023-04-03 — End: ?

## 2023-04-03 MED ORDER — METHYLPHENIDATE HCL ER (LA) 20 MG PO CP24: 20.0000 mg | ORAL_CAPSULE | ORAL | 0 refills | Status: AC

## 2023-04-03 NOTE — Telephone Encounter (Signed)
 OK, I do rx this med for the pt. Not sure what else is needed. Ty.

## 2023-04-03 NOTE — Telephone Encounter (Signed)
 Copied from CRM [id number]. Topic: Clinical - Medication Question >> Apr 03, 2023  4:27 PM [place] E wrote: Reason for CRM: Samara Deist from [facility] called regarding the patient's methylphenidate (RITALIN LA) 20 MG 24 hr capsule medication. Samara Deist stated that with this being a controlled substance, they have an increased documentation process where information is needed to confirm proper provider-patient relation. Callback number for [facility] is [phone] to discuss.

## 2023-04-03 NOTE — Telephone Encounter (Signed)
 Unable to tell when last refills were sent. Multiple on file.   Last OV: 02/04/23 Next OV: 07/06/23 UDS; 06/04/22 Contract: 01/28/23

## 2023-04-07 ENCOUNTER — Telehealth: Payer: Self-pay

## 2023-04-07 NOTE — Telephone Encounter (Signed)
 Copied from CRM [id number]. Topic: General - Other >> Apr 07, 2023 10:04 AM Truddie Crumble wrote: Reason for CRM: [facility] called stating they need the patient last visit for the methylphenidate (RITALIN) 20 MG tablet

## 2023-04-08 NOTE — Telephone Encounter (Signed)
 Spoke with pharmacy stated they got  a call back on 04/07/23 , no further info needed at this time

## 2023-04-09 ENCOUNTER — Other Ambulatory Visit (HOSPITAL_COMMUNITY): Payer: Self-pay

## 2023-04-09 ENCOUNTER — Telehealth: Payer: Self-pay

## 2023-04-09 NOTE — Telephone Encounter (Signed)
 Pharmacy Patient Advocate Encounter   Received notification from CoverMyMeds that prior authorization for Omnipod kit is required/requested.   Insurance verification completed.   The patient is insured through  [employer])  .   Per test claim: PA required; PA submitted to above mentioned insurance via CoverMyMeds Key/confirmation #/EOC [id number] Status is pending

## 2023-05-05 NOTE — Telephone Encounter (Signed)
 Pharmacy Patient Advocate Encounter  Received notification from  [employer])  that Prior Authorization for Omnipod kit has been APPROVED from 04-21-2023 to 04-20-2024   PA #/Case ID/Reference #: [id number]

## 2023-05-13 ENCOUNTER — Other Ambulatory Visit: Payer: Self-pay | Admitting: Family Medicine

## 2023-05-13 DIAGNOSIS — R053 Chronic cough: Secondary | ICD-10-CM

## 2023-05-13 DIAGNOSIS — G2581 Restless legs syndrome: Secondary | ICD-10-CM

## 2023-06-01 ENCOUNTER — Telehealth: Payer: Self-pay

## 2023-06-01 NOTE — Telephone Encounter (Signed)
 Done

## 2023-06-03 ENCOUNTER — Ambulatory Visit: Payer: PRIVATE HEALTH INSURANCE | Admitting: Internal Medicine

## 2023-06-08 ENCOUNTER — Other Ambulatory Visit: Payer: Self-pay | Admitting: Family Medicine

## 2023-07-02 ENCOUNTER — Other Ambulatory Visit: Payer: Self-pay | Admitting: Family Medicine

## 2023-07-02 DIAGNOSIS — K219 Gastro-esophageal reflux disease without esophagitis: Secondary | ICD-10-CM

## 2023-07-06 ENCOUNTER — Encounter: Payer: Self-pay | Admitting: Family Medicine

## 2023-07-06 ENCOUNTER — Telehealth (INDEPENDENT_AMBULATORY_CARE_PROVIDER_SITE_OTHER): Payer: PRIVATE HEALTH INSURANCE | Admitting: Family Medicine

## 2023-07-06 DIAGNOSIS — I10 Essential (primary) hypertension: Secondary | ICD-10-CM

## 2023-07-06 DIAGNOSIS — F988 Other specified behavioral and emotional disorders with onset usually occurring in childhood and adolescence: Secondary | ICD-10-CM

## 2023-07-06 NOTE — Progress Notes (Signed)
 Chief Complaint  Patient presents with   Follow-up    Subjective Cristina Burns is a 63 y.o. female who presents for hypertension follow up. We are interacting via web portal for an electronic face-to-face visit. I verified patient's ID using 2 identifiers. Patient agreed to proceed with visit via this method. Patient is at home, I am at office. Patient and I are present for visit.   She does not monitor home blood pressures. She is compliant with medications- Maxzide 37.5-25 mg/d, Norvasc  5 mg/d. Patient has these side effects of medication: none She is not adhering to a healthy diet overall. Current exercise: none No CP or SOB.   ADHD Patient is currently on Ritalin  LA 20 mg/d and 20 short acting in the afternoon and compliance is excellent. Symptoms are well controlled Side effects include: None. Patient believes their dose should be not significantly changed. Denies tics, weight loss, difficulties with sleep, self-medication, alcohol/drug abuse, chest pain, or palpitations.   Past Medical History:  Diagnosis Date   ADHD (attention deficit hyperactivity disorder)    Allergic rhinitis    Allergy    Diabetes mellitus without complication (HCC)    Diabetic neuropathy (HCC)    GERD (gastroesophageal reflux disease)    Hypertension    Hypothyroidism    RLS (restless legs syndrome)    Stomach ulcer it's been a long time    Exam No conversational dyspnea Age appropriate judgment and insight Nml affect and mood  Essential hypertension  Attention deficit disorder, unspecified type  Chronic, stable.  Continue Maxide HCT 37.5-25 mg daily, Norvasc  5 mg daily.  Counseled on diet and exercise. Chronic, stable.  Continue Ritalin  LA 20 mg daily with a 20 mg short acting dosage in the afternoon as needed.  Update UDS and CSC at next visit. F/u in 6 mo. The patient voiced understanding and agreement to the plan.  Cristina Dials [place], DO 07/06/23  4:19 PM

## 2023-07-08 ENCOUNTER — Encounter: Payer: Self-pay | Admitting: Internal Medicine

## 2023-07-08 ENCOUNTER — Ambulatory Visit (INDEPENDENT_AMBULATORY_CARE_PROVIDER_SITE_OTHER): Payer: PRIVATE HEALTH INSURANCE | Admitting: Internal Medicine

## 2023-07-08 VITALS — BP 130/70 | HR 74 | Ht 62.0 in | Wt 219.0 lb

## 2023-07-08 DIAGNOSIS — E039 Hypothyroidism, unspecified: Secondary | ICD-10-CM

## 2023-07-08 DIAGNOSIS — E1165 Type 2 diabetes mellitus with hyperglycemia: Secondary | ICD-10-CM

## 2023-07-08 DIAGNOSIS — Z794 Long term (current) use of insulin: Secondary | ICD-10-CM

## 2023-07-08 DIAGNOSIS — E1142 Type 2 diabetes mellitus with diabetic polyneuropathy: Secondary | ICD-10-CM

## 2023-07-08 LAB — POCT GLYCOSYLATED HEMOGLOBIN (HGB A1C): Hemoglobin A1C: 11.5 % — AB (ref 4.0–5.6)

## 2023-07-08 MED ORDER — INSULIN REGULAR HUMAN 100 UNIT/ML IJ SOLN: 20.0000 [IU] | Freq: Three times a day (TID) | INTRAMUSCULAR | 11 refills | Status: DC

## 2023-07-08 MED ORDER — INSULIN NPH (HUMAN) (ISOPHANE) 100 UNIT/ML ~~LOC~~ SUSP: 50.0000 [IU] | Freq: Every day | SUBCUTANEOUS | 11 refills | Status: DC

## 2023-07-08 MED ORDER — METFORMIN HCL ER 500 MG PO TB24: 500.0000 mg | ORAL_TABLET | Freq: Two times a day (BID) | ORAL | 3 refills | Status: DC

## 2023-07-08 MED ORDER — INSULIN SYRINGES (DISPOSABLE) U-100 1 ML MISC: 1.0000 | Freq: Four times a day (QID) | 3 refills | Status: AC

## 2023-07-08 NOTE — Progress Notes (Signed)
 Name: Cristina Burns  Age/ Sex: 63 y.o., female   MRN/ DOB: [id number], 1960/06/29     PCP: Jobe Mulder, DO   Reason for Endocrinology Evaluation: Type 2 Diabetes Mellitus  Initial Endocrine Consultative Visit: 05/08/2020    PATIENT IDENTIFIER: Cristina Burns is a 63 y.o. female with a past medical history of T2DM, RLS, Hypothyroidism and HTN. The patient has followed with Endocrinology clinic since 05/08/2020 for consultative assistance with management of her diabetes.  DIABETIC HISTORY:  Cristina Burns was diagnosed with DM in 2000, Trulicity - stomach issues. Invokana- recurrent yeast infections. Her hemoglobin A1c has ranged from  7.2%in 2021, peaking at 10.6% in 2018.   On her initial visit to our clinic her A1c 7.8%, she was on Glimepiride , MDI regimen and Ozempic . We stopped Glimepiride , continued Metfomrin,MDI regimen and decreased Ozempic  due to nausea    Invokana caused  yeast infection   She stopped Ozempic  it by her visit in 09/2020 She was trained on OmniPod use 09/2021 but was unable to sustain it due to cost issues  Started Mounjaro  02/2022  We decreased  metformin  by 50% due to low GFR    SUBJECTIVE:   During the last visit (03/04/2023): A1c 8.9 %    Today (07/08/2023): Cristina Burns  is here for a follow up on diabetes management. She checks her blood sugars 3 times daily .   She continues with abdominal pain and alternating bowel movements  due to IBS , discontinue Mounjaro  did not help Unfortunately, she lost her job hence she has no health insurance at this time she has enough Toujeo  and Humalog    HOME DIABETES REGIMEN:  Metformin  500 mg XR 1 tabs twice daily Toujeo  54 units BID  Humalog  15 units TIDQAC CF: Humalog  (BG-140/20)     Statin: yes ACE-I/ARB: yes Prior Diabetic Education: yes    CONTINUOUS GLUCOSE MONITORING RECORD INTERPRETATION    Dates of Recording: 5/24-06/26/2023  Sensor description: dexcom  Results statistics:   CGM  use % of time 94  Average and SD 318/70  Time in range 2%  % Time Above 180 16  % Time above 250 82  % Time Below target 0   Glycemic patterns summary: BGs are high throughout the day and night  Hyperglycemic episodes  postprandial   Hypoglycemic episodes occurred n/a  Overnight periods: high   DIABETIC COMPLICATIONS: Microvascular complications:  Neuropathy Denies: CKD, retinopathy Last Eye Exam: Completed 2023  Macrovascular complications:   Denies: CAD, CVA, PVD   HISTORY:  Past Medical History:  Past Medical History:  Diagnosis Date   ADHD (attention deficit hyperactivity disorder)    Allergic rhinitis    Allergy    Diabetes mellitus without complication (HCC)    Diabetic neuropathy (HCC)    GERD (gastroesophageal reflux disease)    Hypertension    Hypothyroidism    RLS (restless legs syndrome)    Stomach ulcer it's been a long time   Past Surgical History:  Past Surgical History:  Procedure Laterality Date   ABDOMINAL HYSTERECTOMY  2002   ACHILLES TENDON REPAIR  2009   CHOLECYSTECTOMY  1982   ROTATOR CUFF REPAIR Right 2011   Social History:  reports that she quit smoking about 41 years ago. Her smoking use included cigarettes. She started smoking about 50 years ago. She has a 18 pack-year smoking history. She has never used smokeless tobacco. She reports that she does not drink alcohol and does not use drugs. Family History:  Family History  Problem Relation Age of Onset   Heart disease Father    Heart attack Father    Heart disease Brother        CABG   Diabetes Brother    Kidney disease Brother    Cirrhosis Brother    Hypertension Brother    Heart attack Brother    Heart disease Brother    Diabetes Brother    Heart attack Brother    Diabetes Mother    Breast cancer Mother    Stroke Mother      HOME MEDICATIONS: Allergies as of 07/08/2023       Reactions   Codeine Hives   Darvon [propoxyphene] Hives   Darvocet   Doxycycline      Double Vision   Erythromycin    Stomach pain   Keflex [cephalexin] Hives        Medication List        Accurate as of July 08, 2023 11:28 AM. If you have any questions, ask your nurse or doctor.          albuterol  108 (90 Base) MCG/ACT inhaler Commonly known as: VENTOLIN  HFA Inhale 1-2 puffs into the lungs every 6 (six) hours as needed for wheezing or shortness of breath.   amLODipine  5 MG tablet Commonly known as: NORVASC  Take 1 tablet (5 mg total) by mouth daily.   atorvastatin  40 MG tablet Commonly known as: LIPITOR Take 1 tablet (40 mg total) by mouth daily.   Azelastine  HCl 137 MCG/SPRAY Soln Place 1 spray into both nostrils in the morning and at bedtime.   Dexcom G7 Sensor Misc USE AS DIRECTED EVERY  10  DAYS   dicyclomine  10 MG capsule Commonly known as: BENTYL  Take 1 capsule (10 mg total) by mouth every 6 (six) hours as needed for spasms.   famotidine  20 MG tablet Commonly known as: PEPCID  Take 1 tablet (20 mg total) by mouth 2 (two) times daily.   furosemide  40 MG tablet Commonly known as: LASIX  Take 1 tablet by mouth twice daily   gabapentin  600 MG tablet Commonly known as: NEURONTIN  Take 1 tab in the afternoon. Take 2 tabs in the evening.   glucose blood test strip 1 each by Other route daily in the afternoon. Use as instructed   insulin  lispro 100 UNIT/ML KwikPen Commonly known as: HumaLOG  KwikPen 75 units max daily dose.   HumaLOG  KwikPen 200 UNIT/ML KwikPen Generic drug: insulin  lispro Max daily 80 units   Insulin  Pen Needle 32G X 4 MM Misc 1 Device by Does not apply route in the morning, at noon, in the evening, and at bedtime.   levocetirizine 5 MG tablet Commonly known as: XYZAL  Take 1 tablet (5 mg total) by mouth every evening.   levothyroxine  125 MCG tablet Commonly known as: SYNTHROID  Take 1 tablet (125 mcg total) by mouth daily.   losartan  50 MG tablet Commonly known as: COZAAR  Take 1 tablet (50 mg total) by mouth  daily.   metFORMIN  500 MG 24 hr tablet Commonly known as: GLUCOPHAGE -XR Take 1 tablet (500 mg total) by mouth 2 (two) times daily with a meal.   methylphenidate  20 MG tablet Commonly known as: Ritalin  Daily after lunch.   methylphenidate  20 MG 24 hr capsule Commonly known as: RITALIN  LA Take 1 capsule (20 mg total) by mouth every morning.   methylphenidate  20 MG tablet Commonly known as: RITALIN  Take daily after lunch.   methylphenidate  20 MG 24 hr capsule Commonly known as: RITALIN   LA Take 1 capsule (20 mg total) by mouth every morning.   methylphenidate  20 MG tablet Commonly known as: RITALIN  Take 1 tab in the afternoon daily.   methylphenidate  20 MG 24 hr capsule Commonly known as: RITALIN  LA Take 1 capsule (20 mg total) by mouth every morning.   montelukast  10 MG tablet Commonly known as: SINGULAIR  Take 1 tablet (10 mg total) by mouth at bedtime.   multivitamin-iron-minerals-folic acid chewable tablet Chew 1 tablet by mouth daily.   Omnipod 5 G7 Pods (Gen 5) Misc 1 Device by Does not apply route every other day.   Omnipod 5 G7 Intro (Gen 5) Kit 1 Device by Does not apply route every other day.   pantoprazole  40 MG tablet Commonly known as: PROTONIX  Take 1 tablet (40 mg total) by mouth 2 (two) times daily before a meal.   ropinirole  5 MG tablet Commonly known as: REQUIP  Take 5 mg nightly in addition to 2 mg in the afternoon.   rOPINIRole  2 MG tablet Commonly known as: REQUIP  TAKE 1 TABLET BY MOUTH ONCE DAILY IN THE MORNING AND 2 AT BEDTIME   Toujeo  SoloStar 300 UNIT/ML Solostar Pen Generic drug: insulin  glargine (1 Unit Dial ) Inject 52 Units into the skin in the morning and at bedtime. What changed: how much to take   triamcinolone  55 MCG/ACT Aero nasal inhaler Commonly known as: Nasacort  Allergy 24HR Place 2 sprays into the nose daily.   triamterene -hydrochlorothiazide 37.5-25 MG tablet Commonly known as: MAXZIDE-25 Take 1 tablet by mouth once  daily         OBJECTIVE:   Vital Signs: BP 130/70 (BP Location: Left Arm, Patient Position: Sitting, Cuff Size: Normal)   Pulse 74   Ht 5' 2 (1.575 m)   Wt 219 lb (99.3 kg)   SpO2 97%   BMI 40.06 kg/m   Wt Readings from Last 3 Encounters:  07/08/23 219 lb (99.3 kg)  03/04/23 214 lb (97.1 kg)  02/04/23 213 lb (96.6 kg)     Exam: General: Pt appears well and is in NAD  Lungs: Clear with good BS bilat   Heart: RRR   Extremities: 1+ pretibial edema.   Neuro: MS is good with appropriate affect, pt is alert and Ox3    DM foot exam: 03/04/2023  The skin of the feet is intact without sores or ulcerations. The pedal pulses are 2+ on right and 2+ on left. The sensation is intact to a screening 5.07, 10 gram monofilament bilaterally        DATA REVIEWED:  Lab Results  Component Value Date   HGBA1C 11.5 (A) 07/08/2023   HGBA1C 8.9 (A) 03/05/2023   HGBA1C 6.7 (A) 08/29/2022     Latest Reference Range & Units 12/31/22 15:58  Sodium 135 - 145 mEq/L 144  Potassium 3.5 - 5.1 mEq/L 4.4  Chloride 96 - 112 mEq/L 104  CO2 19 - 32 mEq/L 29  Glucose 70 - 99 mg/dL 161 (H)  BUN 6 - 23 mg/dL 28 (H)  Creatinine 0.96 - 1.20 mg/dL 0.45  Calcium  8.4 - 10.5 mg/dL 9.9  Alkaline Phosphatase 39 - 117 U/L 118 (H)  Albumin 3.5 - 5.2 g/dL 4.3  AST 0 - 37 U/L 25  ALT 0 - 35 U/L 23  Total Protein 6.0 - 8.3 g/dL 7.3  Total Bilirubin 0.2 - 1.2 mg/dL 0.4  GFR >40.98 mL/min 51.63 (L)  (H): Data is abnormally high (L): Data is abnormally low   ASSESSMENT / PLAN /  RECOMMENDATIONS:   1) Type 2 Diabetes Mellitus, Optimally controlled, With Neuropathic  complications - Most recent A1c of 11.5%. Goal A1c < 7.0 %.     -Patient with worsening glycemic control -She has lost health insurance, continues to have Toujeo  and Humalog  left, she has not been able to get the Dexcom, but has been using fingerstick to check glucose.  I have reviewed glucose data on the phone with BG's consistently >250  mg/dL  - Intolerant to Ozempic  0.25 mg dose  -She is intolerant to Invokana due to recurrent yeast infections, she is scared of trying other alternatives of SGLT2 inhibitors. -She was trained on OmniPod but unfortunately it was cost prohibitive, tandem is cost prohibitive as well.   -I had to decrease her metformin  due to a low GFR <45  - She was having GI issues while on Mounjaro  2.5 mg, discontinuing it did not improve her GI symptoms. - She has lost health insurance, we will switch to NPH and regular as below - We also entertain the idea of patient assistance if needed for insulin   MEDICATIONS:   Continue metformin  500 mg XR 1 tabs twice daily Take NPH 50 units at bedtime Take regular insulin  20 units 3 times daily before every meal Continue correction factor: Regular insulin  (BG -140/20) before each meal   EDUCATION / INSTRUCTIONS: BG monitoring instructions: Patient is instructed to check her blood sugars 3 times a day, before meals. Call [facility] if: BG persistently < 70  I reviewed the Rule of 15 for the treatment of hypoglycemia in detail with the patient. Literature supplied.    2) Diabetic complications:  Eye: Does not  have known diabetic retinopathy.  Neuro/ Feet: Does  have known diabetic peripheral neuropathy .  Renal: Patient does not have known baseline CKD. She   is  on an ACEI/ARB at present.    3) Hypothyroidism:  -Patient is clinically euthyroid -TSH is normal 08/2022 - We will not perform labs due to lack of health insurance, will recheck labs once she has new health insurance   Medication  Continue levothyroxine  125 mcg daily    F/U in 3 months     Signed electronically by: Natale Bail, MD  [facility]  [facility] [address] [place] [place], [place] [address] Phone: [phone] FAX: [phone]   CC: Jobe Mulder, DO [address] [place]  [address] Phone: [phone]  Fax: [phone]  Return to Endocrinology clinic as below: Future Appointments  Date Time Provider Department Center  07/08/2023  3:20 PM Lory Galan, Julian Obey, MD [id number] None

## 2023-07-08 NOTE — Patient Instructions (Signed)
-   Continue Metformin  500 mg 1 tabs Twice a day  - Take Reilon N (NPH) 50 units at bedtime only  - Take Relion-R ( regular ) 20 units with each meal (preferably 20 minutes before the meal) - Regular insulin  correctional insulin : ADD extra units on insulin  to your meal-time Humalog   dose if your blood sugars are higher than 150. Use the scale below to help guide you:   Blood sugar before meal Number of units to inject  Less than 150 0 unit  151- 170 1 units  171 - 190 2 units  191 - 210 3 units  211 - 230 4 units  231 - 250 5 units  251 - 270 6 units  271 - 290 7 units   291 - 310  8 units   311 - 330 9 units       HOW TO TREAT LOW BLOOD SUGARS (Blood sugar LESS THAN 70 MG/DL) Please follow the RULE OF 15 for the treatment of hypoglycemia treatment (when your (blood sugars are less than 70 mg/dL)   STEP 1: Take 15 grams of carbohydrates when your blood sugar is low, which includes:  3-4 GLUCOSE TABS  OR 3-4 OZ OF JUICE OR REGULAR SODA OR ONE TUBE OF GLUCOSE GEL    STEP 2: RECHECK blood sugar in 15 MINUTES STEP 3: If your blood sugar is still low at the 15 minute recheck --> then, go back to STEP 1 and treat AGAIN with another 15 grams of carbohydrates.

## 2023-07-09 ENCOUNTER — Other Ambulatory Visit: Payer: Self-pay | Admitting: Family Medicine

## 2023-07-09 DIAGNOSIS — I10 Essential (primary) hypertension: Secondary | ICD-10-CM

## 2023-07-10 ENCOUNTER — Other Ambulatory Visit: Payer: Self-pay | Admitting: Family Medicine

## 2023-07-10 DIAGNOSIS — E1165 Type 2 diabetes mellitus with hyperglycemia: Secondary | ICD-10-CM

## 2023-08-05 ENCOUNTER — Other Ambulatory Visit: Payer: Self-pay | Admitting: Internal Medicine

## 2023-08-07 ENCOUNTER — Encounter: Payer: Self-pay | Admitting: Internal Medicine

## 2023-08-10 ENCOUNTER — Other Ambulatory Visit: Payer: Self-pay | Admitting: Family Medicine

## 2023-08-10 DIAGNOSIS — G2581 Restless legs syndrome: Secondary | ICD-10-CM

## 2023-08-10 MED ORDER — INSULIN LISPRO (1 UNIT DIAL) 100 UNIT/ML (KWIKPEN): 20.0000 [IU] | PEN_INJECTOR | Freq: Three times a day (TID) | SUBCUTANEOUS | 11 refills | Status: DC

## 2023-08-29 ENCOUNTER — Other Ambulatory Visit: Payer: Self-pay | Admitting: Family Medicine

## 2023-08-29 DIAGNOSIS — Z794 Long term (current) use of insulin: Secondary | ICD-10-CM

## 2023-08-30 IMAGING — DX DG CHEST 1V PORT
1 series · 1 of 1 positions shown · non-contrast
Comparison: None.

CLINICAL DATA: Chest pain

EXAM:
PORTABLE CHEST 1 VIEW

[chest ap]
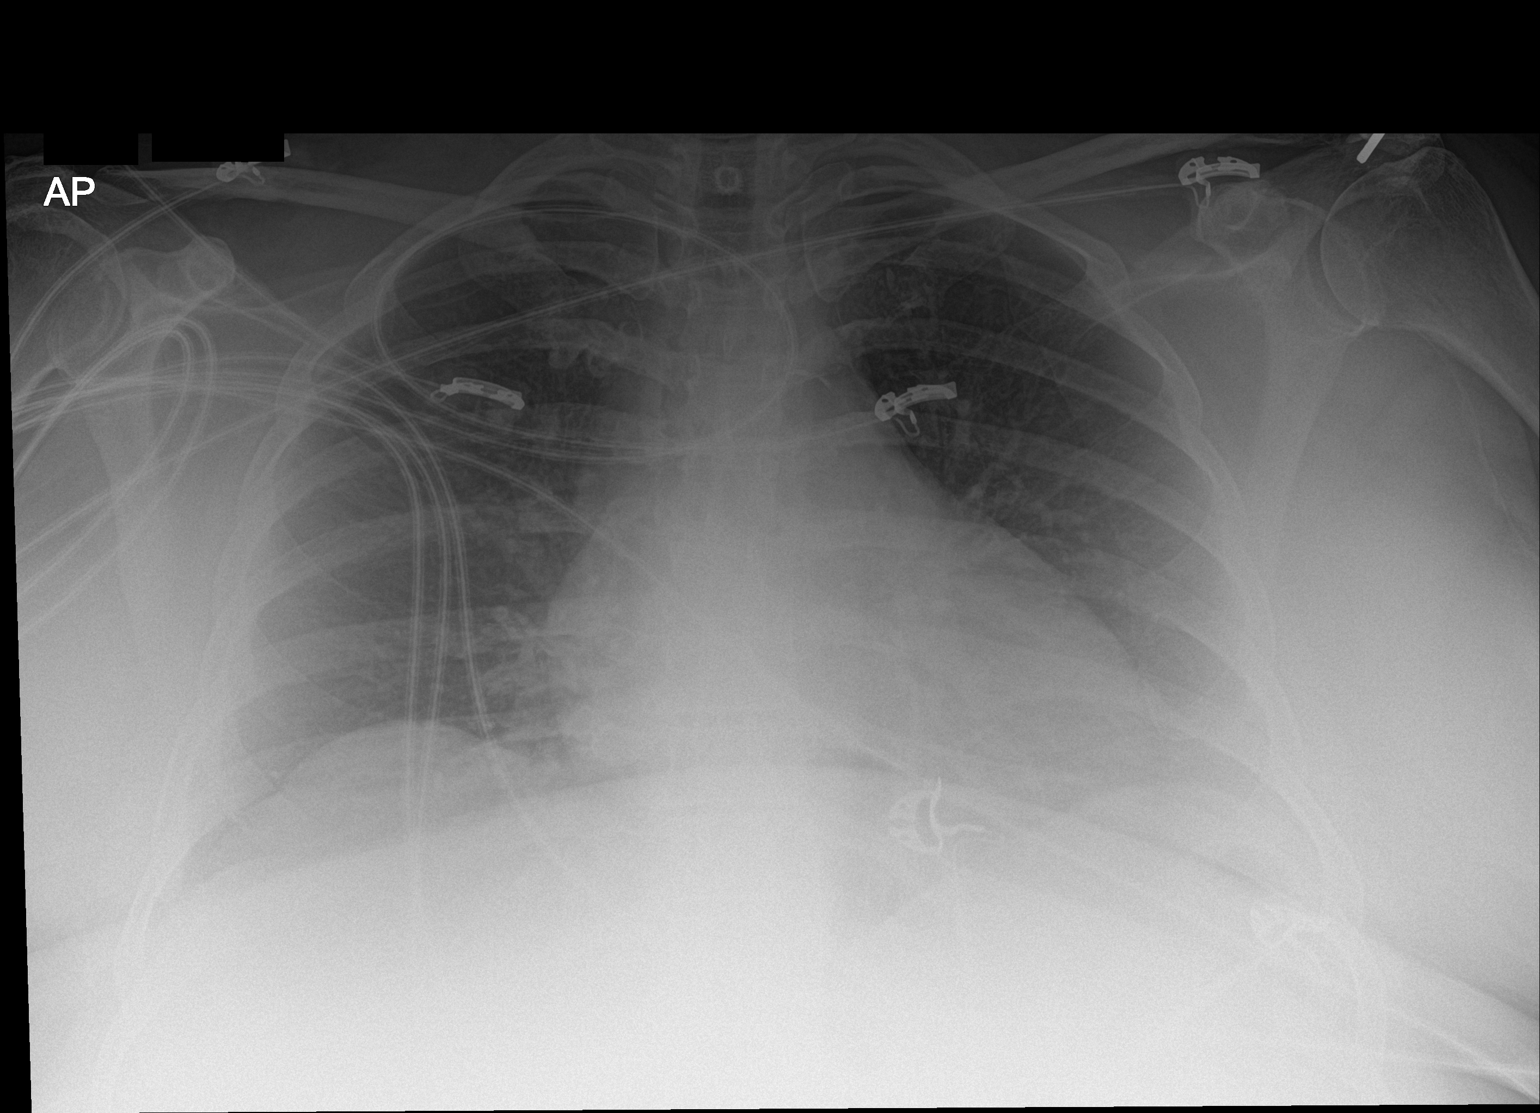

[1 of 1 positions shown; findings below may reference images not displayed]

FINDINGS: Heart and mediastinal contours are within normal limits. No focal
opacities or effusions. No acute bony abnormality.
IMPRESSION: No active disease.

## 2023-09-08 ENCOUNTER — Other Ambulatory Visit: Payer: Self-pay | Admitting: Family Medicine

## 2023-09-08 DIAGNOSIS — L299 Pruritus, unspecified: Secondary | ICD-10-CM

## 2023-09-16 ENCOUNTER — Other Ambulatory Visit: Payer: Self-pay | Admitting: Family Medicine

## 2023-09-16 DIAGNOSIS — G2581 Restless legs syndrome: Secondary | ICD-10-CM

## 2023-09-24 ENCOUNTER — Telehealth: Payer: Self-pay

## 2023-09-24 NOTE — Telephone Encounter (Signed)
 Copied from CRM [id number]. Topic: Clinical - Order For Equipment >> Sep 24, 2023 11:06 AM Anairis L wrote: Reason for CRM: Diane is calling regarding a Order request form sent from [employer] on 09/07/2023. She needs the provider to sign and date it.   [employer] Pt Ass program [phone] [phone]  Requested for her to re-fax.   Thanks.

## 2023-09-28 NOTE — Telephone Encounter (Signed)
 Sent pt message letting her know we have gotten anything.

## 2023-10-14 ENCOUNTER — Ambulatory Visit (INDEPENDENT_AMBULATORY_CARE_PROVIDER_SITE_OTHER): Payer: Self-pay | Admitting: Internal Medicine

## 2023-10-14 ENCOUNTER — Encounter: Payer: Self-pay | Admitting: Internal Medicine

## 2023-10-14 VITALS — BP 128/74 | HR 86 | Ht 62.0 in | Wt 227.0 lb

## 2023-10-14 DIAGNOSIS — E1165 Type 2 diabetes mellitus with hyperglycemia: Secondary | ICD-10-CM

## 2023-10-14 DIAGNOSIS — Z794 Long term (current) use of insulin: Secondary | ICD-10-CM

## 2023-10-14 DIAGNOSIS — N760 Acute vaginitis: Secondary | ICD-10-CM

## 2023-10-14 DIAGNOSIS — E039 Hypothyroidism, unspecified: Secondary | ICD-10-CM

## 2023-10-14 LAB — POCT GLYCOSYLATED HEMOGLOBIN (HGB A1C): Hemoglobin A1C: 11.6 % — AB (ref 4.0–5.6)

## 2023-10-14 MED ORDER — FLUCONAZOLE 150 MG PO TABS: 150.0000 mg | ORAL_TABLET | Freq: Once | ORAL | 0 refills | Status: AC

## 2023-10-14 MED ORDER — TOUJEO MAX SOLOSTAR 300 UNIT/ML ~~LOC~~ SOPN
65.0000 [IU] | PEN_INJECTOR | Freq: Two times a day (BID) | SUBCUTANEOUS | 3 refills | Status: DC
  Filled 2023-12-31: qty 30, 69d supply, fill #0

## 2023-10-14 MED ORDER — INSULIN LISPRO (1 UNIT DIAL) 100 UNIT/ML (KWIKPEN): 24.0000 [IU] | PEN_INJECTOR | Freq: Three times a day (TID) | SUBCUTANEOUS | 11 refills | Status: AC

## 2023-10-14 MED ORDER — METFORMIN HCL ER 500 MG PO TB24
500.0000 mg | ORAL_TABLET | Freq: Two times a day (BID) | ORAL | 3 refills | Status: AC
Start: 2023-10-14 — End: ?

## 2023-10-14 NOTE — Patient Instructions (Addendum)
-   Continue Metformin  500 mg 1 tabs Twice a day  - Toujeo  65 units twice daily  - Humalog  25 units with each meal  - Humalog   correctional insulin : ADD extra units on insulin  to your meal-time Humalog   dose if your blood sugars are higher than 150. Use the scale below to help guide you:   Blood sugar before meal Number of units to inject  Less than 135 0 unit  136 - 150 1 units  151 - 165 2 units  166 - 180 3 units  181 - 195 4 units  196 - 210 5 units  211 - 225 6 units  226 - 240 7 units   241 - 255 8 units   256 - 270 9 units   271 - 285 10 units  286 - 300 11 units   301 - 315 12 units      HOW TO TREAT LOW BLOOD SUGARS (Blood sugar LESS THAN 70 MG/DL) Please follow the RULE OF 15 for the treatment of hypoglycemia treatment (when your (blood sugars are less than 70 mg/dL)   STEP 1: Take 15 grams of carbohydrates when your blood sugar is low, which includes:  3-4 GLUCOSE TABS  OR 3-4 OZ OF JUICE OR REGULAR SODA OR ONE TUBE OF GLUCOSE GEL    STEP 2: RECHECK blood sugar in 15 MINUTES STEP 3: If your blood sugar is still low at the 15 minute recheck --> then, go back to STEP 1 and treat AGAIN with another 15 grams of carbohydrates.

## 2023-10-14 NOTE — Progress Notes (Signed)
 Name: ITXEL WICKARD  Age/ Sex: 63 y.o., female   MRN/ DOB: [id number], April 30, 1960     PCP: Frann Mabel Mt, DO   Reason for Endocrinology Evaluation: Type 2 Diabetes Mellitus  Initial Endocrine Consultative Visit: 05/08/2020    PATIENT IDENTIFIER: [name] is a 63 y.o. female with a past medical history of T2DM, RLS, Hypothyroidism and HTN. The patient has followed with Endocrinology clinic since 05/08/2020 for consultative assistance with management of her diabetes.    DIABETIC HISTORY:  Cristina Burns was diagnosed with DM in 2000, Trulicity - stomach issues. Invokana- recurrent yeast infections. Her hemoglobin A1c has ranged from  7.2%in 2021, peaking at 10.6% in 2018.   On her initial visit to our clinic her A1c 7.8%, she was on Glimepiride , MDI regimen and Ozempic . We stopped Glimepiride , continued Metfomrin,MDI regimen and decreased Ozempic  due to nausea    Invokana caused  yeast infection   She stopped Ozempic  it by her visit in 09/2020 She was trained on OmniPod use 09/2021 but was unable to sustain it due to cost issues  Started Mounjaro  02/2022 but this was discontinued due to abdominal pain  We decreased  metformin  by 50% due to low GFR    SUBJECTIVE:   During the last visit (07/08/2023): A1c 11.5%    Today (10/14/2023): Cristina Burns  is here for a follow up on diabetes management. She checks her blood sugars 3 times daily .   Patient uses [employer] operational officer, that does not show me times or the dates of glucose checks just the value  She continues with abdominal pain and alternating bowel movements  due to IBS  Denies nausea or vomiting  Has LE edema  She has noted vaginal irritation , took oTC 2 weeks ago without help    HOME DIABETES REGIMEN:  Metformin  500 mg XR 1 tabs twice daily Toujeo  55 units-daily - has been taking  BID Humalog   20 units TIDQAC- has been taking 18 units  CF: Humalog    (BG-140/20)     Statin: yes ACE-I/ARB:  yes Prior Diabetic Education: yes   GLUCOSE METER : Unable to download 187- Hi  DIABETIC COMPLICATIONS: Microvascular complications:  Neuropathy Denies: CKD, retinopathy Last Eye Exam: Completed 2023  Macrovascular complications:   Denies: CAD, CVA, PVD   HISTORY:  Past Medical History:  Past Medical History:  Diagnosis Date   ADHD (attention deficit hyperactivity disorder)    Allergic rhinitis    Allergy    Diabetes mellitus without complication (HCC)    Diabetic neuropathy (HCC)    GERD (gastroesophageal reflux disease)    Hypertension    Hypothyroidism    RLS (restless legs syndrome)    Stomach ulcer it's been a long time   Past Surgical History:  Past Surgical History:  Procedure Laterality Date   ABDOMINAL HYSTERECTOMY  2002   ACHILLES TENDON REPAIR  2009   CHOLECYSTECTOMY  1982   ROTATOR CUFF REPAIR Right 2011   Social History:  reports that she quit smoking about 41 years ago. Her smoking use included cigarettes. She started smoking about 50 years ago. She has a 18 pack-year smoking history. She has never used smokeless tobacco. She reports that she does not drink alcohol and does not use drugs. Family History:  Family History  Problem Relation Age of Onset   Heart disease Father    Heart attack Father    Heart disease Brother        CABG   Diabetes  Brother    Kidney disease Brother    Cirrhosis Brother    Hypertension Brother    Heart attack Brother    Heart disease Brother    Diabetes Brother    Heart attack Brother    Diabetes Mother    Breast cancer Mother    Stroke Mother      HOME MEDICATIONS: Allergies as of 10/14/2023       Reactions   Codeine Hives   Darvon [propoxyphene] Hives   Darvocet   Doxycycline     Double Vision   Erythromycin    Stomach pain   Keflex [cephalexin] Hives        Medication List        Accurate as of October 14, 2023 12:49 PM. If you have any questions, ask your nurse or doctor.           STOP taking these medications    insulin  NPH Human 100 UNIT/ML injection Commonly known as: NovoLIN  N [employer] Stopped by: Remington Highbaugh J Sherman Donaldson       TAKE these medications    albuterol  108 (90 Base) MCG/ACT inhaler Commonly known as: VENTOLIN  HFA Inhale 1-2 puffs into the lungs every 6 (six) hours as needed for wheezing or shortness of breath.   amLODipine  5 MG tablet Commonly known as: NORVASC  Take 1 tablet (5 mg total) by mouth daily.   atorvastatin  40 MG tablet Commonly known as: LIPITOR Take 1 tablet (40 mg total) by mouth daily.   Azelastine  HCl 137 MCG/SPRAY Soln Place 1 spray into both nostrils in the morning and at bedtime.   Dexcom G7 Sensor Misc USE AS DIRECTED EVERY  10  DAYS   dicyclomine  10 MG capsule Commonly known as: BENTYL  Take 1 capsule (10 mg total) by mouth every 6 (six) hours as needed for spasms.   famotidine  20 MG tablet Commonly known as: PEPCID  Take 1 tablet by mouth twice daily   fluconazole  150 MG tablet Commonly known as: DIFLUCAN  Take 1 tablet (150 mg total) by mouth once for 1 dose. Started by: Mecca Barga J Katalina Magri   furosemide  40 MG tablet Commonly known as: LASIX  Take 1 tablet by mouth twice daily   gabapentin  600 MG tablet Commonly known as: NEURONTIN  TAKE 1 TABLET BY MOUTH THREE TIMES DAILY   glucose blood test strip 1 each by Other route daily in the afternoon. Use as instructed   insulin  lispro 100 UNIT/ML KwikPen Commonly known as: HumaLOG  KwikPen Inject 24-34 Units into the skin 3 (three) times daily. What changed: how much to take Changed by: Vana Arif J Kyliegh Jester   Insulin  Pen Needle 32G X 4 MM Misc 1 Device by Does not apply route in the morning, at noon, in the evening, and at bedtime.   Insulin  Syringes (Disposable) U-100 1 ML Misc 1 Device by Does not apply route in the morning, at noon, in the evening, and at bedtime.   levocetirizine 5 MG tablet Commonly known as: XYZAL  TAKE 1 TABLET BY MOUTH ONCE DAILY  IN THE EVENING   levothyroxine  125 MCG tablet Commonly known as: SYNTHROID  Take 1 tablet (125 mcg total) by mouth daily.   losartan  50 MG tablet Commonly known as: COZAAR  Take 1 tablet (50 mg total) by mouth daily.   metFORMIN  500 MG 24 hr tablet Commonly known as: GLUCOPHAGE -XR Take 1 tablet (500 mg total) by mouth 2 (two) times daily with a meal.   methylphenidate  20 MG tablet Commonly known as: Ritalin  Daily after lunch.   methylphenidate   20 MG 24 hr capsule Commonly known as: RITALIN  LA Take 1 capsule (20 mg total) by mouth every morning.   methylphenidate  20 MG tablet Commonly known as: RITALIN  Take daily after lunch.   methylphenidate  20 MG 24 hr capsule Commonly known as: RITALIN  LA Take 1 capsule (20 mg total) by mouth every morning.   methylphenidate  20 MG tablet Commonly known as: RITALIN  Take 1 tab in the afternoon daily.   methylphenidate  20 MG 24 hr capsule Commonly known as: RITALIN  LA Take 1 capsule (20 mg total) by mouth every morning.   montelukast  10 MG tablet Commonly known as: SINGULAIR  TAKE 1 TABLET BY MOUTH AT BEDTIME   multivitamin-iron-minerals-folic acid chewable tablet Chew 1 tablet by mouth daily.   pantoprazole  40 MG tablet Commonly known as: PROTONIX  Take 1 tablet (40 mg total) by mouth 2 (two) times daily before a meal.   rOPINIRole  2 MG tablet Commonly known as: REQUIP  TAKE 1 TABLET BY MOUTH ONCE DAILY IN THE MORNING AND 2 AT BEDTIME   ropinirole  5 MG tablet Commonly known as: REQUIP  TAKE 1 TABLET BY MOUTH NIGHTLY IN  ADDITION  TO  THE  2MG   IN  THE  AFTERNOON   Toujeo  Max SoloStar 300 UNIT/ML Solostar Pen Generic drug: insulin  glargine (2 Unit Dial ) Inject 65 Units into the skin in the morning and at bedtime. Started by: Jeniece Hannis J Kayler Buckholtz   triamcinolone  55 MCG/ACT Aero nasal inhaler Commonly known as: Nasacort  Allergy 24HR Place 2 sprays into the nose daily.   triamterene -hydrochlorothiazide 37.5-25 MG  tablet Commonly known as: MAXZIDE-25 Take 1 tablet by mouth once daily         OBJECTIVE:   Vital Signs: BP 128/74 (BP Location: Left Arm, Patient Position: Sitting, Cuff Size: Large)   Pulse 86   Ht 5' 2 (1.575 m)   Wt 227 lb (103 kg)   SpO2 97%   BMI 41.52 kg/m   Wt Readings from Last 3 Encounters:  10/14/23 227 lb (103 kg)  07/08/23 219 lb (99.3 kg)  03/04/23 214 lb (97.1 kg)     Exam: General: Pt appears well and is in NAD  Lungs: Clear with good BS bilat   Heart: RRR   Extremities: 1+ pretibial edema.   Neuro: MS is good with appropriate affect, pt is alert and Ox3    DM foot exam: 03/04/2023  The skin of the feet is intact without sores or ulcerations. The pedal pulses are 2+ on right and 2+ on left. The sensation is intact to a screening 5.07, 10 gram monofilament bilaterally        DATA REVIEWED:  Lab Results  Component Value Date   HGBA1C 11.6 (A) 10/14/2023   HGBA1C 11.5 (A) 07/08/2023   HGBA1C 8.9 (A) 03/05/2023     Latest Reference Range & Units 12/31/22 15:58  Sodium 135 - 145 mEq/L 144  Potassium 3.5 - 5.1 mEq/L 4.4  Chloride 96 - 112 mEq/L 104  CO2 19 - 32 mEq/L 29  Glucose 70 - 99 mg/dL 837 (H)  BUN 6 - 23 mg/dL 28 (H)  Creatinine 9.59 - 1.20 mg/dL 8.85  Calcium  8.4 - 10.5 mg/dL 9.9  Alkaline Phosphatase 39 - 117 U/L 118 (H)  Albumin 3.5 - 5.2 g/dL 4.3  AST 0 - 37 U/L 25  ALT 0 - 35 U/L 23  Total Protein 6.0 - 8.3 g/dL 7.3  Total Bilirubin 0.2 - 1.2 mg/dL 0.4  GFR >39.99 mL/min 51.63 (L)  (H): Data  is abnormally high (L): Data is abnormally low   ASSESSMENT / PLAN / RECOMMENDATIONS:   1) Type 2 Diabetes Mellitus, Optimally controlled, With Neuropathic  complications - Most recent A1c of 11.5%. Goal A1c < 7.0 %.    -Patient continues with poorly controlled DM, despite self increasing Toujeo  to twice daily -Patient has no health insurance, and I attempted to prescribe Novolin -N and Novolin - R, but the patient is using co-pay  cards and is able to get this at $35 pen - Intolerant to Ozempic  0.25 mg dose , and Mounjaro  2.5 mg  -She is intolerant to Invokana due to recurrent yeast infections, she is scared of trying other alternatives of SGLT2 inhibitors. -She was trained on OmniPod but unfortunately it was cost prohibitive, tandem is cost prohibitive as well.   -I had to decrease her metformin  due to a low GFR <45  -Patient was provided with patient assistance forms for Humulin-U500 . - If she gets approved, I will switch Toujeo /Humalog  to Humulin-500  - Once she gets health insurance, I will screen her for Cushing syndrome, as she is very resistant to insulin  - Will make the following adjustment to her insulin  regimen - Emphasized the importance of taking prandial insulin  with a meal rather than after the meal  MEDICATIONS:   Continue metformin  500 mg XR 1 tabs twice daily Increase Toujeo  65 units twice daily Increase Humalog  25 units 3 times daily before every meal Change  correction factor: Regular insulin  (BG -140/15) before each meal   EDUCATION / INSTRUCTIONS: BG monitoring instructions: Patient is instructed to check her blood sugars 3 times a day, before meals. Call [facility] if: BG persistently < 70  I reviewed the Rule of 15 for the treatment of hypoglycemia in detail with the patient. Literature supplied.    2) Diabetic complications:  Eye: Does not  have known diabetic retinopathy.  Neuro/ Feet: Does  have known diabetic peripheral neuropathy .  Renal: Patient does not have known baseline CKD. She   is  on an ACEI/ARB at present.    3) Hypothyroidism:  -Patient is clinically euthyroid -TSH has been normal  - We will not perform labs due to lack of health insurance, will recheck labs once she has new health insurance   Medication  Continue levothyroxine  125 mcg daily  4) Acute Vaginitis:  - A prescription for Diflucan  150 mg x 1 was sent to the pharmacy -  Encourage optimizing glucose control - I have also asked the patient to either start taking probiotics or a cup of plain yogurt daily  F/U in 3 months     Signed electronically by: Stefano Redgie Butts, MD  [facility]  [facility] [address] [place] [place], [place] [address] Phone: [phone] FAX: [phone]   CC: Frann Mabel Mt, DO [address] [place] [address] Phone: [phone]  Fax: [phone]  Return to Endocrinology clinic as below: Future Appointments  Date Time Provider Department Center  01/20/2024  1:00 PM Thelia Tanksley, Donell Redgie, MD [id number] None

## 2023-10-19 ENCOUNTER — Emergency Department (HOSPITAL_BASED_OUTPATIENT_CLINIC_OR_DEPARTMENT_OTHER)
Admission: EM | Admit: 2023-10-19 | Discharge: 2023-10-19 | Disposition: A | Discharge status: Home / Self Care | Payer: Self-pay

## 2023-10-19 ENCOUNTER — Encounter (HOSPITAL_BASED_OUTPATIENT_CLINIC_OR_DEPARTMENT_OTHER): Payer: Self-pay

## 2023-10-19 ENCOUNTER — Other Ambulatory Visit: Payer: Self-pay

## 2023-10-19 ENCOUNTER — Emergency Department (HOSPITAL_BASED_OUTPATIENT_CLINIC_OR_DEPARTMENT_OTHER): Payer: Self-pay

## 2023-10-19 DIAGNOSIS — M7732 Calcaneal spur, left foot: Secondary | ICD-10-CM | POA: Insufficient documentation

## 2023-10-19 MED ORDER — LIDOCAINE 5 % EX PTCH: 1.0000 | MEDICATED_PATCH | CUTANEOUS | 0 refills | Status: AC

## 2023-10-19 NOTE — Discharge Instructions (Signed)
 It was a pleasure taking care of you here today  As we discussed-I have written for some lidocaine patches.  Make sure to follow-up with orthopedics or podiatry.  Number is listed in your discharge paperwork  Make sure to follow-up outpatient, return for any worsening symptoms

## 2023-10-19 NOTE — ED Provider Notes (Signed)
 [place] EMERGENCY DEPARTMENT AT [facility] [place] Provider Note   CSN: [id number] Arrival date & time: 10/19/23  1730     Patient presents with: Foot Pain   Cristina Burns is Burns 63 y.o. female here for evaluation of left foot pain.  Patient states she has had plantar left foot pain over the last 4 weeks.  She denies any recent falls or injuries.  Some chronic bilateral lower extremity swelling which is unchanged.  No numbness or weakness.  She has been using ibuprofen without relief.  Pain worse when she stands on the foot.  No fever, redness, warmth, rashes, lesions.  Pain will sometimes radiate up the left side of the foot but stems from the plantar aspect.  Has not been seen for this before.  No chest pain, shortness of breath.   HPI     Prior to Admission medications   Medication Sig Start Date End Date Taking? Authorizing Provider  lidocaine (LIDODERM) 5 % Place 1 patch onto the skin daily. Remove & Discard patch within 12 hours or as directed by MD 10/19/23  Yes Cristina Macera A, PA-C  albuterol  (VENTOLIN  HFA) 108 (90 Base) MCG/ACT inhaler Inhale 1-2 puffs into the lungs every 6 (six) hours as needed for wheezing or shortness of breath. 11/22/21   Frann Mabel Mt, DO  amLODipine  (NORVASC ) 5 MG tablet Take 1 tablet (5 mg total) by mouth daily. 07/09/23   Frann Mabel Mt, DO  atorvastatin  (LIPITOR) 40 MG tablet Take 1 tablet (40 mg total) by mouth daily. 03/12/23   Frann Mabel Mt, DO  Azelastine  HCl 137 MCG/SPRAY SOLN Place 1 spray into both nostrils in the morning and at bedtime. 05/14/23   Frann Mabel Mt, DO  Continuous Glucose Sensor (DEXCOM G7 SENSOR) MISC USE AS DIRECTED EVERY  10  DAYS 02/23/23   Shamleffer, Ibtehal Jaralla, MD  dicyclomine  (BENTYL ) 10 MG capsule Take 1 capsule (10 mg total) by mouth every 6 (six) hours as needed for spasms. 02/20/23   Frann Mabel Mt, DO  famotidine  (PEPCID ) 20 MG tablet Take 1 tablet by mouth twice  daily 09/08/23   Frann, Mabel Mt, DO  furosemide  (LASIX ) 40 MG tablet Take 1 tablet by mouth twice daily 08/16/21   Frann Mabel Mt, DO  gabapentin  (NEURONTIN ) 600 MG tablet TAKE 1 TABLET BY MOUTH THREE TIMES DAILY 08/29/23   Frann Mabel Mt, DO  glucose blood test strip 1 each by Other route daily in the afternoon. Use as instructed 03/04/23   Shamleffer, Ibtehal Jaralla, MD  insulin  glargine, 2 Unit Dial , (TOUJEO  MAX SOLOSTAR) 300 UNIT/ML Solostar Pen Inject 65 Units into the skin in the morning and at bedtime. 10/14/23   Shamleffer, Ibtehal Jaralla, MD  insulin  lispro (HUMALOG  KWIKPEN) 100 UNIT/ML KwikPen Inject 24-34 Units into the skin 3 (three) times daily. 10/14/23   Shamleffer, Ibtehal Jaralla, MD  Insulin  Pen Needle 32G X 4 MM MISC 1 Device by Does not apply route in the morning, at noon, in the evening, and at bedtime. 04/02/22   Shamleffer, Donell Cardinal, MD  Insulin  Syringes, Disposable, U-100 1 ML MISC 1 Device by Does not apply route in the morning, at noon, in the evening, and at bedtime. 07/08/23   Shamleffer, Ibtehal Jaralla, MD  levocetirizine (XYZAL ) 5 MG tablet TAKE 1 TABLET BY MOUTH ONCE DAILY IN THE EVENING 09/08/23   Wendling, Mabel Mt, DO  levothyroxine  (SYNTHROID ) 125 MCG tablet Take 1 tablet (125 mcg total) by mouth daily. 03/04/23  Shamleffer, Donell Cardinal, MD  losartan  (COZAAR ) 50 MG tablet Take 1 tablet (50 mg total) by mouth daily. 07/09/23   Frann Mabel Mt, DO  metFORMIN  (GLUCOPHAGE -XR) 500 MG 24 hr tablet Take 1 tablet (500 mg total) by mouth 2 (two) times daily with Burns meal. 10/14/23   Shamleffer, Donell Cardinal, MD  methylphenidate  (RITALIN  LA) 20 MG 24 hr capsule Take 1 capsule (20 mg total) by mouth every morning. 04/03/23   Frann Mabel Mt, DO  methylphenidate  (RITALIN  LA) 20 MG 24 hr capsule Take 1 capsule (20 mg total) by mouth every morning. 06/02/23   Frann Mabel Mt, DO  methylphenidate  (RITALIN  LA) 20 MG 24 hr  capsule Take 1 capsule (20 mg total) by mouth every morning. 05/03/23   Frann Mabel Mt, DO  methylphenidate  (RITALIN ) 20 MG tablet Daily after lunch. 04/03/23   Frann Mabel Mt, DO  methylphenidate  (RITALIN ) 20 MG tablet Take 1 tab in the afternoon daily. 06/02/23   Frann Mabel Mt, DO  methylphenidate  (RITALIN ) 20 MG tablet Take daily after lunch. 05/03/23   Frann Mabel Mt, DO  montelukast  (SINGULAIR ) 10 MG tablet TAKE 1 TABLET BY MOUTH AT BEDTIME 09/08/23   Wendling, Mabel Mt, DO  multivitamin-iron-minerals-folic acid (CENTRUM) chewable tablet Chew 1 tablet by mouth daily.    [provider]  pantoprazole  (PROTONIX ) 40 MG tablet Take 1 tablet (40 mg total) by mouth 2 (two) times daily before Burns meal. 07/02/23   Wendling, Mabel Mt, DO  rOPINIRole  (REQUIP ) 2 MG tablet TAKE 1 TABLET BY MOUTH ONCE DAILY IN THE MORNING AND 2 AT BEDTIME 08/11/23   Wendling, Mabel Mt, DO  ropinirole  (REQUIP ) 5 MG tablet TAKE 1 TABLET BY MOUTH NIGHTLY IN  ADDITION  TO  THE  2MG   IN  THE  AFTERNOON 09/16/23   Frann Mabel Mt, DO  triamcinolone  (NASACORT  ALLERGY 24HR) 55 MCG/ACT AERO nasal inhaler Place 2 sprays into the nose daily. 09/10/17   Frann Mabel Mt, DO  triamterene -hydrochlorothiazide (MAXZIDE-25) 37.5-25 MG tablet Take 1 tablet by mouth once daily 06/09/23   Frann Mabel Mt, DO    Allergies: Codeine, Darvon [propoxyphene], Doxycycline , Erythromycin, and Keflex [cephalexin]    Review of Systems  Constitutional: Negative.   HENT: Negative.    Respiratory: Negative.    Cardiovascular: Negative.   Gastrointestinal: Negative.   Genitourinary: Negative.   Musculoskeletal:        Left plantar aspect foot pain  Skin: Negative.   Neurological: Negative.   All other systems reviewed and are negative.   Updated Vital Signs BP (!) 153/69   Pulse 82   Temp 98.1 F (36.7 C) (Oral)   Resp 16   SpO2 98%   Physical Exam Vitals and nursing  note reviewed.  Constitutional:      General: She is not in acute distress.    Appearance: She is well-developed. She is not ill-appearing, toxic-appearing or diaphoretic.  HENT:     Head: Atraumatic.  Eyes:     Pupils: Pupils are equal, round, and reactive to light.  Cardiovascular:     Rate and Rhythm: Normal rate.     Pulses:          Dorsalis pedis pulses are 2+ on the right side and 2+ on the left side.  Pulmonary:     Effort: No respiratory distress.  Abdominal:     General: There is no distension.  Musculoskeletal:        General: Normal range of motion.  Cervical back: Normal range of motion.     Comments: Diffuse tenderness plantar aspect left foot.  Able to plantarflex, dorsiflex, invert and evert without difficulty.  Nontender ankle, tib-fib.  1+ pitting edema to shins bilateral lower extremities-normal per patient.  No erythema, warmth, fluctuance induration.  Skin:    General: Skin is warm and dry.  Neurological:     General: No focal deficit present.     Mental Status: She is alert.  Psychiatric:        Mood and Affect: Mood normal.     (all labs ordered are listed, but only abnormal results are displayed) Labs Reviewed - No data to display  EKG: None  Radiology: DG Foot Complete Left Result Date: 10/19/2023 CLINICAL DATA:  Left foot pain 3-4 weeks.  No injury. EXAM: LEFT FOOT - COMPLETE 3+ VIEW COMPARISON:  None Available. FINDINGS: No evidence of acute fracture or dislocation. Small inferior calcaneal spur. Subtle degenerative change over the midfoot. Soft tissues unremarkable. IMPRESSION: 1. No acute findings. 2. Small inferior calcaneal spur. Electronically Signed   By: Toribio Agreste M.D.   On: 10/19/2023 18:44     Procedures   Medications Ordered in the ED - No data to display  63 year old here for evaluation of left plantar aspect foot pain which is going on over the last month.  No known trauma or injury.  Here patient is neurovascularly intact.   She states she has some chronic bilateral lower extremity edema which is unchanged.  No erythema, warmth.  No bony tenderness to ankle or tib-fib.  Does have some tenderness over her left calcaneus.  No rashes or lesions.  She has 2+ DP, PT pulses bilaterally.   Imaging personally viewed interpreted X-ray shows calcaneal spur, degenerative changes of the midfoot  Discussed results with patient.  He was given lidocaine patches, follow-up with orthopedics or podiatry.  At this time I low suspicion for acute infectious process, occult fracture, septic joint, Lisfranc injury, compartment syndrome, bacterial infectious process, VTE, ischemia/ PAD, CHF, worsening neuropathy, osteomyelitis, necrotizing infection.  Will have her follow-up with orthopedics, return for new or worsening symptoms.  Ambulatory here.  The patient has been appropriately medically screened and/or stabilized in the ED. I have low suspicion for any other emergent medical condition which would require further screening, evaluation or treatment in the ED or require inpatient management.  Patient is hemodynamically stable and in no acute distress.  Patient able to ambulate in department prior to ED.  Evaluation does not show acute pathology that would require ongoing or additional emergent interventions while in the emergency department or further inpatient treatment.  I have discussed the diagnosis with the patient and answered all questions.  Pain is been managed while in the emergency department and patient has no further complaints prior to discharge.  Patient is comfortable with plan discussed in room and is stable for discharge at this time.  I have discussed strict return precautions for returning to the emergency department.  Patient was encouraged to follow-up with PCP/specialist refer to at discharge.                                    Medical Decision Making Amount and/or Complexity of Data Reviewed External Data Reviewed:  labs, radiology and notes. Radiology: ordered and independent interpretation performed. Decision-making details documented in ED Course.  Risk OTC drugs. Prescription drug management. Decision regarding hospitalization. Diagnosis  or treatment significantly limited by social determinants of health.      Final diagnoses:  Calcaneal spur of left foot    ED Discharge Orders          Ordered    lidocaine (LIDODERM) 5 %  Every 24 hours        10/19/23 1933               Lovelace Cerveny A, PA-C 10/19/23 1948    Simon Lavonia SAILOR, MD 10/19/23 2127

## 2023-10-19 NOTE — ED Triage Notes (Signed)
 L foot pain for 3-4 weeks. No known injury. Ambulatory. Denies loss of sensation or mobility   Reports swelling to BIL feet (baseline)

## 2023-10-21 ENCOUNTER — Encounter: Payer: Self-pay | Admitting: Internal Medicine

## 2023-10-21 ENCOUNTER — Other Ambulatory Visit: Payer: Self-pay | Admitting: Family Medicine

## 2023-10-21 DIAGNOSIS — I10 Essential (primary) hypertension: Secondary | ICD-10-CM

## 2023-11-23 ENCOUNTER — Other Ambulatory Visit: Payer: Self-pay | Admitting: Family Medicine

## 2023-11-23 DIAGNOSIS — R109 Unspecified abdominal pain: Secondary | ICD-10-CM

## 2023-12-06 ENCOUNTER — Other Ambulatory Visit: Payer: Self-pay | Admitting: Family Medicine

## 2023-12-06 DIAGNOSIS — L299 Pruritus, unspecified: Secondary | ICD-10-CM

## 2023-12-08 ENCOUNTER — Other Ambulatory Visit: Payer: Self-pay | Admitting: Family Medicine

## 2023-12-16 ENCOUNTER — Other Ambulatory Visit: Payer: Self-pay | Admitting: Family Medicine

## 2023-12-16 DIAGNOSIS — L299 Pruritus, unspecified: Secondary | ICD-10-CM

## 2023-12-16 DIAGNOSIS — G2581 Restless legs syndrome: Secondary | ICD-10-CM

## 2023-12-31 ENCOUNTER — Other Ambulatory Visit (HOSPITAL_BASED_OUTPATIENT_CLINIC_OR_DEPARTMENT_OTHER): Payer: Self-pay

## 2024-01-01 ENCOUNTER — Other Ambulatory Visit (HOSPITAL_BASED_OUTPATIENT_CLINIC_OR_DEPARTMENT_OTHER): Payer: Self-pay

## 2024-01-04 ENCOUNTER — Other Ambulatory Visit: Payer: Self-pay | Admitting: Family Medicine

## 2024-01-04 DIAGNOSIS — K219 Gastro-esophageal reflux disease without esophagitis: Secondary | ICD-10-CM

## 2024-01-11 LAB — OPHTHALMOLOGY REPORT-SCANNED

## 2024-01-20 ENCOUNTER — Ambulatory Visit: Payer: Self-pay | Admitting: Internal Medicine

## 2024-01-20 ENCOUNTER — Telehealth: Payer: Self-pay

## 2024-01-20 NOTE — Transitions of Care (Post Inpatient/ED Visit) (Signed)
 "  01/20/2024  Name: Cristina Burns MRN: [id number] DOB: July 23, 1960  Today's TOC FU Call Status: Today's TOC FU Call Status:: Successful TOC FU Call Completed TOC FU Call Complete Date: 01/20/24  Patient's Name and Date of Birth confirmed. Name, DOB  Transition Care Management Follow-up Telephone Call Date of Discharge: 01/19/24 Discharge Facility: Other ([facility]) Name of Other ([facility]) Discharge Facility: [facility] Type of Discharge: Inpatient Admission Primary Inpatient Discharge Diagnosis:: fever How have you been since you were released from the hospital?: Better  Items Reviewed: Did you receive and understand the discharge instructions provided?: No Medications obtained,verified, and reconciled?: Yes (Medications Reviewed) Any new allergies since your discharge?: No Dietary orders reviewed?: Yes Do you have support at home?: Yes People in Home [RPT]: child(ren), adult  Medications Reviewed Today: Medications Reviewed Today     Reviewed by Emmitt Pan, LPN (Licensed Practical Nurse) on 01/20/24 at [phone]  Med List Status: <None>   Medication Order Taking? Sig Documenting Provider Last Dose Status Informant  albuterol  (VENTOLIN  HFA) 108 (90 Base) MCG/ACT inhaler [id number] Yes Inhale 1-2 puffs into the lungs every 6 (six) hours as needed for wheezing or shortness of breath. Frann Mabel Mt, DO  Active   amLODipine  (NORVASC ) 5 MG tablet [id number] Yes Take 1 tablet (5 mg total) by mouth daily. Frann Mabel Mt, DO  Active   atorvastatin  (LIPITOR) 40 MG tablet [id number] Yes Take 1 tablet by mouth once daily Frann Mabel Mt, DO  Active   Azelastine  HCl 137 MCG/SPRAY SOLN [id number] Yes Place 1 spray into both nostrils in the morning and at bedtime. Frann Mabel Mt, DO  Active   Continuous Glucose Sensor (DEXCOM G7 SENSOR) [place] [id number] Yes USE AS DIRECTED EVERY  10  DAYS Shamleffer, Ibtehal Jaralla, MD  Active   dicyclomine  (BENTYL ) 10 MG  capsule [id number] Yes TAKE 1 CAPSULE BY MOUTH EVERY 6 HOURS AS NEEDED FOR  SPASMS Wendling, Mabel Mt, DO  Active   famotidine  (PEPCID ) 20 MG tablet [id number] Yes Take 1 tablet by mouth twice daily Frann Mabel Mt, DO  Active   furosemide  (LASIX ) 40 MG tablet [id number] Yes Take 1 tablet by mouth twice daily Frann Mabel Mt, DO  Active   gabapentin  (NEURONTIN ) 600 MG tablet [id number] Yes TAKE 1 TABLET BY MOUTH THREE TIMES DAILY Frann Mabel Mt, DO  Active   glucose blood test strip [id number] Yes 1 each by Other route daily in the afternoon. Use as instructed Shamleffer, Ibtehal Jaralla, MD  Active     Discontinued 12/31/23 1756 (Duplicate)            Med Note>> Vonnie Cough, CPhT   12/31/2023  5:56 PM meant test claim    insulin  lispro (HUMALOG  KWIKPEN) 100 UNIT/ML KwikPen [id number] Yes Inject 24-34 Units into the skin 3 (three) times daily. Shamleffer, Donell Cardinal, MD  Active   Insulin  Pen Needle 32G X 4 MM MISC [id number] Yes 1 Device by Does not apply route in the morning, at noon, in the evening, and at bedtime. Shamleffer, Donell Cardinal, MD  Active   Insulin  Syringes, Disposable, U-100 1 ML MISC [id number] Yes 1 Device by Does not apply route in the morning, at noon, in the evening, and at bedtime. Shamleffer, Ibtehal Jaralla, MD  Active   levocetirizine (XYZAL ) 5 MG tablet [id number] Yes TAKE 1 TABLET BY MOUTH ONCE DAILY IN THE EVENING Frann Mabel Mt, DO  Active   levothyroxine  (SYNTHROID ) 125 MCG tablet [id number] Yes Take 1 tablet (125 mcg total)  by mouth daily. Shamleffer, Donell Cardinal, MD  Active   lidocaine  (LIDODERM ) 5 % [id number] Yes Place 1 patch onto the skin daily. Remove & Discard patch within 12 hours or as directed by MD Henderly, Britni A, PA-C  Active   losartan  (COZAAR ) 50 MG tablet [id number] Yes Take 1 tablet (50 mg total) by mouth daily. Frann Mabel Mt, DO  Active   metFORMIN  (GLUCOPHAGE -XR) 500 MG 24 hr tablet [id number]  Yes Take 1 tablet (500 mg total) by mouth 2 (two) times daily with a meal. Shamleffer, Donell Cardinal, MD  Active   methylphenidate  (RITALIN  LA) 20 MG 24 hr capsule [id number] Yes Take 1 capsule (20 mg total) by mouth every morning. Frann Mabel Mt, DO  Active   methylphenidate  (RITALIN  LA) 20 MG 24 hr capsule [id number] Yes Take 1 capsule (20 mg total) by mouth every morning. Frann Mabel Mt, DO  Active   methylphenidate  (RITALIN  LA) 20 MG 24 hr capsule [id number] Yes Take 1 capsule (20 mg total) by mouth every morning. Frann Mabel Mt, DO  Active   methylphenidate  (RITALIN ) 20 MG tablet [id number] Yes Daily after lunch. Frann Mabel Mt, DO  Active   methylphenidate  (RITALIN ) 20 MG tablet [id number] Yes Take 1 tab in the afternoon daily. Frann Mabel Mt, DO  Active   methylphenidate  (RITALIN ) 20 MG tablet [id number] Yes Take daily after lunch. Frann Mabel Mt, DO  Active   montelukast  (SINGULAIR ) 10 MG tablet [id number] Yes TAKE 1 TABLET BY MOUTH EVERY DAY AT BEDTIME Frann Mabel Mt, DO  Active   multivitamin-iron-minerals-folic acid (CENTRUM) chewable tablet [id number] Yes Chew 1 tablet by mouth daily. [provider]  Active Self  pantoprazole  (PROTONIX ) 40 MG tablet [id number] Yes TAKE 1 TABLET BY MOUTH TWICE DAILY BEFORE A MEAL Wendling, Mabel Mt, DO  Active   rOPINIRole  (REQUIP ) 2 MG tablet [id number] Yes TAKE 1 TABLET BY MOUTH ONCE DAILY IN THE MORNING AND 2 AT BEDTIME Frann Mabel Mt, DO  Active   ropinirole  (REQUIP ) 5 MG tablet [id number] Yes TAKE 1 TABLET BY MOUTH NIGHTLY IN  ADDITION  TO  THE  2MG   IN  THE  AFTERNOON Frann Mabel Mt, DO  Active   triamcinolone  (NASACORT  ALLERGY 24HR) 55 MCG/ACT AERO nasal inhaler [id number] Yes Place 2 sprays into the nose daily. Frann Mabel Mt, DO  Active   triamterene -hydrochlorothiazide (MAXZIDE-25) 37.5-25 MG tablet [id number] Yes Take 1 tablet by mouth once daily Frann Mabel Mt, DO  Active             Home Care and Equipment/Supplies: Were Home Health Services Ordered?: NA Any new equipment or medical supplies ordered?: NA  Functional Questionnaire: Do you need assistance with bathing/showering or dressing?: No Do you need assistance with meal preparation?: No Do you need assistance with eating?: No Do you have difficulty maintaining continence: No Do you need assistance with getting out of bed/getting out of a chair/moving?: No Do you have difficulty managing or taking your medications?: No  Follow up appointments reviewed: PCP Follow-up appointment confirmed?: Yes Date of PCP follow-up appointment?: 01/27/24 Follow-up Provider: [facility] Follow-up appointment confirmed?: NA Do you need transportation to your follow-up appointment?: No Do you understand care options if your condition(s) worsen?: Yes-patient verbalized understanding    SIGNATURE Julian Lemmings, LPN [facility] Nurse Health Advisor Direct Dial  [phone]  "

## 2024-01-27 ENCOUNTER — Encounter: Payer: Self-pay | Admitting: Family Medicine

## 2024-01-27 ENCOUNTER — Ambulatory Visit: Payer: Self-pay | Admitting: Family Medicine

## 2024-01-27 VITALS — BP 122/60 | HR 82 | Temp 98.0°F | Resp 16 | Ht 62.0 in | Wt 223.2 lb

## 2024-01-27 DIAGNOSIS — N179 Acute kidney failure, unspecified: Secondary | ICD-10-CM

## 2024-01-27 DIAGNOSIS — M7051 Other bursitis of knee, right knee: Secondary | ICD-10-CM

## 2024-01-27 MED ORDER — METHYLPREDNISOLONE ACETATE 80 MG/ML IJ SUSP
80.0000 mg | Freq: Once | INTRAMUSCULAR | Status: AC
  Administered 2024-01-27: 80 mg via INTRAMUSCULAR

## 2024-01-27 NOTE — Progress Notes (Signed)
 Chief Complaint  Patient presents with   Follow-up    Hospital follow up    Subjective: Patient is a 64 y.o. female here for f/u.  Patient was admitted to [facility] hospital on 12/26 and discharged on 12/30 for acute respiratory failure secondary to influenza A.  She completed her course of Tamiflu.  She was found to have acute kidney injury.  She has started to swell again.  She has not been taking Lasix  due to her kidney function.  Exercise is limited.  Strength is coming back.  She is eating and drinking normally again.  Has a follow-up with the endocrinology team later in the month.  Of note, she has a several month history of pain over her medial right knee.  No injury or change in activity. Sharp pain.  Has been using ice.  Past Medical History:  Diagnosis Date   ADHD (attention deficit hyperactivity disorder)    Allergic rhinitis    Allergy    Diabetes mellitus without complication (HCC)    Diabetic neuropathy (HCC)    GERD (gastroesophageal reflux disease)    Hypertension    Hypothyroidism    RLS (restless legs syndrome)    Stomach ulcer it's been a long time    Objective: BP 122/60 (BP Location: Left Arm, Patient Position: Sitting, Cuff Size: Large)   Pulse 82   Temp 98 F (36.7 C) (Oral)   Resp 16   Ht 5' 2 (1.575 m)   Wt 223 lb 3.2 oz (101.2 kg)   SpO2 95%   BMI 40.82 kg/m  General: Awake, appears stated age Heart: RRR, no LE edema Lungs: CTAB, no rales, wheezes or rhonchi. No accessory muscle use MSK: TTP over the Pez anserine bursa on the right knee.  Normal active and passive range of motion.  No edema, excessive warmth, or erythema Psych: Age appropriate judgment and insight, normal affect and mood  Assessment and Plan: AKI (acute kidney injury) - Plan: Comprehensive metabolic panel with GFR, CBC  Pes anserinus bursitis of right knee - Plan: methylPREDNISolone  acetate (DEPO-MEDROL ) injection 80 mg  Follow-up on labs.  Probably resolved,  cannot restart Lasix  until having follow-up blood work. Depo-Medrol  injection today.  Ice, stretches and exercises, cortisone injection in the bursa if no improvement in the next few weeks. Flu A symptoms have resolved. The patient voiced understanding and agreement to the plan.  I spent 30 minutes with the patient discussing the above plans in addition to reviewing her chart/hospitalization on the same day of the visit.  Mabel Mt [place], DO 01/27/2024  4:43 PM

## 2024-01-27 NOTE — Patient Instructions (Addendum)
 Give us  2-3 business days to get the results of your labs back.   Keep the diet clean and stay active.  Heat (pad or rice pillow in microwave) over affected area, 10-15 minutes twice daily.   Ice/cold pack over area for 10-15 min twice daily.  Let us  know if you need anything.  Knee Exercises It is normal to feel mild stretching, pulling, tightness, or discomfort as you do these exercises, but you should stop right away if you feel sudden pain or your pain gets worse.  STRETCHING AND RANGE OF MOTION EXERCISES  These exercises warm up your muscles and joints and improve the movement and flexibility of your knee. These exercises also help to relieve pain, numbness, and tingling. Exercise A: Knee Extension, Prone  Lie on your abdomen on a bed. Place your left / right knee just beyond the edge of the surface so your knee is not on the bed. You can put a towel under your left / right thigh just above your knee for comfort. Relax your leg muscles and allow gravity to straighten your knee. You should feel a stretch behind your left / right knee. Hold this position for 30 seconds. Scoot up so your knee is supported between repetitions. Repeat 2 times. Complete this stretch 3 times per week. Exercise B: Knee Flexion, Active     Lie on your back with both knees straight. If this causes back discomfort, bend your left / right knee so your foot is flat on the floor. Slowly slide your left / right heel back toward your buttocks until you feel a gentle stretch in the front of your knee or thigh. Hold this position for 30 seconds. Slowly slide your left / right heel back to the starting position. Repeat 2 times. Complete this exercise 3 times per week. Exercise C: Quadriceps, Prone     Lie on your abdomen on a firm surface, such as a bed or padded floor. Bend your left / right knee and hold your ankle. If you cannot reach your ankle or pant leg, loop a belt around your foot and grab the belt  instead. Gently pull your heel toward your buttocks. Your knee should not slide out to the side. You should feel a stretch in the front of your thigh and knee. Hold this position for 30 seconds. Repeat 2 times. Complete this stretch 3 times per week. Exercise D: Hamstring, Supine  Lie on your back. Loop a belt or towel over the ball of your left / right foot. The ball of your foot is on the walking surface, right under your toes. Straighten your left / right knee and slowly pull on the belt to raise your leg until you feel a gentle stretch behind your knee. Do not let your left / right knee bend while you do this. Keep your other leg flat on the floor. Hold this position for 30 seconds. Repeat 2 times. Complete this stretch 3 times per week. STRENGTHENING EXERCISES  These exercises build strength and endurance in your knee. Endurance is the ability to use your muscles for a long time, even after they get tired. Exercise E: Quadriceps, Isometric     Lie on your back with your left / right leg extended and your other knee bent. Put a rolled towel or small pillow under your knee if told by your health care provider. Slowly tense the muscles in the front of your left / right thigh. You should see your kneecap slide  up toward your hip or see increased dimpling just above the knee. This motion will push the back of the knee toward the floor. For 3 seconds, keep the muscle as tight as you can without increasing your pain. Relax the muscles slowly and completely. Repeat for 10 total reps Repeat 2 ti mes. Complete this exercise 3 times per week. Exercise F: Straight Leg Raises - Quadriceps  Lie on your back with your left / right leg extended and your other knee bent. Tense the muscles in the front of your left / right thigh. You should see your kneecap slide up or see increased dimpling just above the knee. Your thigh may even shake a bit. Keep these muscles tight as you raise your leg 4-6 inches  (10-15 cm) off the floor. Do not let your knee bend. Hold this position for 3 seconds. Keep these muscles tense as you lower your leg. Relax your muscles slowly and completely after each repetition. 10 total reps. Repeat 2 times. Complete this exercise 3 times per week.  Exercise G: Hamstring Curls     If told by your health care provider, do this exercise while wearing ankle weights. Begin with 5 lb weights (optional). Then increase the weight by 1 lb (0.5 kg) increments. Do not wear ankle weights that are more than 20 lbs to start with. Lie on your abdomen with your legs straight. Bend your left / right knee as far as you can without feeling pain. Keep your hips flat against the floor. Hold this position for 3 seconds. Slowly lower your leg to the starting position. Repeat for 10 reps.  Repeat 2 times. Complete this exercise 3 times per week. Exercise H: Squats (Quadriceps)  Stand in front of a table, with your feet and knees pointing straight ahead. You may rest your hands on the table for balance but not for support. Slowly bend your knees and lower your hips like you are going to sit in a chair. Keep your weight over your heels, not over your toes. Keep your lower legs upright so they are parallel with the table legs. Do not let your hips go lower than your knees. Do not bend lower than told by your health care provider. If your knee pain increases, do not bend as low. Hold the squat position for 1 second. Slowly push with your legs to return to standing. Do not use your hands to pull yourself to standing. Repeat 2 times. Complete this exercise 3 times per week. Exercise I: Wall Slides (Quadriceps)     Lean your back against a smooth wall or door while you walk your feet out 18-24 inches (46-61 cm) from it. Place your feet hip-width apart. Slowly slide down the wall or door until your knees Repeat 2 times. Complete this exercise every other day. Exercise K: Straight Leg Raises -  Hip Abductors  Lie on your side with your left / right leg in the top position. Lie so your head, shoulder, knee, and hip line up. You may bend your bottom knee to help you keep your balance. Roll your hips slightly forward so your hips are stacked directly over each other and your left / right knee is facing forward. Leading with your heel, lift your top leg 4-6 inches (10-15 cm). You should feel the muscles in your outer hip lifting. Do not let your foot drift forward. Do not let your knee roll toward the ceiling. Hold this position for 3 seconds. Slowly return your  leg to the starting position. Let your muscles relax completely after each repetition. 10 total reps. Repeat 2 times. Complete this exercise 3 times per week. Exercise J: Straight Leg Raises - Hip Extensors  Lie on your abdomen on a firm surface. You can put a pillow under your hips if that is more comfortable. Tense the muscles in your buttocks and lift your left / right leg about 4-6 inches (10-15 cm). Keep your knee straight as you lift your leg. Hold this position for 3 seconds. Slowly lower your leg to the starting position. Let your leg relax completely after each repetition. Repeat 2 times. Complete this exercise 3 times per week. Document Released: 11/20/2004 Document Revised: 10/01/2015 Document Reviewed: 11/12/2014 Elsevier Interactive Patient Education  2017 [employer].

## 2024-01-28 ENCOUNTER — Ambulatory Visit: Payer: Self-pay | Admitting: Family Medicine

## 2024-01-28 DIAGNOSIS — D696 Thrombocytopenia, unspecified: Secondary | ICD-10-CM

## 2024-01-28 LAB — COMPREHENSIVE METABOLIC PANEL WITH GFR
ALT: 50 U/L — ABNORMAL HIGH (ref 3–35)
AST: 40 U/L — ABNORMAL HIGH (ref 5–37)
Albumin: 3.7 g/dL (ref 3.5–5.2)
Alkaline Phosphatase: 87 U/L (ref 39–117)
BUN: 29 mg/dL — ABNORMAL HIGH (ref 6–23)
CO2: 27 meq/L (ref 19–32)
Calcium: 9.6 mg/dL (ref 8.4–10.5)
Chloride: 105 meq/L (ref 96–112)
Creatinine, Ser: 1.28 mg/dL — ABNORMAL HIGH (ref 0.40–1.20)
GFR: 44.59 mL/min — ABNORMAL LOW
Glucose, Bld: 251 mg/dL — ABNORMAL HIGH (ref 70–99)
Potassium: 4.9 meq/L (ref 3.5–5.1)
Sodium: 140 meq/L (ref 135–145)
Total Bilirubin: 0.3 mg/dL (ref 0.2–1.2)
Total Protein: 6.7 g/dL (ref 6.0–8.3)

## 2024-01-28 LAB — CBC
HCT: 35.6 % — ABNORMAL LOW (ref 36.0–46.0)
Hemoglobin: 12.1 g/dL (ref 12.0–15.0)
MCHC: 34 g/dL (ref 30.0–36.0)
MCV: 85.2 fl (ref 78.0–100.0)
Platelets: 451 K/uL — ABNORMAL HIGH (ref 150.0–400.0)
RBC: 4.18 Mil/uL (ref 3.87–5.11)
RDW: 17.1 % — ABNORMAL HIGH (ref 11.5–15.5)
WBC: 6.8 K/uL (ref 4.0–10.5)

## 2024-01-29 ENCOUNTER — Telehealth: Payer: Self-pay

## 2024-01-29 NOTE — Telephone Encounter (Signed)
 Called pt and notified her of results in more depth due to her not understanding. Also scheduled her a 3 week lab fu    Copied from CRM #[id number]. Topic: Clinical - Lab/Test Results >> Jan 29, 2024 11:21 AM Victoria A wrote: Reason for CRM: Patient called and said she would like to know what her labs results are before there is a 3 week follow up appointment is scheduled Please call at [phone]

## 2024-01-30 ENCOUNTER — Other Ambulatory Visit: Payer: Self-pay | Admitting: Family Medicine

## 2024-01-30 ENCOUNTER — Other Ambulatory Visit: Payer: Self-pay | Admitting: Internal Medicine

## 2024-01-30 DIAGNOSIS — G2581 Restless legs syndrome: Secondary | ICD-10-CM

## 2024-02-09 ENCOUNTER — Other Ambulatory Visit: Payer: Self-pay

## 2024-02-09 ENCOUNTER — Ambulatory Visit: Payer: Self-pay | Admitting: Internal Medicine

## 2024-02-09 ENCOUNTER — Encounter: Payer: Self-pay | Admitting: Internal Medicine

## 2024-02-09 VITALS — BP 146/78 | Ht 62.0 in | Wt 224.0 lb

## 2024-02-09 DIAGNOSIS — Z7984 Long term (current) use of oral hypoglycemic drugs: Secondary | ICD-10-CM

## 2024-02-09 DIAGNOSIS — E1142 Type 2 diabetes mellitus with diabetic polyneuropathy: Secondary | ICD-10-CM

## 2024-02-09 DIAGNOSIS — E039 Hypothyroidism, unspecified: Secondary | ICD-10-CM

## 2024-02-09 DIAGNOSIS — Z794 Long term (current) use of insulin: Secondary | ICD-10-CM

## 2024-02-09 DIAGNOSIS — E1165 Type 2 diabetes mellitus with hyperglycemia: Secondary | ICD-10-CM

## 2024-02-09 LAB — POCT GLYCOSYLATED HEMOGLOBIN (HGB A1C): Hemoglobin A1C: 9.2 % — AB (ref 4.0–5.6)

## 2024-02-09 NOTE — Patient Instructions (Addendum)
" °-   Continue Metformin  500 mg 1 tabs Twice a day  - Toujeo  65 units twice daily  - Humalog  25 units with each meal  - Humalog  10 units with a snack  - Humalog   correctional insulin : ADD extra units on insulin  to your meal-time Humalog   dose if your blood sugars are higher than 150. Use the scale below to help guide you:   Blood sugar before meal Number of units to inject  Less than 135 0 unit  136 - 150 1 units  151 - 165 2 units  166 - 180 3 units  181 - 195 4 units  196 - 210 5 units  211 - 225 6 units  226 - 240 7 units   241 - 255 8 units   256 - 270 9 units   271 - 285 10 units  286 - 300 11 units   301 - 315 12 units      HOW TO TREAT LOW BLOOD SUGARS (Blood sugar LESS THAN 70 MG/DL) Please follow the RULE OF 15 for the treatment of hypoglycemia treatment (when your (blood sugars are less than 70 mg/dL)   STEP 1: Take 15 grams of carbohydrates when your blood sugar is low, which includes:  3-4 GLUCOSE TABS  OR 3-4 OZ OF JUICE OR REGULAR SODA OR ONE TUBE OF GLUCOSE GEL    STEP 2: RECHECK blood sugar in 15 MINUTES STEP 3: If your blood sugar is still low at the 15 minute recheck --> then, go back to STEP 1 and treat AGAIN with another 15 grams of carbohydrates. "

## 2024-02-09 NOTE — Progress Notes (Unsigned)
 "   Name: Cristina Burns  Age/ Sex: 64 y.o., female   MRN/ DOB: [id number], 10-28-1960     PCP: Frann Mabel Mt, DO   Reason for Endocrinology Evaluation: Type 2 Diabetes Mellitus  Initial Endocrine Consultative Visit: 05/08/2020    PATIENT IDENTIFIER: [name] is a 64 y.o. female with a past medical history of T2DM, RLS, Hypothyroidism and HTN. The patient has followed with Endocrinology clinic since 05/08/2020 for consultative assistance with management of her diabetes.    DIABETIC HISTORY:  Ms. Bicknell was diagnosed with DM in 2000, Trulicity - stomach issues. Invokana- recurrent yeast infections. Her hemoglobin A1c has ranged from  7.2%in 2021, peaking at 10.6% in 2018.   On her initial visit to our clinic her A1c 7.8%, she was on Glimepiride , MDI regimen and Ozempic . We stopped Glimepiride , continued Metfomrin,MDI regimen and decreased Ozempic  due to nausea    Invokana caused  yeast infection   She stopped Ozempic  it by her visit in 09/2020 She was trained on OmniPod use 09/2021 but was unable to sustain it due to cost issues  Started Mounjaro  02/2022 but this was discontinued due to abdominal pain  We decreased  metformin  by 50% due to low GFR  She was provided with pt assistance forms in 09/2023 for insulin  due to lack of insurance   SUBJECTIVE:   During the last visit (10/14/2023): A1c 11.5%    Today (02/09/2024): [name]  is here for a follow up on diabetes management. She checks her blood sugars 3 times daily .   She did have nausea a couple of days ago  She was hospitalized in 12/2023 for influenza  Continues with changes in bowel movement  She is having knee and balance issues , as well as chronic hip issues Patient does endorse abnormal sensation of the toes  HOME DIABETES REGIMEN:  Metformin  500 mg XR 1 tabs twice daily Toujeo  66 units TWICE daily  Humalog   25 units TIDQAC CF: Humalog    (BG-140/15)    Statin: yes ACE-I/ARB: yes Prior  Diabetic Education: yes   CGM DATA:                                     DIABETIC COMPLICATIONS: Microvascular complications:  Neuropathy Denies: CKD, retinopathy Last Eye Exam: Completed 01/11/2024  Macrovascular complications:   Denies: CAD, CVA, PVD   HISTORY:  Past Medical History:  Past Medical History:  Diagnosis Date   ADHD (attention deficit hyperactivity disorder)    Allergic rhinitis    Allergy    Diabetes mellitus without complication (HCC)    Diabetic neuropathy (HCC)    GERD (gastroesophageal reflux disease)    Hypertension    Hypothyroidism    RLS (restless legs syndrome)    Stomach ulcer it's been a long time   Past Surgical History:  Past Surgical History:  Procedure Laterality Date   ABDOMINAL HYSTERECTOMY  2002   ACHILLES TENDON REPAIR  2009   CHOLECYSTECTOMY  1982   ROTATOR CUFF REPAIR Right 2011   Social History:  reports that she quit smoking about 41 years ago. Her smoking use included cigarettes. She started smoking about 50 years ago. She has a 18 pack-year smoking history. She has never used smokeless tobacco. She reports that she does not drink alcohol and does not use drugs. Family History:  Family History  Problem Relation Age of Onset  Heart disease Father    Heart attack Father    Heart disease Brother        CABG   Diabetes Brother    Kidney disease Brother    Cirrhosis Brother    Hypertension Brother    Heart attack Brother    Heart disease Brother    Diabetes Brother    Heart attack Brother    Diabetes Mother    Breast cancer Mother    Stroke Mother      HOME MEDICATIONS: Allergies as of 02/09/2024       Reactions   Codeine Hives   Darvon [propoxyphene] Hives   Darvocet   Doxycycline     Double Vision   Erythromycin    Stomach pain   Keflex [cephalexin] Hives        Medication List        Accurate as of February 09, 2024  1:34 PM. If you have any questions, ask your  nurse or doctor.          rOPINIRole  2 MG tablet Commonly known as: REQUIP  TAKE 1 TABLET BY MOUTH ONCE DAILY IN THE MORNING AND 2 AT BEDTIME The timing of this medication is very important.   ropinirole  5 MG tablet Commonly known as: REQUIP  TAKE 1 TABLET BY MOUTH NIGHTLY IN  ADDITION  TO  THE  2MG   IN  THE  AFTERNOON The timing of this medication is very important.   albuterol  108 (90 Base) MCG/ACT inhaler Commonly known as: VENTOLIN  HFA Inhale 1-2 puffs into the lungs every 6 (six) hours as needed for wheezing or shortness of breath.   amLODipine  5 MG tablet Commonly known as: NORVASC  Take 1 tablet (5 mg total) by mouth daily.   atorvastatin  40 MG tablet Commonly known as: LIPITOR Take 1 tablet by mouth once daily   Azelastine  HCl 137 MCG/SPRAY Soln Place 1 spray into both nostrils in the morning and at bedtime.   Dexcom G7 Sensor Misc USE AS DIRECTED EVERY  10  DAYS   dicyclomine  10 MG capsule Commonly known as: BENTYL  TAKE 1 CAPSULE BY MOUTH EVERY 6 HOURS AS NEEDED FOR  SPASMS   famotidine  20 MG tablet Commonly known as: PEPCID  Take 1 tablet by mouth twice daily   furosemide  40 MG tablet Commonly known as: LASIX  Take 1 tablet by mouth twice daily   gabapentin  600 MG tablet Commonly known as: NEURONTIN  TAKE 1 TABLET BY MOUTH THREE TIMES DAILY   glucose blood test strip 1 each by Other route daily in the afternoon. Use as instructed   insulin  lispro 100 UNIT/ML KwikPen Commonly known as: HumaLOG  KwikPen Inject 24-34 Units into the skin 3 (three) times daily.   Insulin  Pen Needle 32G X 4 MM Misc 1 Device by Does not apply route in the morning, at noon, in the evening, and at bedtime.   Insulin  Syringes (Disposable) U-100 1 ML Misc 1 Device by Does not apply route in the morning, at noon, in the evening, and at bedtime.   levocetirizine 5 MG tablet Commonly known as: XYZAL  TAKE 1 TABLET BY MOUTH ONCE DAILY IN THE EVENING   levothyroxine  125 MCG  tablet Commonly known as: SYNTHROID  Take 1 tablet by mouth once daily   lidocaine  5 % Commonly known as: Lidoderm  Place 1 patch onto the skin daily. Remove & Discard patch within 12 hours or as directed by MD   losartan  50 MG tablet Commonly known as: COZAAR  Take 1 tablet (50 mg total) by  mouth daily.   metFORMIN  500 MG 24 hr tablet Commonly known as: GLUCOPHAGE -XR Take 1 tablet (500 mg total) by mouth 2 (two) times daily with a meal.   methylphenidate  20 MG tablet Commonly known as: Ritalin  Daily after lunch.   methylphenidate  20 MG 24 hr capsule Commonly known as: RITALIN  LA Take 1 capsule (20 mg total) by mouth every morning.   methylphenidate  20 MG tablet Commonly known as: RITALIN  Take daily after lunch.   methylphenidate  20 MG 24 hr capsule Commonly known as: RITALIN  LA Take 1 capsule (20 mg total) by mouth every morning.   methylphenidate  20 MG tablet Commonly known as: RITALIN  Take 1 tab in the afternoon daily.   methylphenidate  20 MG 24 hr capsule Commonly known as: RITALIN  LA Take 1 capsule (20 mg total) by mouth every morning.   montelukast  10 MG tablet Commonly known as: SINGULAIR  TAKE 1 TABLET BY MOUTH EVERY DAY AT BEDTIME   multivitamin-iron-minerals-folic acid chewable tablet Chew 1 tablet by mouth daily.   pantoprazole  40 MG tablet Commonly known as: PROTONIX  TAKE 1 TABLET BY MOUTH TWICE DAILY BEFORE A MEAL   triamcinolone  55 MCG/ACT Aero nasal inhaler Commonly known as: Nasacort  Allergy 24HR Place 2 sprays into the nose daily.   triamterene -hydrochlorothiazide 37.5-25 MG tablet Commonly known as: MAXZIDE-25 Take 1 tablet by mouth once daily         OBJECTIVE:   Vital Signs: BP (!) 146/78   Ht 5' 2 (1.575 m)   Wt 224 lb (101.6 kg)   BMI 40.97 kg/m   Wt Readings from Last 3 Encounters:  02/09/24 224 lb (101.6 kg)  01/27/24 223 lb 3.2 oz (101.2 kg)  10/14/23 227 lb (103 kg)     Exam: General: Pt appears well and is in NAD   Lungs: Clear with good BS bilat   Heart: RRR   Extremities: 1+ pretibial edema.   Neuro: MS is good with appropriate affect, pt is alert and Ox3    DM foot exam: 02/09/2024  The skin of the feet is intact without sores or ulcerations. The pedal pulses are 2+ on right and 2+ on left. The sensation is intact to a screening 5.07, 10 gram monofilament bilaterally        DATA REVIEWED:  Lab Results  Component Value Date   HGBA1C 11.6 (A) 10/14/2023   HGBA1C 11.5 (A) 07/08/2023   HGBA1C 8.9 (A) 03/05/2023    Latest Reference Range & Units 02/09/24 14:07  TSH 0.40 - 4.50 mIU/L 9.52 (H)  (H): Data is abnormally high    Latest Reference Range & Units 01/27/24 16:28  Sodium 135 - 145 mEq/L 140  Potassium 3.5 - 5.1 mEq/L 4.9  Chloride 96 - 112 mEq/L 105  CO2 19 - 32 mEq/L 27  Glucose 70 - 99 mg/dL 748 (H)  BUN 6 - 23 mg/dL 29 (H)  Creatinine 9.59 - 1.20 mg/dL 8.71 (H)  Calcium  8.4 - 10.5 mg/dL 9.6  Alkaline Phosphatase 39 - 117 U/L 87  Albumin 3.5 - 5.2 g/dL 3.7  AST 5 - 37 U/L 40 (H)  ALT 3 - 35 U/L 50 (H)  Total Protein 6.0 - 8.3 g/dL 6.7  Total Bilirubin 0.2 - 1.2 mg/dL 0.3  GFR >39.99 mL/min 44.59 (L)    Old records , labs and images have been reviewed.   ASSESSMENT / PLAN / RECOMMENDATIONS:   1) Type 2 Diabetes Mellitus, Poorly  controlled, With Neuropathic  complications - Most recent A1c of 9.2%.  Goal A1c < 7.0 %.    -A1c is trending down but continues to be above goal -Due to lack of health insurance she was provided with patient assistance papers in September, 2025 but she has not been able to fill them out yet - Intolerant to Ozempic  0.25 mg dose , and Mounjaro  2.5 mg  -She is intolerant to Invokana due to recurrent yeast infections, she is scared of trying other alternatives of SGLT2 inhibitors. -She was trained on OmniPod but unfortunately it was cost prohibitive, tandem is cost prohibitive as well.   -I had to decrease her metformin  due to a low GFR <45   - I have reviewed Dexcom download, patient has been noted with insulin -carbohydrate mismatch, no changes to insulin  regimen at this time except she was advised to take 10 units of Humalog  with snacks, this is based on the fact that she had a popcorn bag last night and her BG's were 275 MGs/DL for approximately 6 hours overnight   MEDICATIONS:   Continue metformin  500 mg XR 1 tabs twice daily Continue Toujeo  66 units twice daily Continue Humalog  25 units 3 times daily before every meal Take Humalog  10 units with a snack Change  correction factor: Humalog  (BG -140/15) before each meal   EDUCATION / INSTRUCTIONS: BG monitoring instructions: Patient is instructed to check her blood sugars 3 times a day, before meals. Call [facility] if: BG persistently < 70  I reviewed the Rule of 15 for the treatment of hypoglycemia in detail with the patient. Literature supplied.    2) Diabetic complications:  Eye: Does not  have known diabetic retinopathy.  Neuro/ Feet: Does  have known diabetic peripheral neuropathy .  Renal: Patient does not have known baseline CKD. She   is  on an ACEI/ARB at present.    3) Hypothyroidism:  -Patient is clinically euthyroid - TSH is elevated, will increase levothyroxine  and recheck on next visit  Medication  Stop levothyroxine  125 mcg daily Start levothyroxine  150 mcg daily  4) Screening for Cushing syndrome:  -Patient with extreme insulin  resistance - We initially postponed this due to lack of health insurance and to save on cost, the patient opted to have this tested, but she did receive dexamethasone injection last week, which I did explain to the patient this will alter her results and we will recheck at the next visit    F/U in 3 months     Signed electronically by: Stefano Redgie Butts, MD  [facility]  [facility] [address] [place] [place], [place] [address] Phone: [phone] FAX:  [phone]   CC: Frann Mabel Mt, DO [address] [place] [address] Phone: [phone]  Fax: [phone]  Return to Endocrinology clinic as below: Future Appointments  Date Time Provider Department Center  02/17/2024  3:30 PM [facility] [address]         "

## 2024-02-10 ENCOUNTER — Ambulatory Visit: Payer: Self-pay | Admitting: Internal Medicine

## 2024-02-10 LAB — TSH: TSH: 9.52 m[IU]/L — ABNORMAL HIGH (ref 0.40–4.50)

## 2024-02-10 MED ORDER — LEVOTHYROXINE SODIUM 150 MCG PO TABS: 150.0000 ug | ORAL_TABLET | Freq: Every day | ORAL | 3 refills | Status: AC

## 2024-02-17 ENCOUNTER — Other Ambulatory Visit: Payer: Self-pay

## 2024-02-18 ENCOUNTER — Other Ambulatory Visit: Payer: Self-pay

## 2024-02-18 DIAGNOSIS — D696 Thrombocytopenia, unspecified: Secondary | ICD-10-CM

## 2024-02-19 ENCOUNTER — Ambulatory Visit: Payer: Self-pay | Admitting: Family Medicine

## 2024-02-19 LAB — CBC WITH DIFFERENTIAL/PLATELET
Basophils Absolute: 0.1 10*3/uL (ref 0.0–0.1)
Basophils Relative: 1 % (ref 0.0–3.0)
Eosinophils Absolute: 0.2 10*3/uL (ref 0.0–0.7)
Eosinophils Relative: 2.2 % (ref 0.0–5.0)
HCT: 39.6 % (ref 36.0–46.0)
Hemoglobin: 12.8 g/dL (ref 12.0–15.0)
Lymphocytes Relative: 28.4 % (ref 12.0–46.0)
Lymphs Abs: 2.4 10*3/uL (ref 0.7–4.0)
MCHC: 32.3 g/dL (ref 30.0–36.0)
MCV: 87.6 fl (ref 78.0–100.0)
Monocytes Absolute: 0.8 10*3/uL (ref 0.1–1.0)
Monocytes Relative: 9.4 % (ref 3.0–12.0)
Neutro Abs: 5 10*3/uL (ref 1.4–7.7)
Neutrophils Relative %: 59 % (ref 43.0–77.0)
Platelets: 338 10*3/uL (ref 150.0–400.0)
RBC: 4.52 Mil/uL (ref 3.87–5.11)
RDW: 15.9 % — ABNORMAL HIGH (ref 11.5–15.5)
WBC: 8.5 10*3/uL (ref 4.0–10.5)

## 2024-02-24 ENCOUNTER — Other Ambulatory Visit: Payer: Self-pay | Admitting: Family Medicine

## 2024-02-24 DIAGNOSIS — Z794 Long term (current) use of insulin: Secondary | ICD-10-CM

## 2024-02-24 NOTE — Telephone Encounter (Signed)
 OK to sched her in 3 mo for a med ck. Will need updated UDS and CSC unless she's stopped taking the Ritalin .

## 2024-06-08 ENCOUNTER — Ambulatory Visit: Payer: Self-pay | Admitting: Internal Medicine
